# Patient Record
Sex: Male | Born: 1965
Health system: Midwestern US, Community
[De-identification: ages and names within clinical notes are randomized; demographics above are authoritative.]

## PROBLEM LIST (undated history)

## (undated) DIAGNOSIS — F4310 Post-traumatic stress disorder, unspecified: Secondary | ICD-10-CM

## (undated) DIAGNOSIS — K219 Gastro-esophageal reflux disease without esophagitis: Secondary | ICD-10-CM

## (undated) DIAGNOSIS — F329 Major depressive disorder, single episode, unspecified: Secondary | ICD-10-CM

## (undated) DIAGNOSIS — F32A Depression, unspecified: Secondary | ICD-10-CM

## (undated) DIAGNOSIS — N400 Enlarged prostate without lower urinary tract symptoms: Secondary | ICD-10-CM

## (undated) DIAGNOSIS — N419 Inflammatory disease of prostate, unspecified: Secondary | ICD-10-CM

## (undated) DIAGNOSIS — M961 Postlaminectomy syndrome, not elsewhere classified: Secondary | ICD-10-CM

## (undated) DIAGNOSIS — S32009A Unspecified fracture of unspecified lumbar vertebra, initial encounter for closed fracture: Principal | ICD-10-CM

## (undated) DIAGNOSIS — S56511A Strain of other extensor muscle, fascia and tendon at forearm level, right arm, initial encounter: Secondary | ICD-10-CM

## (undated) DIAGNOSIS — Z96642 Presence of left artificial hip joint: Principal | ICD-10-CM

## (undated) DIAGNOSIS — E785 Hyperlipidemia, unspecified: Principal | ICD-10-CM

## (undated) DIAGNOSIS — M87051 Idiopathic aseptic necrosis of right femur: Secondary | ICD-10-CM

## (undated) DIAGNOSIS — Z01812 Encounter for preprocedural laboratory examination: Principal | ICD-10-CM

## (undated) DIAGNOSIS — M87052 Idiopathic aseptic necrosis of left femur: Secondary | ICD-10-CM

## (undated) DIAGNOSIS — Z539 Procedure and treatment not carried out, unspecified reason: Secondary | ICD-10-CM

## (undated) DIAGNOSIS — Z981 Arthrodesis status: Secondary | ICD-10-CM

## (undated) DIAGNOSIS — Z01818 Encounter for other preprocedural examination: Secondary | ICD-10-CM

## (undated) DIAGNOSIS — M1612 Unilateral primary osteoarthritis, left hip: Secondary | ICD-10-CM

## (undated) DIAGNOSIS — M5020 Other cervical disc displacement, unspecified cervical region: Secondary | ICD-10-CM

## (undated) DIAGNOSIS — G5602 Carpal tunnel syndrome, left upper limb: Secondary | ICD-10-CM

## (undated) DIAGNOSIS — Z9889 Other specified postprocedural states: Principal | ICD-10-CM

## (undated) HISTORY — PX: JOINT REPLACEMENT: SHX530

## (undated) HISTORY — PX: KNEE SURGERY: SHX244

---

## 1998-06-10 ENCOUNTER — Encounter: Payer: Self-pay | Admitting: *Deleted

## 1998-06-10 ENCOUNTER — Emergency Department (HOSPITAL_COMMUNITY): Admission: EM | Admit: 1998-06-10 | Discharge: 1998-06-10 | Payer: Self-pay | Admitting: Emergency Medicine

## 1998-09-16 ENCOUNTER — Emergency Department (HOSPITAL_COMMUNITY): Admission: EM | Admit: 1998-09-16 | Discharge: 1998-09-16 | Payer: Self-pay | Admitting: Emergency Medicine

## 1998-09-27 ENCOUNTER — Emergency Department (HOSPITAL_COMMUNITY): Admission: EM | Admit: 1998-09-27 | Discharge: 1998-09-27 | Payer: Self-pay | Admitting: Emergency Medicine

## 1999-04-13 ENCOUNTER — Emergency Department (HOSPITAL_COMMUNITY): Admission: EM | Admit: 1999-04-13 | Discharge: 1999-04-13 | Payer: Self-pay | Admitting: Emergency Medicine

## 1999-04-14 ENCOUNTER — Encounter: Payer: Self-pay | Admitting: Emergency Medicine

## 1999-04-20 ENCOUNTER — Inpatient Hospital Stay (HOSPITAL_COMMUNITY): Admission: EM | Admit: 1999-04-20 | Discharge: 1999-04-22 | Payer: Self-pay | Admitting: Emergency Medicine

## 1999-04-20 ENCOUNTER — Encounter: Payer: Self-pay | Admitting: Cardiovascular Disease

## 1999-04-22 ENCOUNTER — Encounter: Payer: Self-pay | Admitting: Cardiovascular Disease

## 1999-08-14 ENCOUNTER — Ambulatory Visit (HOSPITAL_COMMUNITY): Admission: RE | Admit: 1999-08-14 | Discharge: 1999-08-14 | Payer: Self-pay | Admitting: *Deleted

## 1999-08-14 ENCOUNTER — Encounter (INDEPENDENT_AMBULATORY_CARE_PROVIDER_SITE_OTHER): Payer: Self-pay | Admitting: Specialist

## 2000-02-21 ENCOUNTER — Encounter: Payer: Self-pay | Admitting: Emergency Medicine

## 2000-02-21 ENCOUNTER — Emergency Department (HOSPITAL_COMMUNITY): Admission: EM | Admit: 2000-02-21 | Discharge: 2000-02-21 | Payer: Self-pay | Admitting: Emergency Medicine

## 2000-11-25 ENCOUNTER — Emergency Department (HOSPITAL_COMMUNITY): Admission: EM | Admit: 2000-11-25 | Discharge: 2000-11-25 | Payer: Self-pay

## 2001-04-30 ENCOUNTER — Emergency Department (HOSPITAL_COMMUNITY): Admission: EM | Admit: 2001-04-30 | Discharge: 2001-04-30 | Payer: Self-pay | Admitting: Physical Therapy

## 2001-11-27 ENCOUNTER — Emergency Department (HOSPITAL_COMMUNITY): Admission: EM | Admit: 2001-11-27 | Discharge: 2001-11-27 | Payer: Self-pay | Admitting: Emergency Medicine

## 2002-01-13 ENCOUNTER — Encounter: Payer: Self-pay | Admitting: Gastroenterology

## 2002-01-13 ENCOUNTER — Encounter: Admission: RE | Admit: 2002-01-13 | Discharge: 2002-01-13 | Payer: Self-pay | Admitting: Gastroenterology

## 2002-02-17 ENCOUNTER — Encounter: Admission: RE | Admit: 2002-02-17 | Discharge: 2002-02-17 | Payer: Self-pay | Admitting: Gastroenterology

## 2002-02-17 ENCOUNTER — Encounter: Payer: Self-pay | Admitting: Gastroenterology

## 2002-04-14 ENCOUNTER — Ambulatory Visit (HOSPITAL_COMMUNITY): Admission: RE | Admit: 2002-04-14 | Discharge: 2002-04-14 | Payer: Self-pay | Admitting: Gastroenterology

## 2002-04-14 ENCOUNTER — Encounter (INDEPENDENT_AMBULATORY_CARE_PROVIDER_SITE_OTHER): Payer: Self-pay

## 2002-08-15 ENCOUNTER — Emergency Department (HOSPITAL_COMMUNITY): Admission: EM | Admit: 2002-08-15 | Discharge: 2002-08-15 | Payer: Self-pay | Admitting: Emergency Medicine

## 2002-08-15 ENCOUNTER — Encounter: Payer: Self-pay | Admitting: Emergency Medicine

## 2002-08-30 ENCOUNTER — Encounter: Admission: RE | Admit: 2002-08-30 | Discharge: 2002-09-29 | Payer: Self-pay | Admitting: Occupational Medicine

## 2004-01-16 ENCOUNTER — Emergency Department (HOSPITAL_COMMUNITY): Admission: EM | Admit: 2004-01-16 | Discharge: 2004-01-16 | Payer: Self-pay | Admitting: *Deleted

## 2004-01-28 ENCOUNTER — Emergency Department (HOSPITAL_COMMUNITY): Admission: EM | Admit: 2004-01-28 | Discharge: 2004-01-28 | Payer: Self-pay | Admitting: Family Medicine

## 2004-12-04 ENCOUNTER — Emergency Department (HOSPITAL_COMMUNITY): Admission: EM | Admit: 2004-12-04 | Discharge: 2004-12-04 | Payer: Self-pay | Admitting: Emergency Medicine

## 2005-08-10 ENCOUNTER — Ambulatory Visit (HOSPITAL_COMMUNITY): Admission: RE | Admit: 2005-08-10 | Discharge: 2005-08-10 | Payer: Self-pay | Admitting: Family Medicine

## 2006-12-02 ENCOUNTER — Emergency Department (HOSPITAL_COMMUNITY): Admission: EM | Admit: 2006-12-02 | Discharge: 2006-12-02 | Payer: Self-pay | Admitting: Emergency Medicine

## 2009-01-25 ENCOUNTER — Emergency Department (HOSPITAL_COMMUNITY): Admission: EM | Admit: 2009-01-25 | Discharge: 2009-01-25 | Payer: Self-pay | Admitting: Emergency Medicine

## 2009-04-05 ENCOUNTER — Encounter: Admission: RE | Admit: 2009-04-05 | Discharge: 2009-05-15 | Payer: Self-pay | Admitting: *Deleted

## 2010-01-07 HISTORY — PX: ROTATOR CUFF REPAIR: SHX139

## 2010-01-10 ENCOUNTER — Encounter: Admission: RE | Admit: 2010-01-10 | Discharge: 2010-02-05 | Payer: Self-pay | Source: Home / Self Care

## 2010-01-28 ENCOUNTER — Encounter: Payer: Self-pay | Admitting: Family Medicine

## 2010-02-07 ENCOUNTER — Ambulatory Visit: Payer: Non-veteran care | Attending: Physician Assistant | Admitting: Physical Therapy

## 2010-02-07 DIAGNOSIS — M25569 Pain in unspecified knee: Secondary | ICD-10-CM | POA: Insufficient documentation

## 2010-02-07 DIAGNOSIS — R262 Difficulty in walking, not elsewhere classified: Secondary | ICD-10-CM | POA: Insufficient documentation

## 2010-02-07 DIAGNOSIS — IMO0001 Reserved for inherently not codable concepts without codable children: Secondary | ICD-10-CM | POA: Insufficient documentation

## 2010-02-07 DIAGNOSIS — M25669 Stiffness of unspecified knee, not elsewhere classified: Secondary | ICD-10-CM | POA: Insufficient documentation

## 2010-02-09 ENCOUNTER — Ambulatory Visit: Payer: Non-veteran care | Admitting: Physical Therapy

## 2010-02-12 ENCOUNTER — Ambulatory Visit: Payer: Non-veteran care | Admitting: Physical Therapy

## 2010-02-14 ENCOUNTER — Ambulatory Visit: Payer: Non-veteran care | Admitting: Physical Therapy

## 2010-02-16 ENCOUNTER — Ambulatory Visit: Payer: Non-veteran care | Admitting: Physical Therapy

## 2010-02-19 ENCOUNTER — Ambulatory Visit: Payer: Non-veteran care | Admitting: Physical Therapy

## 2010-02-21 ENCOUNTER — Encounter: Payer: Self-pay | Admitting: Physical Therapy

## 2010-02-21 ENCOUNTER — Encounter: Payer: Non-veteran care | Admitting: Physical Therapy

## 2010-02-23 ENCOUNTER — Ambulatory Visit: Payer: Non-veteran care | Admitting: Physical Therapy

## 2010-02-23 ENCOUNTER — Encounter: Payer: Self-pay | Admitting: Physical Therapy

## 2010-02-26 ENCOUNTER — Ambulatory Visit: Payer: Non-veteran care | Admitting: Physical Therapy

## 2010-03-01 ENCOUNTER — Ambulatory Visit: Payer: Non-veteran care | Admitting: Physical Therapy

## 2010-03-05 ENCOUNTER — Encounter: Payer: Non-veteran care | Admitting: Physical Therapy

## 2010-03-07 ENCOUNTER — Ambulatory Visit: Payer: Non-veteran care | Admitting: Physical Therapy

## 2010-05-25 NOTE — Op Note (Signed)
   NAME:  Walter Hicks, Walter Hicks                         ACCOUNT NO.:  [id number]   MEDICAL RECORD NO.:  [id number]                   PATIENT TYPE:  AMB   LOCATION:  ENDO                                 FACILITY:   PHYSICIAN:  Anselmo Rod, M.D.               DATE OF BIRTH:  May 13, 1965   DATE OF PROCEDURE:  04/14/2002  DATE OF DISCHARGE:                                 OPERATIVE REPORT   PROCEDURE:  Colonoscopy with biopsy.   ENDOSCOPIST:  Charna Elizabeth, M.D.   INSTRUMENT USED:  Olympus video colonoscope.   INDICATIONS FOR PROCEDURE:  A 45 year old [place] American male with history  of rectal bleeding, rule out colonic polyps, masses.  The patient has also  had some diarrhea, question IBD.   PREPROCEDURE PREPARATION:  Informed consent was procured from the patient.  The patient was fasted for eight hours prior to the procedure and prepped  with a bottle of magnesium citrate and a gallon of GoLYTELY the night prior  to the procedure.   PREPROCEDURE PHYSICAL:  VITAL SIGNS:  The patient had stable vital signs.  NECK:  Supple.  CHEST:  Clear to auscultation.  CARDIAC:  S1 and S2 regular.  ABDOMEN:  Soft with normal bowel sounds.   DESCRIPTION OF PROCEDURE:  The patient was placed in the left lateral  decubitus position and sedated with 100 mg of Demerol and 10 mg of Versed  intravenously.  Once the patient was adequately sedated, maintained on low  flow oxygen and continuous cardiac monitoring, the Olympus video colonoscope  was advanced from the rectum to the cecum without difficulty.  Patchy  erythematous area was seen in the rectum which was biopsied to rule out  proctitis.  The rest of the colon up to the terminal ileum appeared normal.  The appendiceal orifice and ileocecal valve were clearly visualized and  photographed.  There was no evidence of erosions in the terminal ileum.  No  masses, polyps, erosions, ulcerations were noticed throughout the colon  except in the rectum as  described above.   IMPRESSION:  1. Patchy erythema in the rectum, biopsied, results pending.  2. Otherwise normal colonoscopy to the terminal ileum.    RECOMMENDATIONS:  1. Await pathology results.  2. The patient will follow up in the next two weeks for further     recommendations.                                                Anselmo Rod, M.D.    JNM/MEDQ  D:  04/14/2002  T:  04/15/2002  Job:  [id number]   cc:   Olene Craven, M.D.  [address]  [address]  [place]  [place] [address]  Fax: [phone]

## 2010-05-25 NOTE — Procedures (Signed)
[place]  Patient:    MALE, MINISH                      MRN: [id number] Proc. Date: 08/14/99 Adm. Date:  [id number] Attending:  Sabino Gasser                           Procedure Report  PROCEDURE:  Upper endoscopy with biopsy.  ENDOSCOPIST:  Sabino Gasser, M.D.  INDICATIONS:  Dysphagia.  ANESTHESIA:  Demerol 25 mg and Versed 5 mg mg was given intravenously in divided dose.  DESCRIPTION OF PROCEDURE:  With the patient mildly sedated in the left lateral decubitus position, the Olympus video ________ endoscope was inserted and passed under direct vision to the esophagus which appeared normal.  The distal esophagus was approached.  Note, there are questionable areas of first segment Barretts esophagus and/or esophagitis, photographed.  We entered into the stomach.  The fundus, body, and antrum all appeared normal as did the duodenal bulb and second portion of the duodenum.  Photographs were taken.  From this point, the endoscope was slowly withdrawn, taking circumferential views of the entire duodenal mucosa, until the endoscope had been pulled back, and the stomach placed on retroflexion to view the stomach from below, and we able to withdraw the endoscope easily through the stomach and retroflexed position into the esophagus.  We pushed the scope back into the stomach.  The GE junction otherwise appeared normal and was photographed.  The endoscope was straightened and withdrawn, taking circumferential views of the remaining gastric and esophageal mucosa, __________ of the distal esophagus.  The patients vital signs and pulse oximeter remained stable.  The patient tolerated the procedure well without apparent complications.  FINDINGS:  Subjective decreased tone of lower esophageal sphincter with changes of esophagitis into a Barretts esophagus were a biopsy was _____. The patient is to call for the results and follow up with me for  results, biopsy, and manometry as an outpatient. DD:  08/14/99 TD:  08/15/99 Job: [id number]

## 2010-05-25 NOTE — Discharge Summary (Signed)
[place]  Patient:    Walter Hicks, Walter Hicks                      MRN: [id number] Adm. Date:  [id number] Disc. Date: [id number] Attending:  Ricki Rodriguez CC:         Eduardo Osier. Sharyn Lull, M.D.             Ricki Rodriguez, M.D.                           Discharge Summary  ADMISSION DIAGNOSES: 1. Recurrent chest pain, rule out coronary insufficiency, gastroesophageal    reflux disease. 2. Tobacco abuse. 3. Positive family history of coronary artery disease.  FINAL DIAGNOSES: 1. Chest pain, rule out gastroesophageal reflux disease. 2. Tobacco abuse. 3. Positive family history of coronary artery disease.  DISCHARGE HOME MEDICATIONS: 1. Prevacid 30 mg one capsule twice daily. 2. Celebrex 200 mg one capsule daily with food as needed.  ACTIVITY:  As tolerated.  DIET:  Low salt, low cholesterol.  FOLLOW-UP:  He will follow up with Ricki Rodriguez, M.D., in two weeks.  CONDITION AT DISCHARGE:  Stable.  BRIEF HISTORY AND HOSPITAL COURSE:  Mr. Donado is a 45 year old black male with past medical history significant for GERD, tobacco abuse, very strong family history of coronary artery disease.  His father died at the age of 60 due to MI, one sister died of MI at the age of 18.  One sister also has heart problems.  He was admitted via ER by Dr. Algie Coffer on April 20, 1999, because of retrosternal sharp chest pain radiating to the left arm after lifting a sofa. The pain lasted for two minutes. The chest pain restarted again after the episode of exertion so he decided to come to the ER.  The patient denies any nausea, vomiting, sweating, diaphoresis.  He denies PND, orthopnea, or leg swelling.  He denies palpitation, lightheadedness, dizziness or syncope.  EKG done in the ER showed normal sinus rhythm with nonspecific T-wave changes. The patient was admitted to telemetry unit.  LABS:  Two sets of cardiac enzymes were negative.  Two sets of CPKs  were negative.  Two sets of troponin I were negative.  Sodium 138, potassium 4.1, chloride 103, bicarb 28, BUN 15, creatinine 1.0.  BRIEF HOSPITAL COURSE:  The patient was admitted to telemetry where MI was ruled out by serial enzymes.  The patient underwent stress Cardiolite on April 22, 1999, exercised for 11 minutes on Bruce protocol with complaints of fatigue but no chest pain was reported.  Peak heart rate was 174, peak blood pressure was 160/80.  EKG showed normal sinus rhythm with no active ischemic changes noted and no arrhythmias were noted.  Cardiolite scan showed no evidence of ischemia or infarction.  Ejection fraction was 52%. The patient did not have any further episodes of chest pain during the hospital stay.  The patient was discharged to home in stable condition on the above medications. The patient was advised to follow up with Dr. Algie Coffer in two weeks. DD:  07/26/99 TD:  07/30/99 Job: [id number]

## 2010-07-31 ENCOUNTER — Ambulatory Visit: Payer: Non-veteran care | Attending: Physician Assistant | Admitting: Physical Therapy

## 2010-07-31 DIAGNOSIS — R262 Difficulty in walking, not elsewhere classified: Secondary | ICD-10-CM | POA: Insufficient documentation

## 2010-07-31 DIAGNOSIS — M25569 Pain in unspecified knee: Secondary | ICD-10-CM | POA: Insufficient documentation

## 2010-07-31 DIAGNOSIS — IMO0001 Reserved for inherently not codable concepts without codable children: Secondary | ICD-10-CM | POA: Insufficient documentation

## 2010-07-31 DIAGNOSIS — M25669 Stiffness of unspecified knee, not elsewhere classified: Secondary | ICD-10-CM | POA: Insufficient documentation

## 2010-08-01 ENCOUNTER — Ambulatory Visit: Payer: Non-veteran care | Admitting: Physical Therapy

## 2010-08-02 ENCOUNTER — Ambulatory Visit: Payer: Non-veteran care | Admitting: Physical Therapy

## 2010-08-06 ENCOUNTER — Ambulatory Visit: Payer: Non-veteran care | Admitting: Physical Therapy

## 2010-08-08 ENCOUNTER — Ambulatory Visit: Payer: Non-veteran care | Attending: Physician Assistant | Admitting: Physical Therapy

## 2010-08-08 DIAGNOSIS — IMO0001 Reserved for inherently not codable concepts without codable children: Secondary | ICD-10-CM | POA: Insufficient documentation

## 2010-08-08 DIAGNOSIS — M25569 Pain in unspecified knee: Secondary | ICD-10-CM | POA: Insufficient documentation

## 2010-08-08 DIAGNOSIS — R262 Difficulty in walking, not elsewhere classified: Secondary | ICD-10-CM | POA: Insufficient documentation

## 2010-08-08 DIAGNOSIS — M25669 Stiffness of unspecified knee, not elsewhere classified: Secondary | ICD-10-CM | POA: Insufficient documentation

## 2010-08-09 ENCOUNTER — Ambulatory Visit: Payer: Non-veteran care | Admitting: Physical Therapy

## 2010-08-13 ENCOUNTER — Ambulatory Visit: Payer: Non-veteran care | Admitting: Physical Therapy

## 2010-08-15 ENCOUNTER — Ambulatory Visit: Payer: Non-veteran care | Admitting: Physical Therapy

## 2010-08-20 ENCOUNTER — Ambulatory Visit: Payer: Non-veteran care | Admitting: Physical Therapy

## 2010-08-22 ENCOUNTER — Ambulatory Visit: Payer: Non-veteran care | Admitting: Physical Therapy

## 2010-08-27 ENCOUNTER — Ambulatory Visit: Payer: Non-veteran care | Admitting: Physical Therapy

## 2010-08-29 ENCOUNTER — Ambulatory Visit: Payer: Non-veteran care | Admitting: Physical Therapy

## 2010-09-03 ENCOUNTER — Ambulatory Visit: Payer: Non-veteran care | Admitting: Physical Therapy

## 2010-09-05 ENCOUNTER — Encounter: Payer: Non-veteran care | Admitting: Physical Therapy

## 2010-09-06 ENCOUNTER — Ambulatory Visit: Payer: Non-veteran care | Admitting: Physical Therapy

## 2010-09-12 ENCOUNTER — Ambulatory Visit: Payer: Non-veteran care | Attending: Physician Assistant | Admitting: Physical Therapy

## 2010-09-12 DIAGNOSIS — M25569 Pain in unspecified knee: Secondary | ICD-10-CM | POA: Insufficient documentation

## 2010-09-12 DIAGNOSIS — R262 Difficulty in walking, not elsewhere classified: Secondary | ICD-10-CM | POA: Insufficient documentation

## 2010-09-12 DIAGNOSIS — M25669 Stiffness of unspecified knee, not elsewhere classified: Secondary | ICD-10-CM | POA: Insufficient documentation

## 2010-09-12 DIAGNOSIS — IMO0001 Reserved for inherently not codable concepts without codable children: Secondary | ICD-10-CM | POA: Insufficient documentation

## 2010-09-13 ENCOUNTER — Ambulatory Visit: Payer: Non-veteran care | Admitting: Physical Therapy

## 2010-09-17 ENCOUNTER — Ambulatory Visit: Payer: Non-veteran care | Admitting: Physical Therapy

## 2010-09-18 ENCOUNTER — Encounter: Payer: Non-veteran care | Admitting: Physical Therapy

## 2010-09-19 ENCOUNTER — Ambulatory Visit: Payer: Non-veteran care | Admitting: Physical Therapy

## 2010-09-20 ENCOUNTER — Encounter: Payer: Non-veteran care | Admitting: Physical Therapy

## 2010-09-24 ENCOUNTER — Ambulatory Visit: Payer: Non-veteran care | Admitting: Physical Therapy

## 2010-09-25 ENCOUNTER — Ambulatory Visit: Payer: Non-veteran care | Admitting: Physical Therapy

## 2010-09-26 ENCOUNTER — Ambulatory Visit: Payer: Non-veteran care | Admitting: Physical Therapy

## 2010-10-01 ENCOUNTER — Ambulatory Visit: Payer: Non-veteran care | Admitting: Physical Therapy

## 2010-10-03 ENCOUNTER — Ambulatory Visit: Payer: Non-veteran care | Admitting: Physical Therapy

## 2010-10-04 ENCOUNTER — Ambulatory Visit: Payer: Non-veteran care | Admitting: Physical Therapy

## 2010-10-09 ENCOUNTER — Ambulatory Visit: Payer: Non-veteran care | Attending: Physician Assistant | Admitting: Physical Therapy

## 2010-10-09 DIAGNOSIS — R262 Difficulty in walking, not elsewhere classified: Secondary | ICD-10-CM | POA: Insufficient documentation

## 2010-10-09 DIAGNOSIS — M25669 Stiffness of unspecified knee, not elsewhere classified: Secondary | ICD-10-CM | POA: Insufficient documentation

## 2010-10-09 DIAGNOSIS — IMO0001 Reserved for inherently not codable concepts without codable children: Secondary | ICD-10-CM | POA: Insufficient documentation

## 2010-10-09 DIAGNOSIS — M25569 Pain in unspecified knee: Secondary | ICD-10-CM | POA: Insufficient documentation

## 2010-10-10 ENCOUNTER — Ambulatory Visit: Payer: Non-veteran care | Admitting: Physical Therapy

## 2010-10-11 ENCOUNTER — Ambulatory Visit: Payer: Non-veteran care | Admitting: Physical Therapy

## 2010-10-16 LAB — DIFFERENTIAL
Lymphocytes Relative: 39
Monocytes Absolute: 0.7
Monocytes Relative: 11
Neutro Abs: 3
Neutrophils Relative %: 47

## 2010-10-16 LAB — I-STAT 8, (EC8 V) (CONVERTED LAB)
BUN: 12
Bicarbonate: 25 — ABNORMAL HIGH
Hemoglobin: 15.6
Operator id: 257131
pCO2, Ven: 39.3 — ABNORMAL LOW

## 2010-10-16 LAB — SAMPLE TO BLOOD BANK

## 2010-10-16 LAB — OCCULT BLOOD X 1 CARD TO LAB, STOOL: Fecal Occult Bld: NEGATIVE

## 2010-10-16 LAB — CBC
HCT: 43.7
Platelets: 150

## 2011-10-03 ENCOUNTER — Ambulatory Visit: Payer: Non-veteran care | Attending: Sports Medicine | Admitting: Physical Therapy

## 2011-10-03 DIAGNOSIS — IMO0001 Reserved for inherently not codable concepts without codable children: Secondary | ICD-10-CM | POA: Insufficient documentation

## 2011-10-03 DIAGNOSIS — M25619 Stiffness of unspecified shoulder, not elsewhere classified: Secondary | ICD-10-CM | POA: Insufficient documentation

## 2011-10-03 DIAGNOSIS — M25519 Pain in unspecified shoulder: Secondary | ICD-10-CM | POA: Insufficient documentation

## 2011-10-08 ENCOUNTER — Ambulatory Visit: Payer: Non-veteran care | Attending: Sports Medicine | Admitting: Physical Therapy

## 2011-10-08 DIAGNOSIS — M25519 Pain in unspecified shoulder: Secondary | ICD-10-CM | POA: Insufficient documentation

## 2011-10-08 DIAGNOSIS — M25619 Stiffness of unspecified shoulder, not elsewhere classified: Secondary | ICD-10-CM | POA: Insufficient documentation

## 2011-10-08 DIAGNOSIS — IMO0001 Reserved for inherently not codable concepts without codable children: Secondary | ICD-10-CM | POA: Insufficient documentation

## 2011-10-11 ENCOUNTER — Ambulatory Visit: Payer: Non-veteran care | Admitting: Physical Therapy

## 2011-10-15 ENCOUNTER — Ambulatory Visit: Payer: Non-veteran care | Admitting: Physical Therapy

## 2011-10-18 ENCOUNTER — Ambulatory Visit: Payer: Non-veteran care | Admitting: Physical Therapy

## 2011-10-22 ENCOUNTER — Ambulatory Visit: Payer: Non-veteran care | Admitting: Physical Therapy

## 2011-10-25 ENCOUNTER — Ambulatory Visit: Payer: Non-veteran care | Admitting: Physical Therapy

## 2011-10-29 ENCOUNTER — Ambulatory Visit: Payer: Non-veteran care | Admitting: Physical Therapy

## 2011-11-01 ENCOUNTER — Ambulatory Visit: Payer: Non-veteran care

## 2011-11-05 ENCOUNTER — Ambulatory Visit: Payer: Non-veteran care | Admitting: Physical Therapy

## 2011-11-07 ENCOUNTER — Ambulatory Visit: Payer: Non-veteran care | Admitting: Physical Therapy

## 2011-11-08 ENCOUNTER — Encounter: Payer: Non-veteran care | Admitting: Physical Therapy

## 2011-11-12 ENCOUNTER — Ambulatory Visit: Payer: Non-veteran care | Attending: Nurse Practitioner | Admitting: Physical Therapy

## 2011-11-12 DIAGNOSIS — IMO0001 Reserved for inherently not codable concepts without codable children: Secondary | ICD-10-CM | POA: Insufficient documentation

## 2011-11-12 DIAGNOSIS — M25519 Pain in unspecified shoulder: Secondary | ICD-10-CM | POA: Insufficient documentation

## 2011-11-12 DIAGNOSIS — M25619 Stiffness of unspecified shoulder, not elsewhere classified: Secondary | ICD-10-CM | POA: Insufficient documentation

## 2011-11-14 ENCOUNTER — Ambulatory Visit: Payer: Non-veteran care | Admitting: Physical Therapy

## 2011-11-19 ENCOUNTER — Ambulatory Visit: Payer: Non-veteran care | Admitting: Physical Therapy

## 2011-11-21 ENCOUNTER — Ambulatory Visit: Payer: Non-veteran care | Admitting: Physical Therapy

## 2011-11-26 ENCOUNTER — Ambulatory Visit: Payer: Non-veteran care | Admitting: Physical Therapy

## 2011-11-28 ENCOUNTER — Ambulatory Visit: Payer: Non-veteran care | Admitting: Physical Therapy

## 2011-12-03 ENCOUNTER — Ambulatory Visit: Payer: Non-veteran care | Admitting: Physical Therapy

## 2012-02-13 ENCOUNTER — Ambulatory Visit: Payer: Non-veteran care | Admitting: Sports Medicine

## 2012-09-09 ENCOUNTER — Encounter (HOSPITAL_BASED_OUTPATIENT_CLINIC_OR_DEPARTMENT_OTHER): Payer: Self-pay | Admitting: Student

## 2012-09-09 ENCOUNTER — Emergency Department (HOSPITAL_BASED_OUTPATIENT_CLINIC_OR_DEPARTMENT_OTHER)
Admission: EM | Admit: 2012-09-09 | Discharge: 2012-09-09 | Disposition: A | Discharge status: Home / Self Care | Payer: Medicare Other | Attending: Emergency Medicine | Admitting: Emergency Medicine

## 2012-09-09 ENCOUNTER — Emergency Department (HOSPITAL_BASED_OUTPATIENT_CLINIC_OR_DEPARTMENT_OTHER): Payer: Medicare Other

## 2012-09-09 DIAGNOSIS — F329 Major depressive disorder, single episode, unspecified: Secondary | ICD-10-CM | POA: Insufficient documentation

## 2012-09-09 DIAGNOSIS — Z87448 Personal history of other diseases of urinary system: Secondary | ICD-10-CM | POA: Insufficient documentation

## 2012-09-09 DIAGNOSIS — Y9301 Activity, walking, marching and hiking: Secondary | ICD-10-CM | POA: Insufficient documentation

## 2012-09-09 DIAGNOSIS — F3289 Other specified depressive episodes: Secondary | ICD-10-CM | POA: Insufficient documentation

## 2012-09-09 DIAGNOSIS — Z9889 Other specified postprocedural states: Secondary | ICD-10-CM | POA: Insufficient documentation

## 2012-09-09 DIAGNOSIS — S5001XA Contusion of right elbow, initial encounter: Secondary | ICD-10-CM

## 2012-09-09 DIAGNOSIS — K219 Gastro-esophageal reflux disease without esophagitis: Secondary | ICD-10-CM | POA: Insufficient documentation

## 2012-09-09 DIAGNOSIS — W1809XA Striking against other object with subsequent fall, initial encounter: Secondary | ICD-10-CM | POA: Insufficient documentation

## 2012-09-09 DIAGNOSIS — Z79899 Other long term (current) drug therapy: Secondary | ICD-10-CM | POA: Insufficient documentation

## 2012-09-09 DIAGNOSIS — W108XXA Fall (on) (from) other stairs and steps, initial encounter: Secondary | ICD-10-CM | POA: Insufficient documentation

## 2012-09-09 DIAGNOSIS — Y929 Unspecified place or not applicable: Secondary | ICD-10-CM | POA: Insufficient documentation

## 2012-09-09 DIAGNOSIS — M25449 Effusion, unspecified hand: Secondary | ICD-10-CM | POA: Insufficient documentation

## 2012-09-09 DIAGNOSIS — S60229A Contusion of unspecified hand, initial encounter: Secondary | ICD-10-CM | POA: Insufficient documentation

## 2012-09-09 DIAGNOSIS — S5000XA Contusion of unspecified elbow, initial encounter: Secondary | ICD-10-CM | POA: Insufficient documentation

## 2012-09-09 DIAGNOSIS — Z7982 Long term (current) use of aspirin: Secondary | ICD-10-CM | POA: Insufficient documentation

## 2012-09-09 DIAGNOSIS — S60222A Contusion of left hand, initial encounter: Secondary | ICD-10-CM

## 2012-09-09 DIAGNOSIS — S8990XA Unspecified injury of unspecified lower leg, initial encounter: Secondary | ICD-10-CM | POA: Insufficient documentation

## 2012-09-09 HISTORY — DX: Gastro-esophageal reflux disease without esophagitis: K21.9

## 2012-09-09 HISTORY — DX: Major depressive disorder, single episode, unspecified: F32.9

## 2012-09-09 HISTORY — DX: Post-traumatic stress disorder, unspecified: F43.10

## 2012-09-09 HISTORY — DX: Benign prostatic hyperplasia without lower urinary tract symptoms: N40.0

## 2012-09-09 HISTORY — DX: Depression, unspecified: F32.A

## 2012-09-09 NOTE — ED Notes (Signed)
Patient transported to X-ray 

## 2012-09-09 NOTE — ED Notes (Signed)
MD at bedside. 

## 2012-09-09 NOTE — ED Notes (Signed)
Pt reports falling forward while ambulating and hitting left knee, hand and right elbow in fall.

## 2012-09-09 NOTE — ED Provider Notes (Signed)
CSN: [id number]     Arrival date & time 09/09/12  2105 History   First MD Initiated Contact with Patient 09/09/12 2242     Chief Complaint  Patient presents with  . Knee Pain    left  . Hand Injury    left  . Joint Swelling    right   (Consider location/radiation/quality/duration/timing/severity/associated sxs/prior Treatment) Patient is a 47 y.o. male presenting with knee pain and hand injury.  Knee Pain Hand Injury  Pt with history of bilateral knee arthroplasty recently had a revision on the left. He was walking down some stairs today when his left knee gave out and he fell backwards, striking his R elbow on the steps and landing on outstretched L wrist. Complaining of moderate aching pain to L hand and R elbow, worse with movement. Associated with some tingling in L 4th and 5th fingers. No head injury. L knee is uninjured as well.   Past Medical History  Diagnosis Date  . GERD (gastroesophageal reflux disease)   . BPH (benign prostatic hyperplasia)   . PTSD (post-traumatic stress disorder)   . Depression    Past Surgical History  Procedure Laterality Date  . Knee surgery    . Joint replacement Bilateral    History reviewed. No pertinent family history. History  Substance Use Topics  . Smoking status: Never Smoker   . Smokeless tobacco: Not on file  . Alcohol Use: Yes    Review of Systems All other systems reviewed and are negative except as noted in HPI.   Allergies  Imitrex; Indomethacin; and Dilaudid  Home Medications   Current Outpatient Rx  Name  Route  Sig  Dispense  Refill  . aspirin 325 MG EC tablet   Oral   Take 325 mg by mouth daily.         . fish oil-omega-3 fatty acids 1000 MG capsule   Oral   Take 2 g by mouth daily.         . Multiple Vitamin (MULTIVITAMIN) tablet   Oral   Take 1 tablet by mouth daily.         Marland Kitchen oxybutynin (DITROPAN) 5 MG tablet   Oral   Take 5 mg by mouth 3 (three) times daily.         . pantoprazole  (PROTONIX) 40 MG tablet   Oral   Take 40 mg by mouth daily.         . ranitidine (ZANTAC) 150 MG tablet   Oral   Take 150 mg by mouth 2 (two) times daily.         Marland Kitchen venlafaxine (EFFEXOR) 75 MG tablet   Oral   Take 75 mg by mouth 2 (two) times daily.          BP 122/76  Pulse 91  Temp(Src) 98.5 F (36.9 C) (Oral)  Resp 20  Wt 240 lb (108.863 kg)  SpO2 97% Physical Exam  Nursing note and vitals reviewed. Constitutional: He is oriented to person, place, and time. He appears well-developed and well-nourished.  HENT:  Head: Normocephalic and atraumatic.  Eyes: EOM are normal. Pupils are equal, round, and reactive to light.  Neck: Normal range of motion. Neck supple.  Cardiovascular: Normal rate, normal heart sounds and intact distal pulses.   Pulmonary/Chest: Effort normal and breath sounds normal.  Abdominal: Bowel sounds are normal. He exhibits no distension. There is no tenderness.  Musculoskeletal: Normal range of motion. He exhibits tenderness (R lateral epicondyle on elbow,  L 5th metacarpal; healing incision of L knee with mild effusion, non-tender). He exhibits no edema.  Neurological: He is alert and oriented to person, place, and time. He has normal strength. No cranial nerve deficit or sensory deficit.  Skin: Skin is warm and dry. No rash noted.  Psychiatric: He has a normal mood and affect.    ED Course  Procedures (including critical care time) Labs Review Labs Reviewed - No data to display Imaging Review Dg Elbow Complete Right  09/09/2012   *RADIOLOGY REPORT*  Clinical Data: Fall, elbow injury  RIGHT ELBOW - COMPLETE 3+ VIEW  Comparison: None.  Findings: There is no acute fracture or dislocation. Small degenerative spur is noted at the olecranon.  No soft tissue swelling or other abnormality.  No radiopaque foreign body.  IMPRESSION: No acute fracture or dislocation.   Original Report Authenticated By: Rise Mu, M.D.   Dg Hand Complete  Left  09/09/2012   *RADIOLOGY REPORT*  Clinical Data: Hand injury, pain in fourth and fifth metacarpals.  LEFT HAND - COMPLETE 3+ VIEW  Comparison: None.  Findings: No acute fracture or dislocation is identified.  There is mild foreshortening of the fourth metacarpal.  Joint spaces are preserved.  No soft tissue abnormality.  No radiopaque foreign body.  IMPRESSION: Normal radiograph of the left hand with no acute osseous abnormality identified.   Original Report Authenticated By: Rise Mu, M.D.    MDM   1. Contusion of left hand, initial encounter   2. Contusion of right elbow, initial encounter     Imaging negative. Pt has pain medications at home. Advised rest, ice and elevation.     Donette Mainwaring B. Bernette Mayers, MD 09/09/12 2303

## 2012-09-17 ENCOUNTER — Ambulatory Visit: Payer: Non-veteran care | Attending: Family Medicine | Admitting: Rehabilitation

## 2012-09-17 DIAGNOSIS — M25569 Pain in unspecified knee: Secondary | ICD-10-CM | POA: Insufficient documentation

## 2012-09-17 DIAGNOSIS — IMO0001 Reserved for inherently not codable concepts without codable children: Secondary | ICD-10-CM | POA: Insufficient documentation

## 2012-09-22 ENCOUNTER — Encounter: Payer: Non-veteran care | Admitting: Rehabilitation

## 2012-09-24 ENCOUNTER — Encounter: Payer: Non-veteran care | Admitting: Rehabilitation

## 2012-09-28 ENCOUNTER — Encounter: Payer: Non-veteran care | Admitting: Rehabilitation

## 2012-10-01 ENCOUNTER — Encounter: Payer: Non-veteran care | Admitting: Rehabilitation

## 2012-10-03 ENCOUNTER — Emergency Department (HOSPITAL_COMMUNITY): Payer: Non-veteran care

## 2012-10-03 ENCOUNTER — Encounter (HOSPITAL_COMMUNITY): Payer: Self-pay | Admitting: *Deleted

## 2012-10-03 ENCOUNTER — Emergency Department (HOSPITAL_COMMUNITY)
Admission: EM | Admit: 2012-10-03 | Discharge: 2012-10-03 | Disposition: A | Discharge status: Home / Self Care | Payer: Non-veteran care | Attending: Emergency Medicine | Admitting: Emergency Medicine

## 2012-10-03 DIAGNOSIS — R350 Frequency of micturition: Secondary | ICD-10-CM | POA: Insufficient documentation

## 2012-10-03 DIAGNOSIS — R339 Retention of urine, unspecified: Secondary | ICD-10-CM | POA: Insufficient documentation

## 2012-10-03 DIAGNOSIS — Z79899 Other long term (current) drug therapy: Secondary | ICD-10-CM | POA: Insufficient documentation

## 2012-10-03 DIAGNOSIS — Z7982 Long term (current) use of aspirin: Secondary | ICD-10-CM | POA: Insufficient documentation

## 2012-10-03 DIAGNOSIS — R300 Dysuria: Secondary | ICD-10-CM

## 2012-10-03 DIAGNOSIS — F3289 Other specified depressive episodes: Secondary | ICD-10-CM | POA: Insufficient documentation

## 2012-10-03 DIAGNOSIS — R3915 Urgency of urination: Secondary | ICD-10-CM | POA: Insufficient documentation

## 2012-10-03 DIAGNOSIS — F329 Major depressive disorder, single episode, unspecified: Secondary | ICD-10-CM | POA: Insufficient documentation

## 2012-10-03 DIAGNOSIS — K219 Gastro-esophageal reflux disease without esophagitis: Secondary | ICD-10-CM | POA: Insufficient documentation

## 2012-10-03 DIAGNOSIS — Z87448 Personal history of other diseases of urinary system: Secondary | ICD-10-CM | POA: Insufficient documentation

## 2012-10-03 HISTORY — DX: Inflammatory disease of prostate, unspecified: N41.9

## 2012-10-03 LAB — COMPREHENSIVE METABOLIC PANEL
BUN: 12 mg/dL (ref 6–23)
CO2: 27 mEq/L (ref 19–32)
Calcium: 9.7 mg/dL (ref 8.4–10.5)
Creatinine, Ser: 1.05 mg/dL (ref 0.50–1.35)
GFR calc Af Amer: >90 mL/min (ref >90)
GFR calc non Af Amer: 83 mL/min — ABNORMAL LOW (ref >90)
Glucose, Bld: 118 mg/dL — ABNORMAL HIGH (ref 70–99)

## 2012-10-03 LAB — URINALYSIS, ROUTINE W REFLEX MICROSCOPIC
Nitrite: NEGATIVE
Protein, ur: NEGATIVE mg/dL
Specific Gravity, Urine: 1.028 (ref 1.005–1.030)
Urobilinogen, UA: 1 mg/dL (ref 0.0–1.0)

## 2012-10-03 LAB — CBC WITH DIFFERENTIAL/PLATELET
Eosinophils Relative: 5 % (ref 0–5)
HCT: 45.8 % (ref 39.0–52.0)
Hemoglobin: 16.1 g/dL (ref 13.0–17.0)
Lymphocytes Relative: 42 % (ref 12–46)
Lymphs Abs: 2.3 10*3/uL (ref 0.7–4.0)
MCV: 92.2 fL (ref 78.0–100.0)
Monocytes Absolute: 0.5 10*3/uL (ref 0.1–1.0)
Monocytes Relative: 9 % (ref 3–12)
RBC: 4.97 MIL/uL (ref 4.22–5.81)
RDW: 12.2 % (ref 11.5–15.5)
WBC: 5.6 10*3/uL (ref 4.0–10.5)

## 2012-10-03 MED ORDER — MORPHINE SULFATE 4 MG/ML IJ SOLN
4.0000 mg | Freq: Once | INTRAMUSCULAR | Status: AC
  Administered 2012-10-03: 4 mg via INTRAMUSCULAR
  Filled 2012-10-03: qty 1

## 2012-10-03 MED ORDER — OXYCODONE-ACETAMINOPHEN 5-325 MG PO TABS
2.0000 | ORAL_TABLET | Freq: Once | ORAL | Status: AC
  Administered 2012-10-03: 2 via ORAL
  Filled 2012-10-03: qty 2

## 2012-10-03 NOTE — ED Notes (Signed)
Contacted ultrasound about wait time. It will be another hour appx until pt will seen. Pt and MD made aware.

## 2012-10-03 NOTE — ED Provider Notes (Signed)
Reviewed the ultrasound, which reveals no cause for the patient's symptoms.  He is been discharged home with urology, followup.  I've explained the results to the patient and he understands the need for followup  Arman Filter, NP 10/03/12 2231

## 2012-10-03 NOTE — ED Provider Notes (Signed)
CSN: [id number]     Arrival date & time 10/03/12  1509 History   First MD Initiated Contact with Patient 10/03/12 1759     Chief Complaint  Patient presents with  . Abdominal Pain  . Urinary Retention   (Consider location/radiation/quality/duration/timing/severity/associated sxs/prior Treatment) Patient is a 47 y.o. male presenting with abdominal pain. The history is provided by the patient.  Abdominal Pain Pain location:  Suprapubic Associated symptoms: no chest pain, no diarrhea, no nausea, no shortness of breath and no vomiting    patient presents with suprapubic abdominal pain. He also has had difficulty urinating and some hesitancy. He states he does not feel like he empties his bladder sometimes. No fevers. He does have some right-sided testicular pain. No masses. No trauma. He is previously been diagnosed with a enlarged prostate. He also said previous prostatitis. His been followed at the [place].  Past Medical History  Diagnosis Date  . GERD (gastroesophageal reflux disease)   . BPH (benign prostatic hyperplasia)   . PTSD (post-traumatic stress disorder)   . Depression   . Prostatitis    Past Surgical History  Procedure Laterality Date  . Knee surgery    . Joint replacement Bilateral    History reviewed. No pertinent family history. History  Substance Use Topics  . Smoking status: Never Smoker   . Smokeless tobacco: Not on file  . Alcohol Use: Yes    Review of Systems  Constitutional: Negative for activity change and appetite change.  HENT: Negative for neck stiffness.   Eyes: Negative for pain.  Respiratory: Negative for chest tightness and shortness of breath.   Cardiovascular: Negative for chest pain and leg swelling.  Gastrointestinal: Positive for abdominal pain. Negative for nausea, vomiting and diarrhea.  Genitourinary: Positive for urgency, frequency and difficulty urinating. Negative for flank pain, decreased urine volume, discharge, penile swelling, scrotal  swelling and testicular pain.  Musculoskeletal: Negative for back pain.  Skin: Negative for rash.  Neurological: Negative for weakness, numbness and headaches.  Psychiatric/Behavioral: Negative for behavioral problems.    Allergies  Imitrex; Indomethacin; Relpax; and Dilaudid  Home Medications   Current Outpatient Rx  Name  Route  Sig  Dispense  Refill  . aspirin 325 MG EC tablet   Oral   Take 325 mg by mouth daily.         . busPIRone (BUSPAR) 10 MG tablet   Oral   Take 10 mg by mouth at bedtime.         . fish oil-omega-3 fatty acids 1000 MG capsule   Oral   Take 1 g by mouth 2 (two) times daily.          . Multiple Vitamin (MULTIVITAMIN WITH MINERALS) TABS tablet   Oral   Take 1 tablet by mouth daily.         Marland Kitchen oxybutynin (DITROPAN) 5 MG tablet   Oral   Take 5 mg by mouth at bedtime.          Marland Kitchen oxyCODONE-acetaminophen (PERCOCET/ROXICET) 5-325 MG per tablet   Oral   Take 1-3 tablets by mouth every 4 (four) hours as needed for pain.         . pantoprazole (PROTONIX) 40 MG tablet   Oral   Take 40 mg by mouth daily.         . ranitidine (ZANTAC) 150 MG tablet   Oral   Take 150 mg by mouth 2 (two) times daily.         Marland Kitchen  venlafaxine XR (EFFEXOR-XR) 75 MG 24 hr capsule   Oral   Take 75 mg by mouth at bedtime.          BP 116/71  Pulse 71  Temp(Src) 98.5 F (36.9 C) (Oral)  Resp 24  SpO2 100% Physical Exam  Nursing note and vitals reviewed. Constitutional: He is oriented to person, place, and time. He appears well-developed and well-nourished.  HENT:  Head: Normocephalic and atraumatic.  Eyes: EOM are normal. Pupils are equal, round, and reactive to light.  Neck: Normal range of motion. Neck supple.  Cardiovascular: Normal rate, regular rhythm and normal heart sounds.   No murmur heard. Pulmonary/Chest: Effort normal and breath sounds normal.  Abdominal: Soft. Bowel sounds are normal. He exhibits no distension and no mass. There is  tenderness. There is no rebound and no guarding.  Mild suprapubic tenderness. No masses. No rebound or guarding.  Genitourinary: Penis normal.  Mild tenderness to right testicle. No mass palpated. Normal lie.  Musculoskeletal: Normal range of motion. He exhibits no edema.  Neurological: He is alert and oriented to person, place, and time. No cranial nerve deficit.  Skin: Skin is warm and dry.  Psychiatric: He has a normal mood and affect.    ED Course  Procedures (including critical care time) Labs Review Labs Reviewed  COMPREHENSIVE METABOLIC PANEL - Abnormal; Notable for the following:    Glucose, Bld 118 (*)    GFR calc non Af Amer 83 (*)    All other components within normal limits  CBC WITH DIFFERENTIAL  URINALYSIS, ROUTINE W REFLEX MICROSCOPIC   Imaging Review No results found.  MDM  No diagnosis found. Patient with dysuria. Some frequency and hesitancy. That work and urine is reassuring. Ultrasound pending at this time. Will need urology followup.    Juliet Rude. Rubin Payor, MD 10/03/12 2024

## 2012-10-03 NOTE — ED Notes (Signed)
Pt reports having problems with prostate for extended amount of time, has been to [facility] hospital for it. Now having lower abd pain that is more severe and difficulty urinating.

## 2012-10-03 NOTE — ED Notes (Signed)
Pt reports bilateral testicular pain x "several weeks" that is worse today. Tender on palpation. Pt also reports difficulty urinating. Able to void today. Per Md bladder scan, pt has empty bladder.

## 2012-10-04 NOTE — ED Provider Notes (Signed)
Medical screening examination/treatment/procedure(s) were conducted as a shared visit with non-physician practitioner(s) and myself.  I personally evaluated the patient during the encounter  Walter Hicks. Rubin Payor, MD 10/04/12 1104

## 2012-10-05 ENCOUNTER — Encounter: Payer: Non-veteran care | Admitting: Rehabilitation

## 2012-10-08 ENCOUNTER — Encounter: Payer: Non-veteran care | Admitting: Rehabilitation

## 2012-10-12 ENCOUNTER — Encounter: Payer: Non-veteran care | Admitting: Rehabilitation

## 2012-10-15 ENCOUNTER — Encounter: Payer: Non-veteran care | Admitting: Rehabilitation

## 2012-10-19 ENCOUNTER — Encounter: Payer: Non-veteran care | Admitting: Rehabilitation

## 2012-10-22 ENCOUNTER — Encounter: Payer: Non-veteran care | Admitting: Rehabilitation

## 2012-10-26 ENCOUNTER — Encounter: Payer: Non-veteran care | Admitting: Rehabilitation

## 2012-10-29 ENCOUNTER — Encounter: Payer: Non-veteran care | Admitting: Rehabilitation

## 2013-06-09 ENCOUNTER — Ambulatory Visit: Payer: Non-veteran care | Admitting: Physical Therapy

## 2013-06-21 ENCOUNTER — Ambulatory Visit: Payer: Non-veteran care | Attending: Orthopaedic Surgery | Admitting: Physical Therapy

## 2013-06-21 DIAGNOSIS — Z96659 Presence of unspecified artificial knee joint: Secondary | ICD-10-CM | POA: Insufficient documentation

## 2013-06-21 DIAGNOSIS — R262 Difficulty in walking, not elsewhere classified: Secondary | ICD-10-CM | POA: Diagnosis not present

## 2013-06-21 DIAGNOSIS — M25669 Stiffness of unspecified knee, not elsewhere classified: Secondary | ICD-10-CM | POA: Diagnosis not present

## 2013-06-21 DIAGNOSIS — R609 Edema, unspecified: Secondary | ICD-10-CM | POA: Insufficient documentation

## 2013-06-21 DIAGNOSIS — IMO0001 Reserved for inherently not codable concepts without codable children: Secondary | ICD-10-CM | POA: Insufficient documentation

## 2013-06-21 DIAGNOSIS — M25569 Pain in unspecified knee: Secondary | ICD-10-CM | POA: Insufficient documentation

## 2013-06-23 ENCOUNTER — Ambulatory Visit: Payer: Non-veteran care | Admitting: Physical Therapy

## 2013-06-23 DIAGNOSIS — IMO0001 Reserved for inherently not codable concepts without codable children: Secondary | ICD-10-CM | POA: Diagnosis not present

## 2013-06-29 ENCOUNTER — Ambulatory Visit: Payer: Non-veteran care | Admitting: Rehabilitation

## 2013-06-30 ENCOUNTER — Ambulatory Visit: Payer: Non-veteran care | Admitting: Rehabilitation

## 2013-07-01 ENCOUNTER — Ambulatory Visit: Payer: Non-veteran care | Admitting: Rehabilitation

## 2013-07-01 DIAGNOSIS — IMO0001 Reserved for inherently not codable concepts without codable children: Secondary | ICD-10-CM | POA: Diagnosis not present

## 2013-07-06 ENCOUNTER — Ambulatory Visit: Payer: Non-veteran care | Admitting: Rehabilitation

## 2013-07-08 ENCOUNTER — Ambulatory Visit: Payer: Non-veteran care | Attending: Orthopaedic Surgery | Admitting: Rehabilitation

## 2013-07-08 DIAGNOSIS — R262 Difficulty in walking, not elsewhere classified: Secondary | ICD-10-CM | POA: Insufficient documentation

## 2013-07-08 DIAGNOSIS — Z96659 Presence of unspecified artificial knee joint: Secondary | ICD-10-CM | POA: Insufficient documentation

## 2013-07-08 DIAGNOSIS — M25569 Pain in unspecified knee: Secondary | ICD-10-CM | POA: Insufficient documentation

## 2013-07-08 DIAGNOSIS — R609 Edema, unspecified: Secondary | ICD-10-CM | POA: Insufficient documentation

## 2013-07-08 DIAGNOSIS — M25669 Stiffness of unspecified knee, not elsewhere classified: Secondary | ICD-10-CM | POA: Insufficient documentation

## 2013-07-08 DIAGNOSIS — IMO0001 Reserved for inherently not codable concepts without codable children: Secondary | ICD-10-CM | POA: Insufficient documentation

## 2013-07-13 ENCOUNTER — Ambulatory Visit: Payer: Non-veteran care | Admitting: Rehabilitation

## 2013-07-13 DIAGNOSIS — M25569 Pain in unspecified knee: Secondary | ICD-10-CM | POA: Diagnosis not present

## 2013-07-13 DIAGNOSIS — Z96659 Presence of unspecified artificial knee joint: Secondary | ICD-10-CM | POA: Diagnosis not present

## 2013-07-13 DIAGNOSIS — R609 Edema, unspecified: Secondary | ICD-10-CM | POA: Diagnosis not present

## 2013-07-13 DIAGNOSIS — R262 Difficulty in walking, not elsewhere classified: Secondary | ICD-10-CM | POA: Diagnosis not present

## 2013-07-13 DIAGNOSIS — IMO0001 Reserved for inherently not codable concepts without codable children: Secondary | ICD-10-CM | POA: Diagnosis not present

## 2013-07-13 DIAGNOSIS — M25669 Stiffness of unspecified knee, not elsewhere classified: Secondary | ICD-10-CM | POA: Diagnosis not present

## 2013-07-15 ENCOUNTER — Ambulatory Visit: Payer: Non-veteran care | Admitting: Rehabilitation

## 2013-07-15 DIAGNOSIS — IMO0001 Reserved for inherently not codable concepts without codable children: Secondary | ICD-10-CM | POA: Diagnosis not present

## 2014-04-08 ENCOUNTER — Other Ambulatory Visit: Payer: Self-pay

## 2014-04-08 DIAGNOSIS — I83813 Varicose veins of bilateral lower extremities with pain: Secondary | ICD-10-CM

## 2014-04-27 ENCOUNTER — Other Ambulatory Visit: Payer: Self-pay | Admitting: Internal Medicine

## 2014-04-27 DIAGNOSIS — I82402 Acute embolism and thrombosis of unspecified deep veins of left lower extremity: Secondary | ICD-10-CM

## 2014-04-28 ENCOUNTER — Ambulatory Visit
Admission: RE | Admit: 2014-04-28 | Discharge: 2014-04-28 | Disposition: A | Discharge status: Home / Self Care | Payer: Non-veteran care | Source: Ambulatory Visit | Attending: Internal Medicine | Admitting: Internal Medicine

## 2014-04-28 DIAGNOSIS — I82402 Acute embolism and thrombosis of unspecified deep veins of left lower extremity: Secondary | ICD-10-CM

## 2014-04-29 ENCOUNTER — Other Ambulatory Visit: Payer: Self-pay | Admitting: Internal Medicine

## 2014-04-29 DIAGNOSIS — M79661 Pain in right lower leg: Secondary | ICD-10-CM

## 2014-05-02 ENCOUNTER — Encounter: Payer: Self-pay | Admitting: Vascular Surgery

## 2014-05-03 ENCOUNTER — Encounter: Payer: Self-pay | Admitting: Vascular Surgery

## 2014-05-03 ENCOUNTER — Ambulatory Visit (INDEPENDENT_AMBULATORY_CARE_PROVIDER_SITE_OTHER): Payer: Medicare Other | Admitting: Vascular Surgery

## 2014-05-03 ENCOUNTER — Ambulatory Visit (HOSPITAL_COMMUNITY)
Admission: RE | Admit: 2014-05-03 | Discharge: 2014-05-03 | Disposition: A | Discharge status: Home / Self Care | Payer: Medicare Other | Source: Ambulatory Visit | Attending: Vascular Surgery | Admitting: Vascular Surgery

## 2014-05-03 VITALS — BP 107/76 | HR 101 | Ht 74.0 in | Wt 267.0 lb

## 2014-05-03 DIAGNOSIS — I83893 Varicose veins of bilateral lower extremities with other complications: Secondary | ICD-10-CM | POA: Diagnosis not present

## 2014-05-03 DIAGNOSIS — I83813 Varicose veins of bilateral lower extremities with pain: Secondary | ICD-10-CM

## 2014-05-03 DIAGNOSIS — I83899 Varicose veins of unspecified lower extremities with other complications: Secondary | ICD-10-CM | POA: Insufficient documentation

## 2014-05-03 NOTE — Progress Notes (Signed)
Subjective:     Patient ID: Walter Hicks, male   DOB: [birth year], 49 y.o.   MRN: [id number]  HPI this 49 year old male was referred by Dr. Andi DevonKimberly Shelton for evaluation of recent acute thrombophlebitis in the right thigh and painful varicosities in the left leg. Patient has had varicose veins for several years. 2 weeks ago he developed pain and tenderness and redness in the right medial thigh area which has persisted and is starting to slowly improve. He has no history of DVT or previous superficial thrombophlebitis. He does not take blood thinners. He does not wear elastic compression stockings. The bulging varicosities have been becoming increasingly painful over the past few years and he has developed bilateral edema during this time.Marland Kitchen. His symptoms are affecting his daily living.  Past Medical History  Diagnosis Date  . GERD (gastroesophageal reflux disease)   . BPH (benign prostatic hyperplasia)   . PTSD (post-traumatic stress disorder)   . Depression   . Prostatitis     History  Substance Use Topics  . Smoking status: Former Smoker    Types: Cigarettes    Quit date: 01/07/2009  . Smokeless tobacco: Not on file  . Alcohol Use: 0.6 - 1.2 oz/week    1-2 Standard drinks or equivalent per week    Family History  Problem Relation Age of Onset  . Heart disease Mother   . Hypertension Mother   . Cancer Sister     Allergies  Allergen Reactions  . Imitrex [Sumatriptan] Other (See Comments)    unknown  . Indomethacin Nausea Only and Other (See Comments)    "upset stomach"  . Relpax [Eletriptan] Swelling  . Dilaudid [Hydromorphone Hcl] Itching and Rash    Given benadryl with dosage to prevent reaction     Current outpatient prescriptions:  .  ALPRAZolam (XANAX) 1 MG tablet, Take 1 mg by mouth at bedtime as needed for anxiety., Disp: , Rfl:  .  buPROPion (WELLBUTRIN XL) 300 MG 24 hr tablet, Take 300 mg by mouth daily., Disp: , Rfl:  .  busPIRone (BUSPAR) 10 MG tablet, Take  10 mg by mouth at bedtime., Disp: , Rfl:  .  fish oil-omega-3 fatty acids 1000 MG capsule, Take 1 g by mouth 2 (two) times daily. , Disp: , Rfl:  .  HYDROmorphone (DILAUDID) 2 MG tablet, Take 2 mg by mouth every 4 (four) hours as needed for severe pain., Disp: , Rfl:  .  lurasidone (LATUDA) 40 MG TABS tablet, Take 40 mg by mouth daily with breakfast., Disp: , Rfl:  .  montelukast (SINGULAIR) 10 MG tablet, Take 10 mg by mouth daily., Disp: , Rfl: 5 .  Multiple Vitamin (MULTIVITAMIN WITH MINERALS) TABS tablet, Take 1 tablet by mouth daily., Disp: , Rfl:  .  oxybutynin (DITROPAN) 5 MG tablet, Take 5 mg by mouth at bedtime. , Disp: , Rfl:  .  pantoprazole (PROTONIX) 40 MG tablet, Take 40 mg by mouth daily., Disp: , Rfl:  .  ranitidine (ZANTAC) 150 MG tablet, Take 150 mg by mouth 2 (two) times daily., Disp: , Rfl:  .  tamsulosin (FLOMAX) 0.4 MG CAPS capsule, Take 0.4 mg by mouth daily., Disp: , Rfl:  .  venlafaxine XR (EFFEXOR-XR) 75 MG 24 hr capsule, Take 150 mg by mouth at bedtime. , Disp: , Rfl:  .  aspirin 325 MG EC tablet, Take 325 mg by mouth daily., Disp: , Rfl:  .  oxyCODONE-acetaminophen (PERCOCET/ROXICET) 5-325 MG per tablet, Take 1-3 tablets by  mouth every 4 (four) hours as needed for pain., Disp: , Rfl:   Filed Vitals:   05/03/14 1320  BP: 107/76  Pulse: 101  Height:  (1.88 m)  Weight: 267 lb (121.11 kg)  SpO2: 95%    Body mass index is 34.27 kg/(m^2).           Review of Systems denies chest pain, dyspnea on exertion, PND, orthopnea. Has history of major depression, PTSD, and bilateral knee replacements.     Objective:   Physical Exam BP 107/76 mmHg  Pulse 101  Ht  (1.88 m)  Wt 267 lb (121.11 kg)  BMI 34.27 kg/m2  SpO2 95%  Gen.-alert and oriented x3 in no apparent distress HEENT normal for age Lungs no rhonchi or wheezing Cardiovascular regular rhythm no murmurs carotid pulses 3+ palpable no bruits audible Abdomen soft nontender no palpable  masses Musculoskeletal free of  major deformities Skin clear -no rashes Neurologic normal Lower extremities 3+ femoral and dorsalis pedis pulses palpable bilaterally with 1+ edema bilaterally Superficial thrombophlebitis which is resolving in the mid medial thigh on the right with some thrombosed varicosities palpable as well as some thrombosed varicosities in the right posterior calf. Other bulging varicosities in the medial calf are patent. Left leg with bulging varicosities medial thigh into the medial calf area with no active ulcer.  Today I ordered bilateral venous duplex exam which I reviewed and interpreted. There is no DVT. The right leg has gross reflux in the right great saphenous vein and there is evidence of superficial thrombophlebitis with some thrombosed varicosities in the medial thigh and calf and posterior calf on the right. Left leg has gross reflux in this great saphenous vein supplying these painful varicosities in the thigh and calf area with no DVT.       Assessment:     Painful varicosities bilaterally with recent onset of superficial thrombophlebitis right thigh and calf due to gross reflux right great saphenous vein. Also documented his gross reflux of left great saphenous vein. Symptoms are affecting patient's daily living.    Plan:        #1 long leg elastic compression stockings 20-30 mm gradient #2 elevate legs as much as possible #3 ibuprofen daily on a regular basis for pain #4 return in 3 months-if no significant improvement then he will need #1 laser ablation right great saphenous vein followed by #2 laser ablation left great saphenous vein He would then return in 3 months to see if stab phlebectomy of residual painful varicosities would be indicated. Return to see me in 3 months

## 2014-05-26 ENCOUNTER — Encounter: Payer: Non-veteran care | Admitting: Vascular Surgery

## 2014-05-26 ENCOUNTER — Encounter (HOSPITAL_COMMUNITY): Payer: Non-veteran care

## 2014-08-02 ENCOUNTER — Ambulatory Visit: Payer: Medicare Other | Admitting: Vascular Surgery

## 2014-08-22 ENCOUNTER — Encounter: Payer: Self-pay | Admitting: Vascular Surgery

## 2014-08-23 ENCOUNTER — Ambulatory Visit: Payer: Medicare Other | Admitting: Vascular Surgery

## 2015-09-14 IMAGING — US US SCROTUM
1 series · 14 of 25 positions shown · non-contrast
Comparison: None

CLINICAL DATA: 47-year-old male with right testicular pain.

ULTRASOUND OF SCROTUM
TECHNIQUE: Complete ultrasound examination of the testicles,
epididymis, and other scrotal structures was performed.

[Series 1: us scrotum · 0.04mm/px · 14 of 47 slices shown]
[im 1/47]
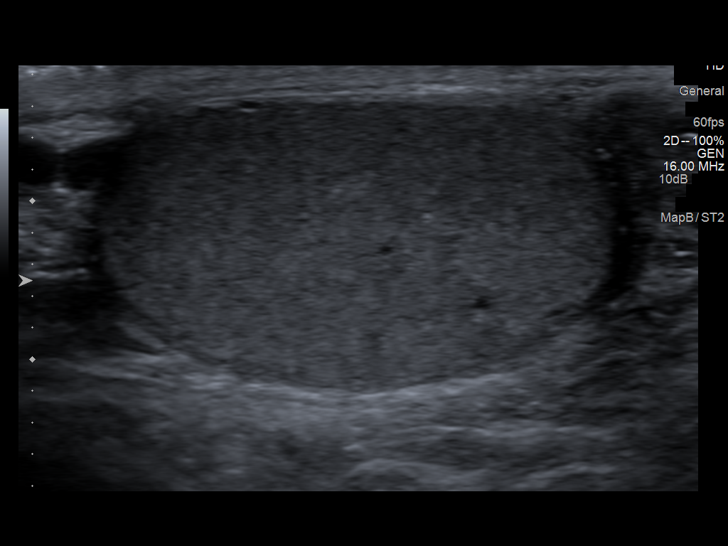
[im 4/47]
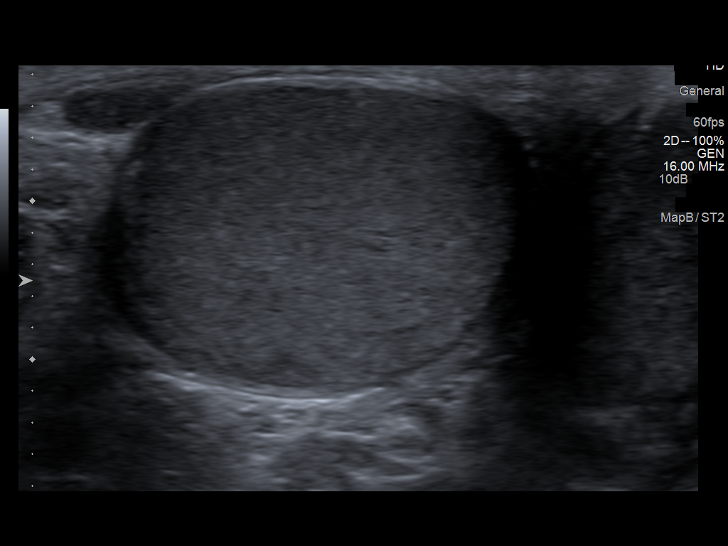
[im 8/47]
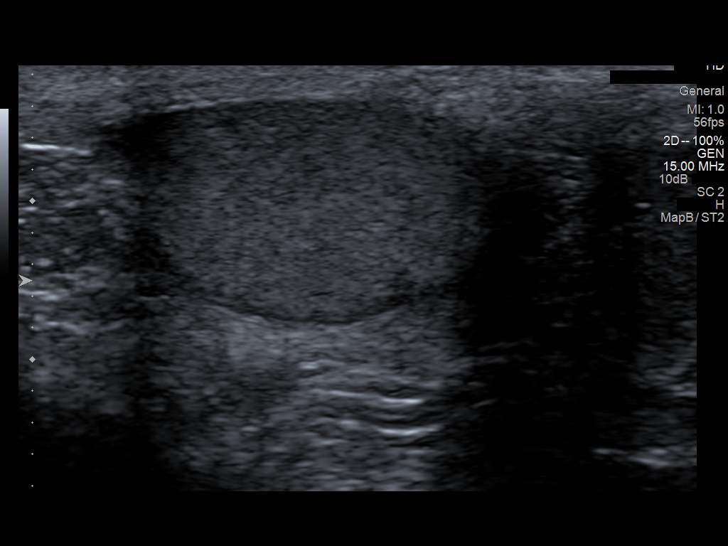
[im 12/47]
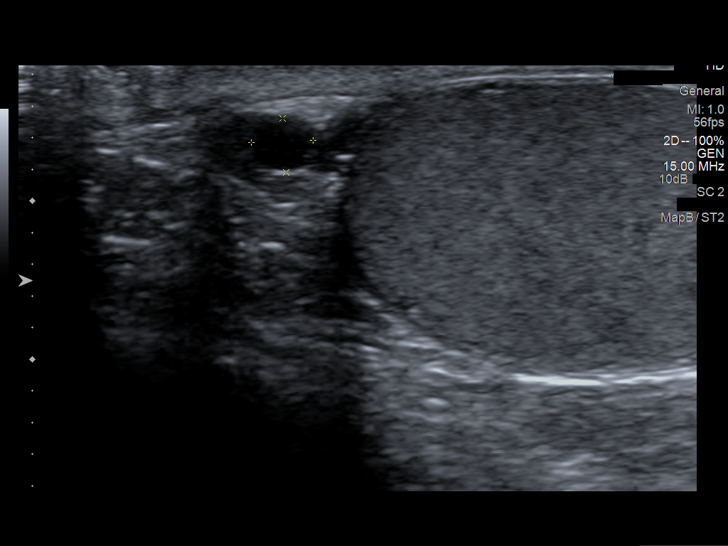
[im 16/47]
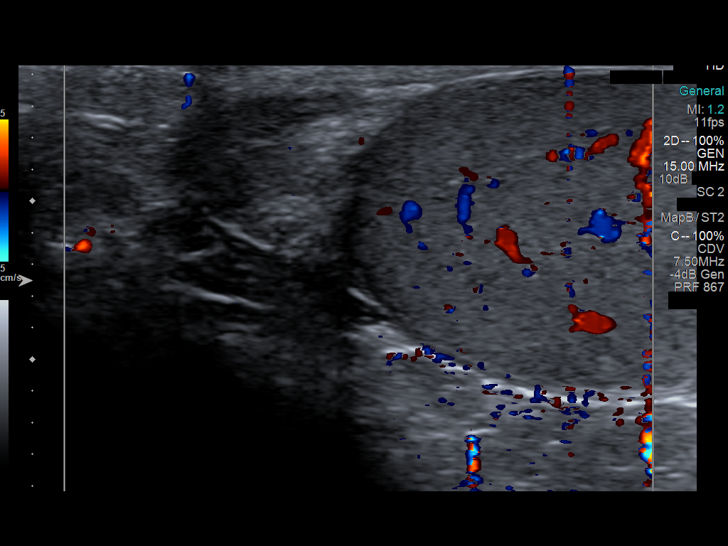
[im 18/47]
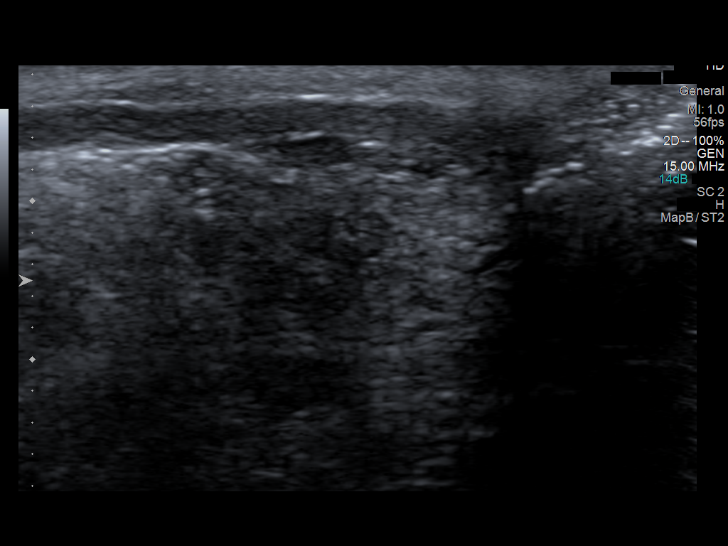
[im 22/47]
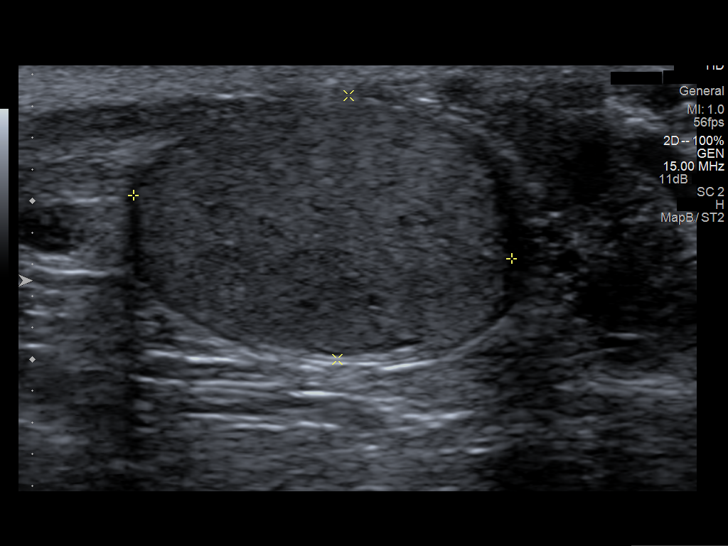
[im 25/47]
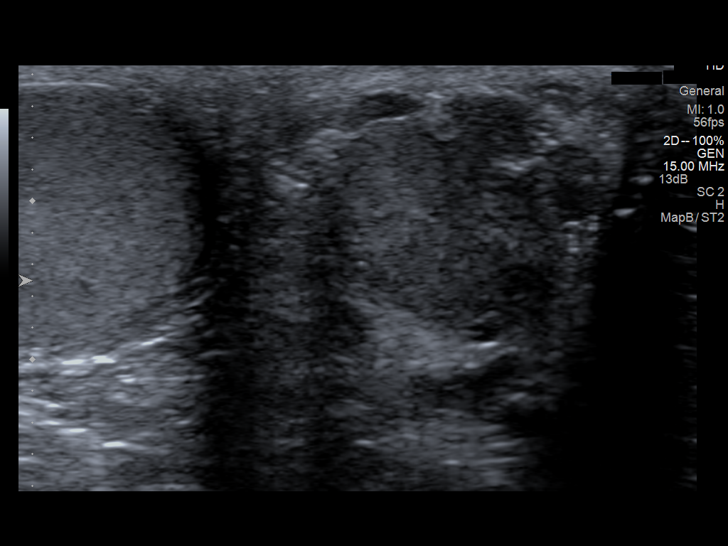
[im 29/47]
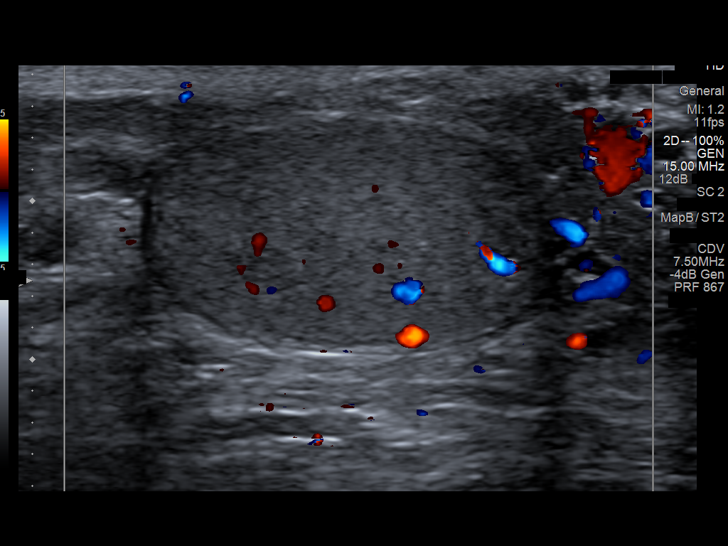
[im 31/47]
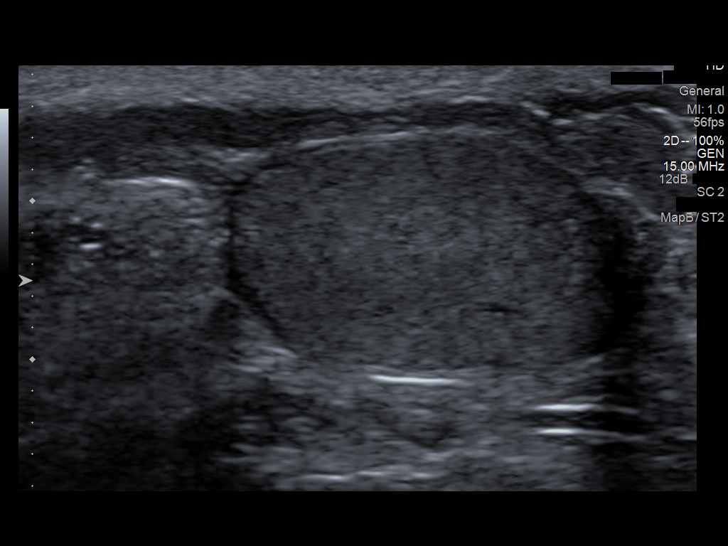
[im 35/47]
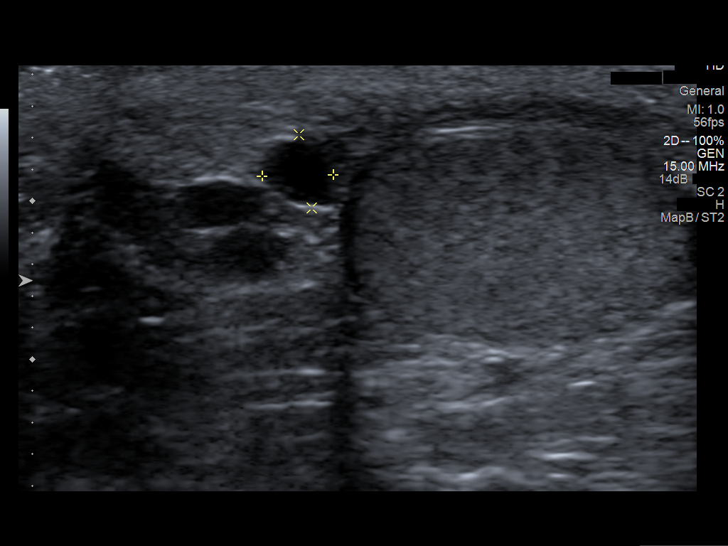
[im 39/47]
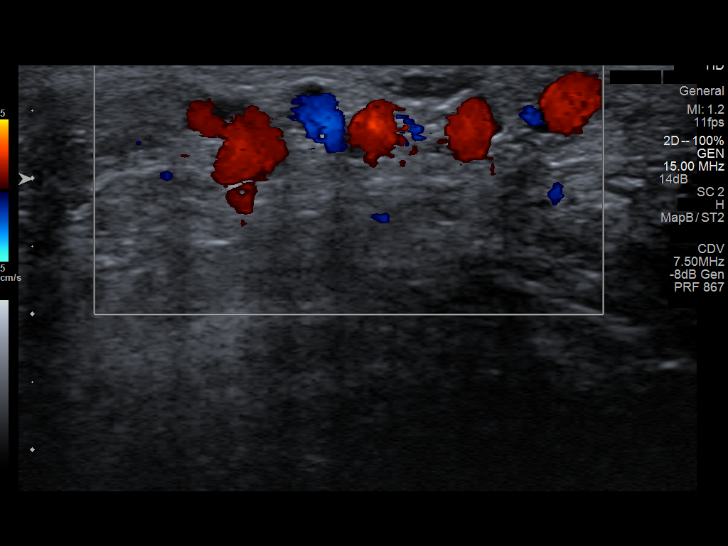
[im 43/47]
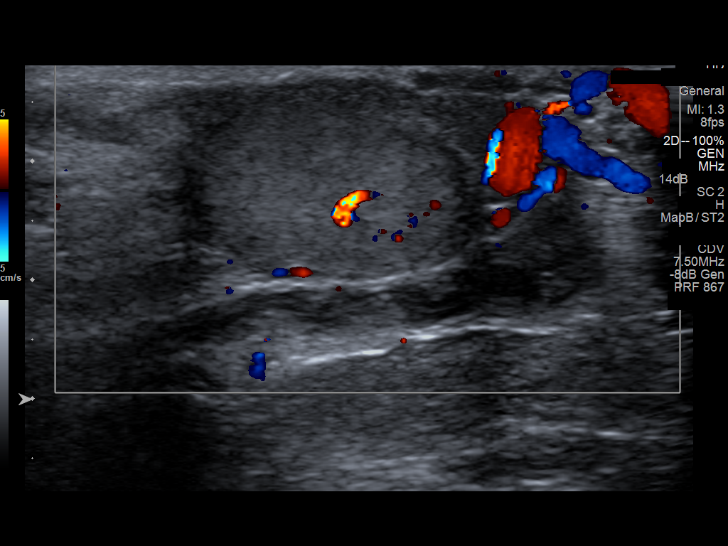
[im 47/47]
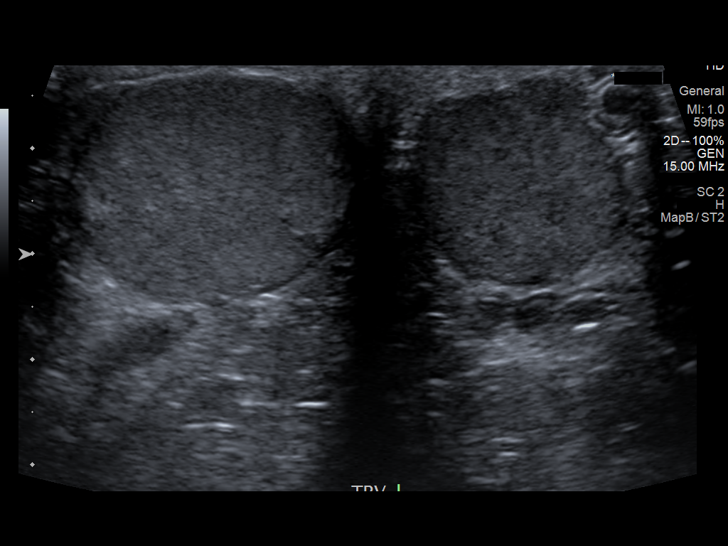

[14 of 25 positions shown; findings below may reference images not displayed]

FINDINGS: Right testis:  The right testicle is normal in echogenicity and
size measuring 3.3 x 1.9 x 2.5 cm.  No focal testicular
abnormalities are noted.  Color Doppler flow is identified.

Left testis:  The left testicle is normal in echogenicity and size
measuring 2.4 x 1.7 x 2 cm.  Color Doppler flow is identified.  No
focal testicular abnormalities are noted.

Right epididymis:  A 5 mm epididymal cyst is noted.

Left epididymis:  Small epididymal cysts are present.

Hydrocele:  Absent

Varicocele:  Mild left varicocele present.
IMPRESSION: No acute abnormalities.  Normal testicles bilaterally.

Left varicocele.

## 2017-04-08 IMAGING — US US EXTREM LOW VENOUS*L*
1 series · 14 of 24 positions shown · non-contrast
Comparison: None.

CLINICAL DATA: Right leg pain.

EXAM:
RIGHT LOWER EXTREMITY VENOUS DUPLEX ULTRASOUND
TECHNIQUE: Doppler venous assessment of the right lower extremity deep venous
system was performed, including characterization of spectral flow,
compressibility, and phasicity.

[Series 1: us extrem low venous*left* · 14 of 34 slices shown]
[im 1/34]
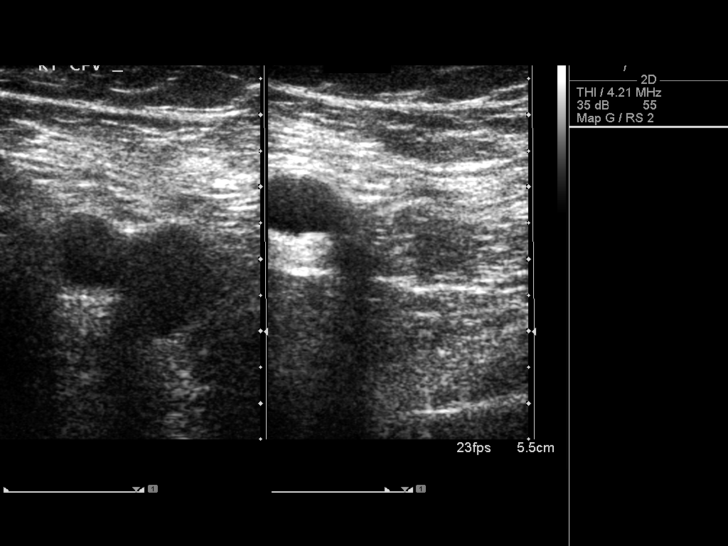
[im 3/34]
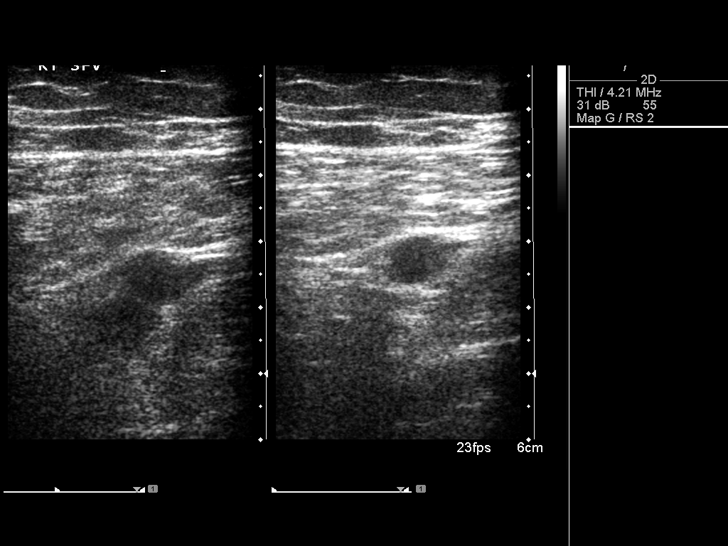
[im 6/34]
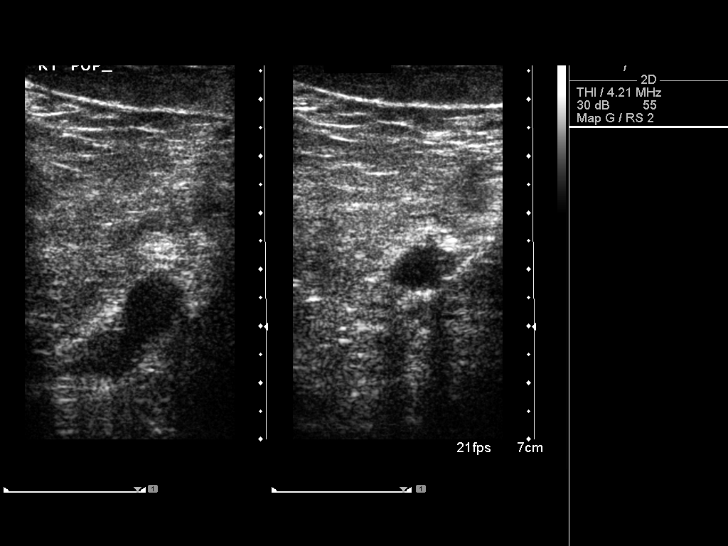
[im 9/34]
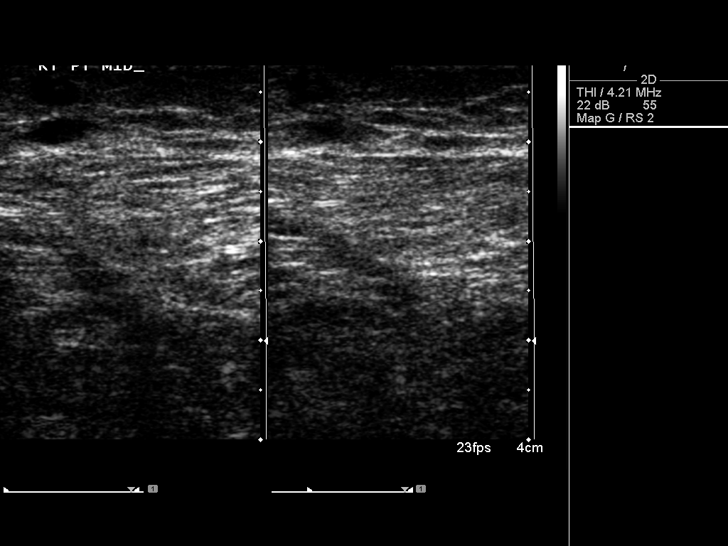
[im 11/34]
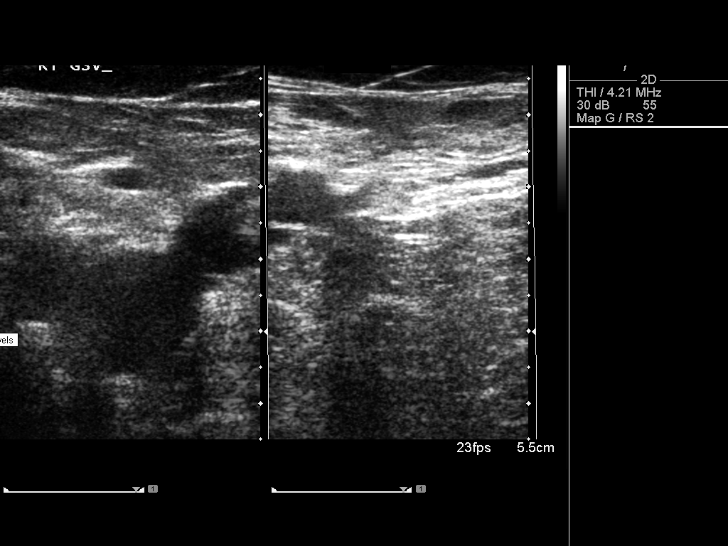
[im 13/34]
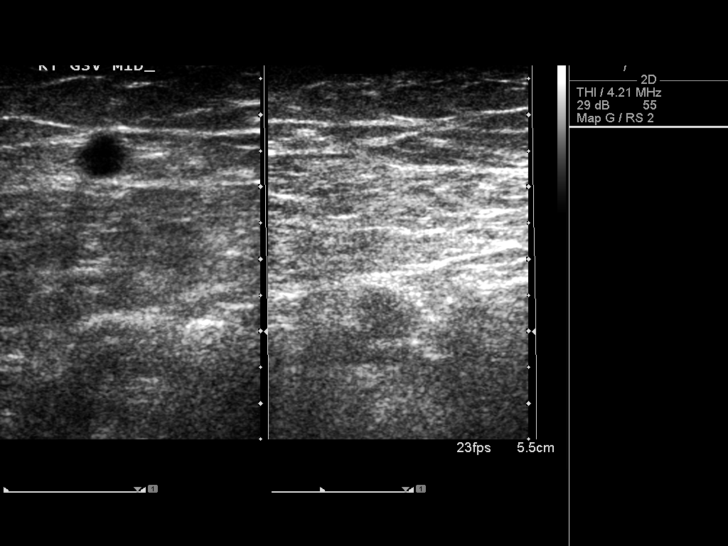
[im 16/34]
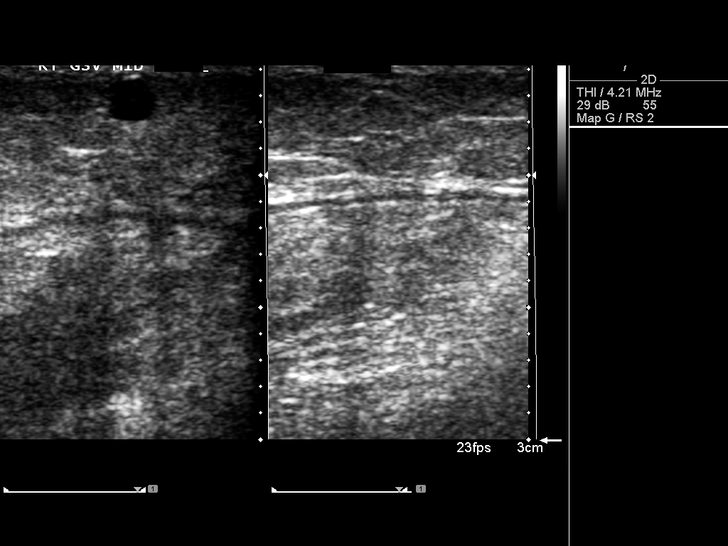
[im 18/34]
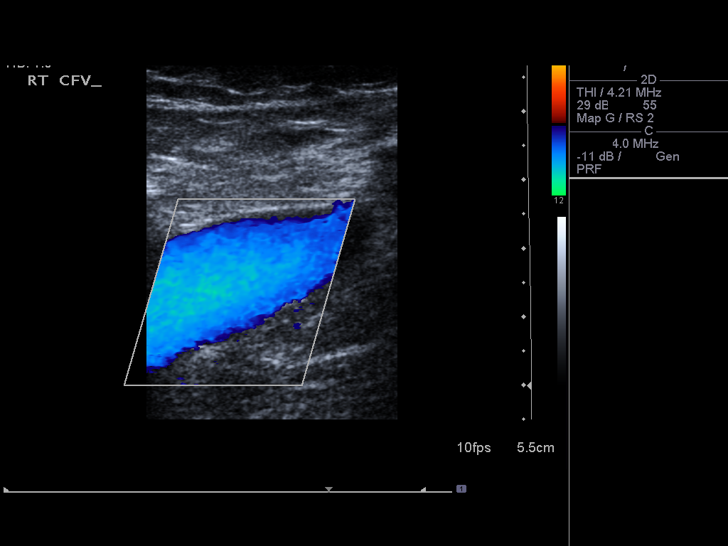
[im 21/34]
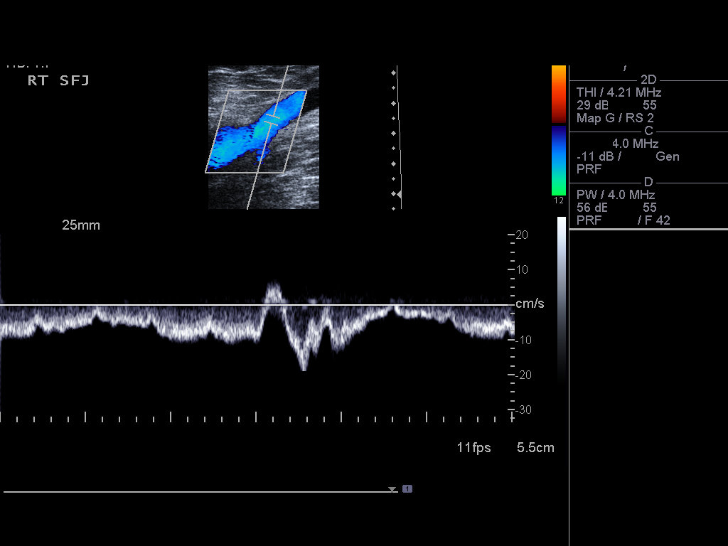
[im 23/34]
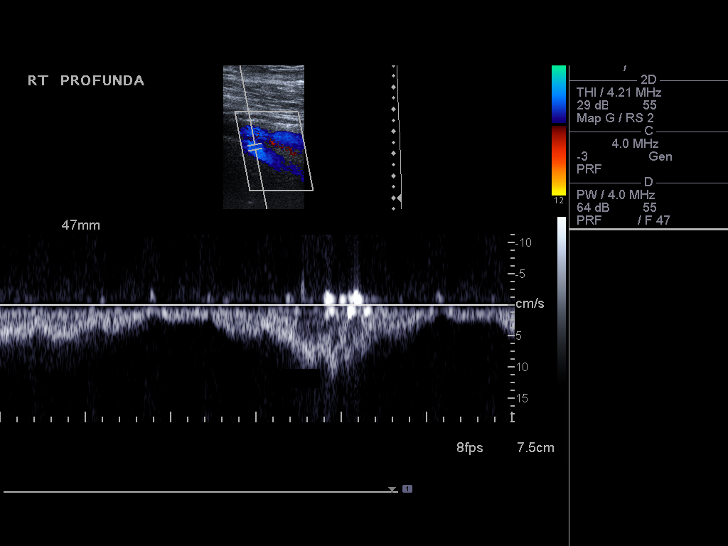
[im 26/34]
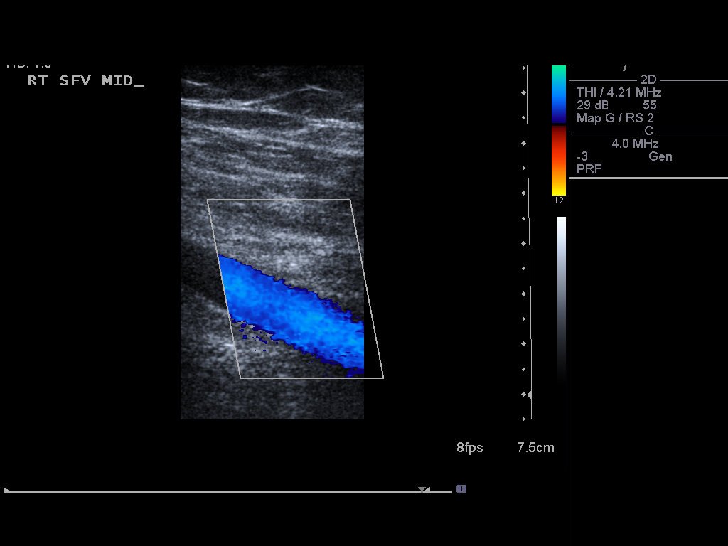
[im 28/34]
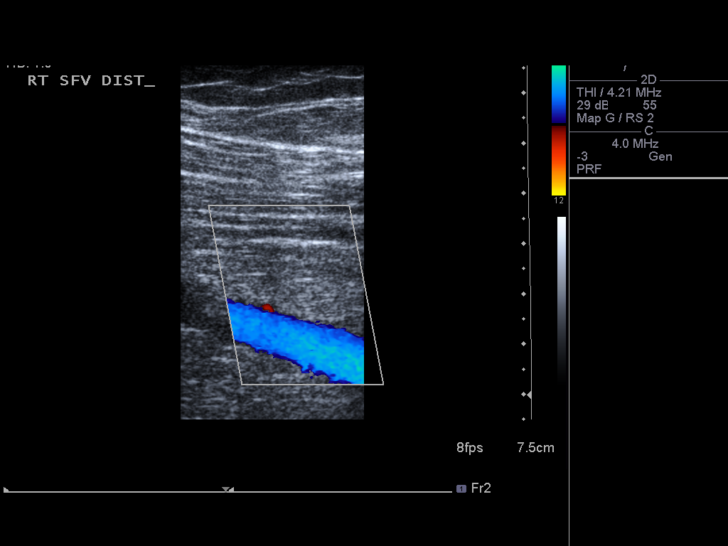
[im 31/34]
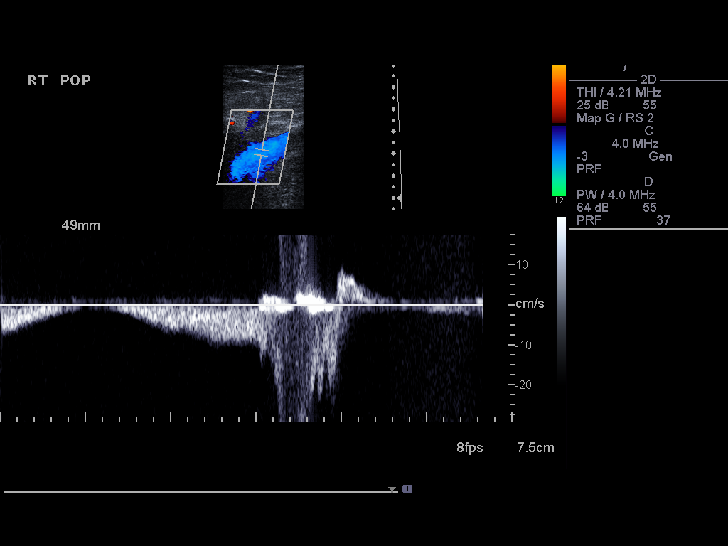
[im 34/34]
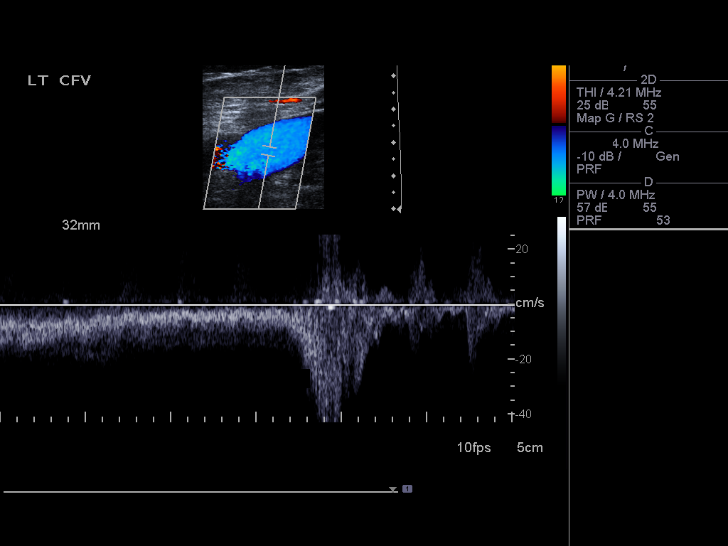

[14 of 24 positions shown; findings below may reference images not displayed]

FINDINGS: There is complete compressibility of the right common femoral,
femoral, and popliteal veins. Doppler analysis demonstrates
respiratory phasicity and augmentation of flow.

No evidence of DVT in the calf.

There is thrombosis of the right greater saphenous vein in the
region of the knee.
IMPRESSION: No evidence of DVT.

Superficial vein thrombosis within the greater saphenous vein at the
level of the knee.

## 2018-09-30 NOTE — Care Plan (Signed)
Formatting of this note might be different from the original.    Problem: Bowel Function - Altered  Goal: Bowel function within specified parameters  Outcome: Completed    Problem: Pain  Goal: Effective pain management  Outcome: Completed    Electronically signed by Vinnie Level, RN at 09/30/2018 11:13 AM EDT

## 2018-09-30 NOTE — H&P (Signed)
Formatting of this note is different from the original.  [facility] Consult Note  [address] [place] [address]  [phone]    Dr. Elige Radon  Dr. Glory Buff  Dr. Peyton Bottoms Dhirmalani  Dr. Enriqueta Shutter  Dr. Evert Kohl  Dr. Remigio Eisenmenger, NP    Patient Name: Brandon Montoya.  MRN: [id number]    Assessment:  1. GERD  2. H/O colon polyps    Plan:  -EGD/Colonoscopy    Chief Complaint: GERD, H/o polyps    History of Present Illness:  The patient is a 53 y.o. male presenting for EGD and colonoscopy.    Medications:    Current Facility-Administered Medications:   ?  lactated ringers (LR) infusion, 20 mL/hr, Intravenous, Continuous, De Burrs, MD    Allergies:  Allergies   Allergen Reactions   ? Indomethacin GI Intolerance   ? Relpax [Eletriptan] Swelling     Social History: The patient lives at home with family, nonsmoker, no heavy alcohol use.    Family History: There are no known GI malignancies in any first degree relatives, including no gastric, esophageal, colon, liver, or pancreatic cancers.    Review of Systems: A complete review of systems was done, aside from the pertinent positives in the HPI, the remainder of the ROS was unremarkable.    Physical Exam:  General: Patient appears well, no apparent distress.    Vitals:    09/30/18 1057   BP: 115/70   Pulse: 98   Resp: 18   Temp: 36.4 C (97.5 F)   SpO2: 98%     Skin: No rashes or ulcerations noted, otherwise unremarkable.  Eyes: Moist conjunctivae, no conjunctival icterus, otherwise unremarkable.  Ears, Nose, Throat: Non-tender neck, no palpable masses, otherwise unremarkable.  Cardiovascular: S1S2 heard, regular rate, no murmurs, otherwise unremarkable.  Respiratory: Lungs sounds clear to auscultation, no crackles or wheezing heard, otherwise unremarkable.  Gastrointestinal: Abdomen is soft, non-tender, no palpable masses.  Normal bowel sounds, otherwise unremarkable.  Musculoskeletal:  Non-tender limbs, no contracted limbs, otherwise unremarkable.  Neurologic: Patient is awake and alert, oriented to self, otherwise unremarkable.  Psychiatric: Patient appears calm, not agitated, normal mood, otherwise unremarkable.  Extremities: No lower extremity edema, no cyanosis, otherwise unremarkable.  Lymphatic: No palpable supraclavicular or periumbilical nodes, otherwise unremarkable.    Labs:  Lab Results   Component Value Date    WBC 5.93 07/09/2017    WBCURINE 0-5 07/08/2017    HGB 12.5 (L) 07/09/2017    HCT 35.6 (L) 07/09/2017    MCV 99.2 07/09/2017    PLT 166 07/09/2017    RDW 12.1 07/09/2017    PT 14.6 07/07/2017    INR 1.11 07/07/2017     The patient's medications were reviewed. and The patient's medical record was reviewed.    Electronically signed by De Burrs, MD at 09/30/2018 11:35 AM EDT

## 2018-09-30 NOTE — OR Nursing (Signed)
Formatting of this note might be different from the original.   Discharge instructions done with teach back.     Electronically signed by Myrtie Neither, RN at 09/30/2018  4:08 PM EDT

## 2020-12-27 NOTE — Progress Notes (Signed)
Formatting of this note is different from the original.  Patient Name:  Brandon Montoya.  Sex:  male  Birthday:  04-Jul-1965 Visit Type/Date: NEW PATIENT  12/27/2020  Provider:  Barb Merino, MD  Location:  [facility]     Chief Complaint   Patient presents with   ? New Patient     Abnormal proteins     HPI  Patient is a 55 year old gentleman.  Had evaluation for monoclonal proteins performed was noted to have abnormal immunofixation elevation in his kappa light chain.  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  Quantitative immunoglobulin showed a increase in IgA.  Patient denies any bone  Calcium was normal  Kidney function was normal  CBC was normal  Past Medical History:   Diagnosis Date   ? Anxiety    ? Arthritis    ? Bipolar 1 disorder (HCC)    ? Chronic pain disorder     b/l knees, b/l hips   ? Colon polyp    ? Depression    ? Enlarged prostate    ? Frequent diarrhea    ? GERD (gastroesophageal reflux disease)    ? Joint pain    ? Nausea & vomiting    ? Obesity    ? Osteoarthritis     KNEES   ? PTSD (post-traumatic stress disorder)    ? Sleep apnea     USES CPAP   ? Tachycardia    ? Transfusion history     NO REACTION     family history includes Heart disease in his father; Sarcoidosis in his mother.    Social History     Socioeconomic History   ? Marital status: Divorced     Spouse name: Not on file   ? Number of children: Not on file   ? Years of education: Not on file   ? Highest education level: Not on file   Occupational History   ? Occupation: Corporate treasurer   ? Occupation: Glass blower/designer   Tobacco Use   ? Smoking status: Former     Packs/day: 0.50     Types: Cigarettes     Start date: 07/10/1984     Quit date: 2011     Years since quitting: 11.9   ? Smokeless tobacco: Never   Vaping Use   ? Vaping Use: Every day   ? Substances:  THC, Mixture of cannabinoids   ? Devices: Pre-filled or refillable cartridge, Refillable tank   Substance and Sexual Activity   ? Alcohol use: Yes     Comment: ONCE A MONTH- WINE   ? Drug use: Yes     Types: Marijuana   ? Sexual activity: Yes     Partners: Female   Other Topics Concern   ? Not on file   Social History Narrative   ? Not on file     Social Determinants of Health     Financial Resource Strain: Not on file   Food Insecurity: Not on file   Transportation Needs: Not on file   Physical Activity: Not on file   Stress: Not on file   Social Connections: Not on file   Intimate Partner Violence: Not on file   Housing Stability: Not on file     Allergies   Allergen Reactions   ? Indomethacin GI Intolerance  The following portions of the patient's history were reviewed and updated as appropriate: allergies, current medications, past family history, past medical history, past social history and past surgical history.    The following items below were personally reviewed by me:  None    Review of Systems  No chest pain or shortness of    Vitals:    12/27/20 1416   BP: 120/75   Pulse: 76   Temp: 36.6 C (97.9 F)   SpO2: 95%     Physical Exam  Eyes anicteric  No lymphadenopathy    No orders of the defined types were placed in this encounter.    No follow-ups on file.    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  Reviewed polyclonal gammopathies are likely secondary to inflammatory or infectious disorders.  The evaluation for monoclonal proteins was complete so no further blood work is needed at this time  Would simply repeat blood work at 6 months time.    Barb Merino, MD    Electronically signed by Barb Merino, MD at 12/28/2020  2:03 PM EST

## 2021-11-21 ENCOUNTER — Ambulatory Visit
Admit: 2021-11-21 | Discharge: 2021-11-21 | Payer: PRIVATE HEALTH INSURANCE | Attending: Orthopaedic Surgery | Primary: Diagnostic Radiology

## 2021-11-21 ENCOUNTER — Encounter

## 2021-11-21 DIAGNOSIS — M25521 Pain in right elbow: Secondary | ICD-10-CM

## 2021-11-21 NOTE — Progress Notes (Signed)
Brandon Montoya  1965-08-17   Chief Complaint   Patient presents with    Elbow Injury     Rt          HISTORY OF PRESENT ILLNESS  Brandon Montoya is a 56 y.o. male who presents today for evaluation of right elbow pain.  Pain is a 9/10. Pain has been present since July. Was diagnosed with tennis elbow while living in [place]. Had an injection in August with no benefit. Denies injury. Pt notes pain while straightening elbow and picking up objects. Pt notes that he used to fall and pass out a a lot. He notes a history of back surgery January this year.     Has tried following treatments: Injections:Yes; Brace:No; Therapy:No; Cane/Crutch:No      No Known Allergies     No past medical history on file.   Social History    None        No past surgical history on file.   No family history on file.  No current outpatient medications on file.     No current facility-administered medications for this visit.       REVIEW OF SYSTEM   Patient denies: Weight loss, Fever/Chills, HA, Visual changes, Fatigue, Chest pain, SOB, Abdominal pain, N/V/D/C, Blood in stool or urine, Edema.   Pertinent positive as above in HPI. All others were negative    PHYSICAL EXAM:   Ht 1.829 m (6')   Wt 108.9 kg (240 lb)   BMI 32.55 kg/m   The patient is a well-developed, well-nourished male   in no acute distress.  The patient is alert and oriented times three.  The patient is alert and oriented times three. Mood and affect are normal.  LYMPHATIC: lymph nodes are not enlarged and are within normal limits  SKIN: normal in color and non tender to palpation. There are no bruises or abrasions noted.   NEUROLOGICAL: Motor sensory exam is within normal limits. Reflexes are equal bilaterally. There is normal sensation to pinprick and light touch  MUSCULOSKELETAL:    Examination Left Elbow Right Elbow   Skin Intact Intact   Range of Motion 0-135 20-110   Tenderness Medial Epicondyle - -   Tenderness Lateral Epicondyle - +   Tenderness Olecranon Bursa  - -   Swelling - -   Bruising - -   Stability Normal Normal   Motor Strength  Normal Normal   Neurovascular Intact Intact            IMAGING: XR of the right elbow with 3 views obtained in office dated 11/21/2021 reviewed and read by Dr. Domingo Pulse: No acute abnormalities    IMPRESSION:      ICD-10-CM    1. Right elbow pain  M25.521 AMB POC XRAY, ELBOW; COMPLETE, 3+ VIEW      2. Right tennis elbow  M77.11       3. Partial tear of common extensor tendon of right elbow  S56.511A            PLAN:   1. Pt presented with right elbow pain. I suspect that this pain may be caused by a partial tear of his common extensor tendon. I will order an MRI to rule out.   Risk factors include: BMI>30  2. No cortisone injection indicated today  3. No Physical/Occupational Therapy indicated today  4. Yes diagnostic test indicated today MRI  5. No durable medical equipment indicated today  6. No referral indicated today   7.  No medications indicated today:   8. No Narcotic indicated today.     RTC after MRI       Scribed by Roderic Palau [facility]) as dictated by Bettina Gavia, MD    I, Dr. Bettina Gavia, confirm that all documentation is accurate.    Bettina Gavia, M.D.   [facility]

## 2021-11-22 NOTE — Telephone Encounter (Signed)
If he doesn't have a tennis elbow brace please provide him with one

## 2021-11-22 NOTE — Telephone Encounter (Signed)
From: Brandon Montoya  To: Dr. Richardo Hanks  Sent: 11/21/2021 4:08 PM EST  Subject: Brace for pain relief     Hello, this is Brandon Montoya. I want to know if I need any kind of brace for my right elbow.

## 2021-11-26 NOTE — Telephone Encounter (Signed)
Please order MRI of elbow no contrast r/o common extensor tendon tear. Have him see [facility] following MRI

## 2021-11-26 NOTE — Telephone Encounter (Signed)
Since he has MRI have him make an apt following MRI with [facility]. Sometime next week. Ice or heat to elbow

## 2021-11-28 ENCOUNTER — Inpatient Hospital Stay: Admit: 2021-11-28 | Payer: PRIVATE HEALTH INSURANCE | Attending: Orthopaedic Surgery | Primary: Diagnostic Radiology

## 2021-11-28 DIAGNOSIS — S56511A Strain of other extensor muscle, fascia and tendon at forearm level, right arm, initial encounter: Secondary | ICD-10-CM

## 2021-12-04 ENCOUNTER — Ambulatory Visit
Admit: 2021-12-04 | Discharge: 2021-12-04 | Payer: PRIVATE HEALTH INSURANCE | Attending: Orthopaedic Surgery | Primary: Diagnostic Radiology

## 2021-12-04 DIAGNOSIS — S56511A Strain of other extensor muscle, fascia and tendon at forearm level, right arm, initial encounter: Secondary | ICD-10-CM

## 2021-12-04 NOTE — Progress Notes (Signed)
Brandon Montoya  1965/10/21   Chief Complaint   Patient presents with    Diagnostic Test Follow Up     MRI review        HISTORY OF PRESENT ILLNESS  Brandon Montoya is a 56 y.o. male who presents today for reevaluation of right elbow pain. Pain is a 9/10. Pain has been present since July. Was diagnosed with tennis elbow while living in [place]. Had an injection in August with no benefit. Denies injury. Pt notes pain while straightening elbow and picking up objects. Pt notes that he used to fall and pass out a a lot. He notes a history of back surgery January this year.    Pt notes painful popping when moving his right elbow. Pt endorses night pain. He wishes to undergo surgery.     Patient denies any fever, chills, chest pain, shortness of breath or calf pain. The remainder of the review of systems is negative. There are no new illness or injuries to report since last seen in the office. No changes in medications, allergies, social or family history.      PHYSICAL EXAM:   Temp (!) 96 F (35.6 C) (Temporal)   Ht 1.829 m (6')   Wt 106.6 kg (235 lb)   BMI 31.87 kg/m   The patient is a well-developed, well-nourished male   in no acute distress.  The patient is alert and oriented times three.  The patient is alert and oriented times three. Mood and affect are normal.  LYMPHATIC: lymph nodes are not enlarged and are within normal limits  SKIN: normal in color and non tender to palpation. There are no bruises or abrasions noted.   NEUROLOGICAL: Motor sensory exam is within normal limits. Reflexes are equal bilaterally. There is normal sensation to pinprick and light touch  MUSCULOSKELETAL:  Examination Left Elbow Right Elbow   Skin Intact Intact   Range of Motion 0-135 20-110   Tenderness Medial Epicondyle - -   Tenderness Lateral Epicondyle - +   Tenderness Olecranon Bursa - -   Swelling - -   Bruising - -   Stability Normal Normal   Motor Strength  Normal Normal   Neurovascular Intact Intact        PROCEDURE:      IMAGING: MRI of the right elbow [facility] dated 11/28/2021 reviewed and read by Dr. Domingo Pulse: Common extensor tendinopathy with low-grade interstitial tearing. Underlying  radial collateral ligament appears slightly thickened but largely intact.     Short segment of ulnar nerve thickening and edema like signal and cubital  tunnel. Correlate clinically for ulnar neuropathy.     Mild elbow degenerative changes.    XR of the right elbow with 3 views obtained in office dated 11/21/2021 reviewed and read by Dr. Domingo Pulse: No acute abnormalities    IMPRESSION:      ICD-10-CM    1. Partial tear of common extensor tendon of right elbow  S56.511A       2. Right elbow pain  M25.521       3. Right tennis elbow  M77.11            PLAN:   1. Pt presented today with right elbow pain secondary to lateral epicondylitis and partial common extensor tendon tear. I discussed the risks and benefits and potential adverse outcomes of both operative vs non operative treatment of common extensor tendon tear with the patient and patient wishes to proceed with common extensor tendon repair with  [facility].      Risks of operative intervention include but not limited to bleeding, infection, deep vein thrombosis, pulmonary embolism, death, limb length discrepancy, reflexive sympathetic dystrophy, fat embolism syndrome,damage to blood vessels and nerves, malunion, non-union, delayed union, failure of hardware, post traumatic arthritis, stroke, heart attack, and death.      Patient understands that infection may arise and may require numerous surgeries. The patient was counseled at length about the risks of contracting Covid-19 during their perioperative period and any recovery window from their procedure.  The patient was made aware that contracting Covid-19  may worsen their prognosis for recovering from their procedure and lend to a higher morbidity and/or mortality risk.  All material risks, benefits, and reasonable alternatives including  postponing the procedure were discussed. The patient does  wish to proceed with the procedure at this time.       History and physical exam to be preformed at a later date.  Risk factors include: BMI>30  2. No cortisone injection indicated today   3. No Physical/Occupational Therapy indicated today  4. No diagnostic test indicated today:   5. No durable medical equipment indicated today  6. No referral indicated today   7. No medications indicated today:   8. No Narcotic indicated today.       RTC for H&P       Scribed by Roderic Palau [facility]) as dictated by Bettina Gavia, MD    I, Dr. Bettina Gavia, confirm that all documentation is accurate.    Bettina Gavia, M.D.   [facility]

## 2021-12-17 ENCOUNTER — Ambulatory Visit
Admit: 2021-12-17 | Discharge: 2021-12-17 | Payer: PRIVATE HEALTH INSURANCE | Attending: Family Medicine | Primary: Family Medicine

## 2021-12-17 DIAGNOSIS — Z01818 Encounter for other preprocedural examination: Secondary | ICD-10-CM

## 2021-12-17 MED ORDER — PANTOPRAZOLE SODIUM 40 MG PO TBEC
40 MG | ORAL_TABLET | Freq: Every day | ORAL | 1 refills | Status: DC
Start: 2021-12-17 — End: 2022-02-20

## 2021-12-17 NOTE — Progress Notes (Unsigned)
EKG was ordered, performed and reviewed without issue incident or complaint    Patient received Flucelex immunization IM to left deltoid.  Hetolerated procedure well. Patient was observed for 10 minutes, no adverse effects noted. He left ambulatory with no complaints of pain or distress noted.  Patient hasreceived immunizations in office prior to today.    1. "Have you been to the ER, urgent care clinic since your last visit?  Hospitalized since your last visit?"No    2. "Have you seen or consulted any other health care providers outside of the [facility] since your last visit?" No    3. For patients aged 46-75: Has the patient had a colonoscopy / FIT/ Cologuard? No      If the patient is male:    4. For patients aged 46-74: Has the patient had a mammogram within the past 2 years? Not applicable      5. For patients aged 21-65: Has the patient had a pap smear? Not applicable

## 2021-12-17 NOTE — Progress Notes (Unsigned)
HPI  Brandon Montoya comes in to establish care and for preop evaluation..  Preop evaluation:  Referring physician: Dr. Bettina Gavia  Date of procedure: 12/28/2021  Planned procedure: Right elbow common extensor tendon repair  Indication for procedure: Partial tear right common extensor tendon    Right elbow/forearm pain: Patient has history of tear of right common extensor tendon.  Started having pain and discomfort right forearm.  Denies any direct trauma.  He was seen by an orthopedist in [place] and had steroid injection given.  Pain persisted with some weakness.  He has been evaluated by the orthopedist.  Had an MRI done that showed partial tear right common extensor tendon.  He now comes in for preop evaluation and awaits surgery as noted above.  He had an EKG done today that is normal.  Await lab results.  If CBC and BMP are stable patient will be cleared to proceed with planned procedure.      PTSD: on seroquel, clonazepam  Depression and anxiety: on prozac, abilify, alprazolam  Insomnia:   Spinal fusion:  LUTS: Has poor urinary stream, urinary frequency, on vesicare  ED: On tadalafil  Tachycardia: on metoprolol  GERD: on protonix  Allergy: singulair    Bilateral TKA with revisions done  Cholecystectomy:  Gynecomastia with right mastectomy      Past Medical History  Past Medical History:   Diagnosis Date    PTSD (post-traumatic stress disorder)        Surgical History  No past surgical history on file.     Medications  Current Outpatient Medications   Medication Sig Dispense Refill    acetaminophen (TYLENOL) 500 MG tablet Take 2 tablets by mouth every 8 hours as needed      ALPRAZolam (XANAX) 1 MG tablet Take 1 tablet by mouth Every Day.      clonazePAM (KLONOPIN) 1 MG tablet Take 2 tablets by mouth 2 times daily.      metoprolol succinate (TOPROL XL) 25 MG extended release tablet Take 1 tablet by mouth as needed      pantoprazole (PROTONIX) 40 MG tablet 1 tablet      FLUoxetine (PROZAC) 10 MG  capsule Take 8 capsules by mouth daily      sildenafil (REVATIO) 20 MG tablet Take 1 tablet by mouth as needed      solifenacin (VESICARE) 5 MG tablet Take 2 tablets by mouth daily      dicyclomine (BENTYL) 20 MG tablet Take 1 tablet by mouth every 6 hours      ARIPiprazole (ABILIFY) 2 MG tablet Take 1 tablet by mouth daily      montelukast (SINGULAIR) 10 MG tablet Take 1 tablet by mouth nightly       No current facility-administered medications for this visit.       Allergies  Allergies   Allergen Reactions    Eletriptan Anaphylaxis, Shortness Of Breath and Swelling    Indomethacin Other (See Comments) and Nausea And Vomiting       Family History  No family history on file.    Social History  Social History     Socioeconomic History    Marital status: Married     Spouse name: Not on file    Number of children: Not on file    Years of education: Not on file    Highest education level: Not on file   Occupational History    Not on file   Tobacco Use    Smoking status:  Never    Smokeless tobacco: Never   Substance and Sexual Activity    Alcohol use: Not on file    Drug use: Not on file    Sexual activity: Not on file   Other Topics Concern    Not on file   Social History Narrative    Not on file     Social Determinants of Health     Financial Resource Strain: Low Risk  (12/17/2021)    Overall Financial Resource Strain (CARDIA)     Difficulty of Paying Living Expenses: Not very hard   Food Insecurity: Unknown (12/17/2021)    Hunger Vital Sign     Worried About Running Out of Food in the Last Year: Never true     Ran Out of Food in the Last Year: Not on file   Transportation Needs: Unknown (12/17/2021)    PRAPARE - Therapist, art (Medical): Not on file     Lack of Transportation (Non-Medical): No   Physical Activity: Inactive (12/14/2021)    Exercise Vital Sign     Days of Exercise per Week: 0 days     Minutes of Exercise per Session: 0 min   Stress: Not on file   Social Connections: Not on file    Intimate Partner Violence: Not on file   Housing Stability: Unknown (12/17/2021)    Housing Stability Vital Sign     Unable to Pay for Housing in the Last Year: Not on file     Number of Places Lived in the Last Year: Not on file     Unstable Housing in the Last Year: No       Review of Systems  Review of Systems - {ros master:310782}    Vital Signs  BP 104/70   Pulse 92   Temp 98.8 F (37.1 C)   Resp 20   Ht 1.829 m (6')   Wt 107.5 kg (237 lb)   SpO2 98%   BMI 32.14 kg/m       Physical Exam  Physical Examination: {male adult master:310786}  Physical Examination: {male adult master:310785}          Results  No results found for this visit on 12/17/21.    ASSESSMENT and PLAN  No diagnosis found.   {Assessment and Plan Chronic Disease:[phone]}      I have discussed the diagnosis with the patient and the intended plan of care as seen in the above orders. The patient has received an after-visit summary and questions were answered concerning future plans. I have discussed medication, side effects, and warnings with the patient in detail. The patient verbalized understanding and is in agreement with the plan of care. The patient will contact the office with any additional concerns.    {time statement optional (Optional):29318}    Graysen Depaula N Mamta Rimmer, MD    PLEASE NOTE:   This document has been produced using voice recognition software. Unrecognized errors in transcription may be present

## 2021-12-18 NOTE — Telephone Encounter (Signed)
Pt stated he had an appt with Dr. Elta Guadeloupe yesterday, and he wants him to know that his surgery is 01/03/22 with Dr. Domingo Pulse. Please advise. Thank you!!!

## 2021-12-19 LAB — COMPREHENSIVE METABOLIC PANEL
ALT: 7 U/L (ref 5–40)
AST: 17 U/L (ref 10–37)
Albumin/Globulin Ratio: 1.3 ratio (ref 1.1–2.6)
Albumin: 4.4 g/dL (ref 3.5–5.0)
Alkaline Phosphatase: 112 U/L (ref 25–115)
Anion Gap: 10 mmol/L (ref 3.0–15.0)
BUN: 9 mg/dL (ref 6–22)
CO2: 28 mmol/L (ref 20–32)
Calcium: 9.9 mg/dL (ref 8.4–10.5)
Chloride: 98 mmol/L (ref 98–110)
Creatinine: 0.8 mg/dL (ref 0.5–1.2)
Globulin: 3.4 g/dL (ref 2.0–4.0)
Glomerular Filtration Rate: >60 mL/min/{1.73_m2} (ref >60.0)
Glucose: 94 mg/dL (ref 70–99)
Potassium: 4 mmol/L (ref 3.5–5.5)
Sodium: 136 mmol/L (ref 133–145)
Total Bilirubin: 0.5 mg/dL (ref 0.2–1.2)
Total Protein: 7.8 g/dL (ref 6.4–8.3)

## 2021-12-19 LAB — CBC
Hematocrit: 46 % (ref 39.3–51.6)
Hemoglobin: 15.6 g/dL (ref 13.1–17.2)
MCH: 32 pg (ref 26–34)
MCHC: 34 g/dL (ref 31–36)
MCV: 96 fL — ABNORMAL HIGH (ref 80–95)
MPV: 10.7 fL (ref 9.0–13.0)
Platelets: 151 10*3/uL (ref 140–440)
RBC: 4.81 M/uL (ref 3.80–5.80)
RDW: 11.9 % (ref 10.0–15.5)
WBC: 6.1 10*3/uL (ref 4.0–11.0)

## 2021-12-19 LAB — TSH: TSH: 1.36 uU/mL (ref 0.27–4.20)

## 2021-12-19 LAB — HEMOGLOBIN A1C
AVERAGE GLUCOSE: 123 mg/dL (ref 91–123)
Estimated Avg Glucose, External: 123 mg/dL (ref 91–123)
Hemoglobin A1C, External: 5.9 % — ABNORMAL HIGH (ref 4.8–5.6)
Hemoglobin A1C: 5.9 % — ABNORMAL HIGH (ref 4.8–5.6)

## 2021-12-19 LAB — LIPID PANEL
Chol/HDL Ratio: 3.3 (ref 0.0–5.0)
Cholesterol: 210 mg/dL — ABNORMAL HIGH (ref 110–200)
HDL: 64 mg/dL (ref >=40)
LDL Calculated: 132 mg/dL — ABNORMAL HIGH (ref 50–99)
LDL/HDL Ratio: 2.1
Non-HDL Cholesterol: 146 mg/dL — ABNORMAL HIGH (ref <130)
Triglycerides: 66 mg/dL (ref 40–149)
VLDL Cholesterol Calculated: 13 mg/dL (ref 8–30)

## 2021-12-19 LAB — T4, FREE: T4 Free: 1 ng/dL (ref 0.9–1.8)

## 2021-12-19 LAB — MAGNESIUM: Magnesium: 2 mg/dL (ref 1.6–2.5)

## 2021-12-28 ENCOUNTER — Encounter

## 2021-12-28 ENCOUNTER — Ambulatory Visit: Payer: PRIVATE HEALTH INSURANCE | Attending: Physician Assistant | Primary: Family Medicine

## 2021-12-28 MED ORDER — OXYCODONE-ACETAMINOPHEN 5-325 MG PO TABS
5-325 MG | ORAL_TABLET | ORAL | 0 refills | Status: AC | PRN
Start: 2021-12-28 — End: 2022-01-04

## 2021-12-28 NOTE — Progress Notes (Signed)
History and Physical Performed today and documented in chart    Brandon Montoya Brandon Aliea Bobe, PA-C  12/28/2021

## 2021-12-28 NOTE — H&P (Signed)
HISTORY AND PHYSICAL          Patient: Brandon Montoya                MRN: [id number]       SSN: [id number]  Date of Birth: 05-Aug-1965          AGE: 56 y.o.          SEX: male      Patient scheduled for: Right elbow common extensor tendon repair with platelet rich plasma injection  Surgeon: Orie Rout MD    ANESTHESIA TYPE:  General    HISTORY:     The patient was seen in the office today for a preoperative history and physical for an upcoming above listed surgery.  The patient is a pleasant 56 y.o. male who has a history of right elbow pain. Pain is a 9/10. Pain has been present since July. Was diagnosed with tennis elbow while living in [place]. Had an injection in August with no benefit. Denies injury. Pt notes pain while straightening elbow and picking up objects. Pt notes that he used to fall and pass out a a lot. He notes a history of back surgery January this year.     Pt notes painful popping when moving his right elbow. Pt endorses night pain. He wishes to undergo surgery.       Due to the current findings, affected activity of daily living and continued pain and discomfort, surgical intervention is indicated. The alternatives, risks, and complications, including but not limited to infection, blood loss, need for blood transfusion, neurovascular damage, peri-incisional numbness, subcutaneous hematoma, bone fracture, anesthetic complications, DVT, PE, death, RSD, postoperative stiffness and pain, possible surgical scar, delayed healing and nonhealing, reflexive sympathetic dystrophy, damage to blood vessels and nerves, need for more surgery, MI, and stroke,  failure of hardware, gait disturbances,have been discussed.  The patient understands and wishes to proceed with surgery.     PAST MEDICAL HISTORY:     Past Medical History:   Diagnosis Date    PTSD (post-traumatic stress disorder)         CURRENT MEDICATIONS:     Current Outpatient Medications   Medication Sig    oxyCODONE-acetaminophen  (PERCOCET) 5-325 MG per tablet Take 1 tablet by mouth every 4 hours as needed for Pain for up to 7 days. Take lowest dose possible to manage pain for acute post operative pain. Do not take until after surgery Max Daily Amount: 6 tablets    acetaminophen (TYLENOL) 500 MG tablet Take 2 tablets by mouth every 8 hours as needed    ALPRAZolam (XANAX) 1 MG tablet Take 1 tablet by mouth Every Day.    clonazePAM (KLONOPIN) 1 MG tablet Take 2 tablets by mouth 2 times daily.    metoprolol succinate (TOPROL XL) 25 MG extended release tablet Take 1 tablet by mouth as needed    FLUoxetine (PROZAC) 10 MG capsule Take 8 capsules by mouth daily    sildenafil (REVATIO) 20 MG tablet Take 1 tablet by mouth as needed    solifenacin (VESICARE) 5 MG tablet Take 2 tablets by mouth daily    dicyclomine (BENTYL) 20 MG tablet Take 1 tablet by mouth every 6 hours    ARIPiprazole (ABILIFY) 2 MG tablet Take 1 tablet by mouth daily    montelukast (SINGULAIR) 10 MG tablet Take 1 tablet by mouth nightly    pantoprazole (PROTONIX) 40 MG tablet Take 1 tablet by mouth daily     No current  facility-administered medications for this visit.         ALLERGIES:     Allergies   Allergen Reactions    Eletriptan Anaphylaxis, Shortness Of Breath and Swelling    Indomethacin Other (See Comments) and Nausea And Vomiting         SURGICAL HISTORY:     No past surgical history on file.     SOCIAL HISTORY:     Social History       Tobacco History       Smoking Status  Never      Smokeless Tobacco Use  Never              Alcohol History       Alcohol Use Status  Not Asked              Drug Use       Drug Use Status  Not Asked              Sexual Activity       Sexually Active  Not Asked                     FAMILY HISTORY:     No family history on file.    REVIEW OF SYSTEMS:     Negative for fevers, chills, chest pain, shortness of breath, weight loss, recent illness     General: Negative for fever and chills. No unexpected change in weight.  Denies fatigue. No  change in appetite.   Skin: Negative for rash or itching.   HEENT: Negative for congestion, sore throat, neck pain and neck stiffness. No change in vision or hearing. Hasn't noted any enlarged lymph nodes in the neck.  Cardiovascular:  Negative for chest pain and palpitations.  Has not noted pedal edema.  Respiratory: Negative for cough, colds, sinus, hemoptysis, shortness of breath and wheezing.  Gastrointestinal: Negative for nausea and vomiting, rectal bleeding, coffee ground emesis, abdominal pain, diarrhea and constipation.   Genitourinary: Negative for dysuria, frequency urgency, or burning on micturition. No flank pain, no foul smelling urine, no difficulty with initiating urination.   Hematological: Negative for bleeding or easy bruising.  Musculoskeletal: Negative  for arthralgias, back pain or neck pain.  Neurological: Negative for dizziness, seizures or syncopal episodes. Denies headaches.   Endocrine: Denies excessive thirst.  No heat/cold intolerance.  Psychiatric: Negative for depression or insomnia.    PHYSICAL EXAMINATION:     VITALS: There were no vitals taken for this visit.  GEN:  Well developed, well nourished 56 y.o. male in no acute distress.   HEENT: Normocephalic and atraumatic.Eyes: Conjunctivae and EOM are normal.Pupils are equal, round, and reactive to light. External ear normal appearance, external nose normal appearing. Mouth/Throat: Oropharynx is clear and moist, able to handle oral secretions w/out difficulty, airway patent  NECK: Supple. Normal ROM, No lymphadenopathy. Trachea is midline. No bruising, swelling or deformity  RESP: Clear to auscultation bilaterally. No wheezes, rales, rhonchi. Normal effort and breath sounds. No respiratory distress  CARDIO:  Normal rate, regular rhythm and normal heart sounds. No MGR.  ABDOMEN: Soft, non-tender, non-distended, normoactive bowel sounds in all four quadrants. There is no tenderness. There is no rebound and no guarding.   BACK: No CVA or  spinal tenderness  BREAST:  Deferred  PELVIC:    Deferred   RECTAL:  Deferred   GU:           Deferred  EXTREMITIES: EXAMINATION OF: right elbow  Examination Right Elbow   Skin Intact   Range of Motion 20-110   Tenderness Medial Epicondyle -   Tenderness Lateral Epicondyle +   Tenderness Olecranon Bursa -   Swelling -   Bruising -   Stability Normal   Motor Strength  Normal   Neurovascular Intact         NEUROVASCULAR: Sensation intact to light touch and strength grossly intact and symmetrical. No nystagmus. Positive distal pulses and capillary refill.   DVT ASSESSMENT:  There is not calf tenderness. No evidence of DVT seen on physical exam.  MOTOR: In tact  PSYCH: Alert an oriented to person, place and time. Mood, memory, affect, behavior and judgment normal       RADIOGRAPHS & DIAGNOSTIC STUDIES:     MRI/xray reveals :     IMAGING: MRI of the right elbow obtained [facility] dated 11/28/2021 reviewed and read by Dr. Idelle Crouch: Common extensor tendinopathy with low-grade interstitial tearing. Underlying  radial collateral ligament appears slightly thickened but largely intact.     Short segment of ulnar nerve thickening and edema like signal and cubital  tunnel. Correlate clinically for ulnar neuropathy.     Mild elbow degenerative changes.     XR of the right elbow with 3 views obtained in office dated 11/21/2021 reviewed and read by Dr. Idelle Crouch: No acute abnormalities      LABS:       CBC:   Lab Results   Component Value Date/Time    WBC 6.1 12/18/2021 10:25 AM    HGB 15.6 12/18/2021 10:25 AM    HCT 46.0 12/18/2021 10:25 AM    PLT 151 12/18/2021 10:25 AM    MCV 96 12/18/2021 10:25 AM     BMP:   Lab Results   Component Value Date/Time    NA 136 12/18/2021 10:25 AM    K 4.0 12/18/2021 10:25 AM    CL 98 12/18/2021 10:25 AM    CO2 28 12/18/2021 10:25 AM    BUN 9 12/18/2021 10:25 AM        Preoperative labs were reviewed and are substantially within normal limits   EKG: scanned into chart.       ASSESSMENT:       Encounter  Diagnosis   Name Primary?    Partial tear of common extensor tendon of right elbow Yes       PLAN:     Again, the alternatives, risks, and complications, as well as expected outcome were discussed. The patient understands and agrees to proceed with right elbow common extensor tendon repair with platelet rich plasma and. The patient was counseled at length about the risks of contracting Covid-19 during their perioperative period and any recovery window from their procedure.  The patient was made aware that contracting Covid-19  may worsen their prognosis for recovering from their procedure and lend to a higher morbidity and/or mortality risk.  All material risks, benefits, and reasonable alternatives including postponing the procedure were discussed. The patient does  wish to proceed with the procedure at this time.   Patient given orders listed below:    No orders of the defined types were placed in this encounter.        Burnett Corrente, PA-C  12/28/2021  12:45 PM

## 2021-12-28 NOTE — Telephone Encounter (Signed)
From: Joneen Caraway  To: Dr. Richardo Hanks  Sent: 12/28/2021 11:15 AM EST  Subject: Pain medication     Hello, Dr. Nicoletta Dress was supposed to send a Rx to [facility] for pain medicine. Could you please ask her if she could send it in now because my wife has to go pick hers up and I want her to pick up mine as well, thank you.

## 2021-12-28 NOTE — Telephone Encounter (Signed)
It has already been sent

## 2022-01-01 NOTE — Progress Notes (Addendum)
Instructions for your surgery at [facility] Date:  01/01/2022      Patient's Name:  Brandon Montoya           Surgery Date:  01/03/2022              Please enter the main entrance of the hospital and check-in at the front desk located in the lobby. Once checked in at the front desk, you will take the elevators to the second floor, and report to the waiting room on the left. The room will say Procedure Registration.    Do NOT eat or drink anything, including candy, gum, or ice chips after midnight prior to your surgery, unless you have specific instructions from your surgeon or anesthesia provider to do so.  Brush your teeth before coming to the hospital. You may swish with water, but do not swallow.  No smoking/Vaping/E-Cigarettes 24 hours prior to the day of surgery.  No alcohol 24 hours prior to the day of surgery.  No recreational drugs for one week prior to the day of surgery.  Bring Photo ID, Insurance information, and Co-pay if required on day of surgery.  Bring in pertinent legal documents, such as, Medical Power of [place], DNR, Advance Directive, etc.  Leave all valuables, including money/purse, at home.  Remove all jewelry, including ALL body piercings, nail polish, acrylic nails, and makeup (including mascara); no lotions, powders, deodorant, or perfume/cologne/after shave on the skin.  Follow instruction for Hibiclens washes and CHG wipes from surgeon's office.   Glasses and dentures may be worn to the hospital. They must be removed prior to surgery. Please bring case/container for glasses or dentures.   Contact lenses should not be worn on day of surgery.   Call your doctor's office if symptoms of a cold or illness develop within 24-48 hours prior to your surgery.  Call your doctor's office if you have any questions concerning insurance or co-pays.  15. AN ADULT (relative or friend 59 years or older) [place].  16. Please make  arrangements for a responsible adult (18 years or older) to be with you for 24 hours after your surgery.   92. ONE VISITOR will be allowed in the waiting area during your surgery.  Exceptions may be made for surgical admissions, per nursing unit guidelines      Special Instructions:      Bring a list of CURRENT medications.    Follow instructions from the office regarding medications to take the morning of surgery.     Bring CPAP machine.      On day of surgery if you are running late, unable to make procedure time, or sick, please call the Pre-op department at [phone]    These surgical instructions were reviewed with patient during the PAT phone call.

## 2022-01-01 NOTE — Telephone Encounter (Signed)
Pt stated he spoke with Jana Half already.

## 2022-01-01 NOTE — Telephone Encounter (Signed)
Patient called and is requesting a call back states that he was not feeling well and was told to call the office back with an update on how he is feeling before his surgery on the 01/03/22.

## 2022-01-02 NOTE — Discharge Instructions (Signed)
Dr. Milton Ferguson Non-Arthroscopic  Postoperative Information      You will be given a prescription for pain medication.  It may be taken every 4-6 hours as needed for pain for the first 4-5 days.    A bandage was placed on your incision after surgery. You need to keep this incision clean and dry.  If you have a splint or cast on please keep this clean and dry as well.    The operated area may be sore and develop bruising over the next several days. You should expect swelling in the area. You may elevate the operated extremity and apply an icepack on top of the dressing to help minimize the swelling.     It is safe to take a shower two days after surgery but you must keep the operated area dry.       If you have a high temperature, unexpected pain, redness or swelling, or any drainage around your operated area, please call my office immediately.     Please make an appointment to return to my office in one week.    Dr. Milton Ferguson office number [phone]

## 2022-01-03 ENCOUNTER — Inpatient Hospital Stay: Payer: Medicare (Managed Care) | Attending: Orthopaedic Surgery

## 2022-01-03 LAB — URINE DRUG SCREEN
Amphetamine, Urine: NEGATIVE
Barbiturates, Urine: NEGATIVE
Benzodiazepines, Urine: NEGATIVE
Cocaine, Urine: POSITIVE — AB
Methadone, Urine: NEGATIVE
Opiates, Urine: NEGATIVE
PCP, Urine: NEGATIVE
THC, TH-Cannabinol, Urine: NEGATIVE

## 2022-01-03 MED ORDER — ACETAMINOPHEN 500 MG PO TABS
500 MG | Freq: Once | ORAL | Status: AC
Start: 2022-01-03 — End: 2022-01-03
  Administered 2022-01-03: 17:00:00 1000 mg via ORAL

## 2022-01-03 MED ORDER — FAMOTIDINE 20 MG PO TABS
20 MG | Freq: Once | ORAL | Status: AC
Start: 2022-01-03 — End: 2022-01-03
  Administered 2022-01-03: 17:00:00 20 mg via ORAL

## 2022-01-03 MED ORDER — STERILE WATER FOR INJECTION (MIXTURES ONLY)
1 g | Freq: Once | INTRAMUSCULAR | Status: DC
Start: 2022-01-03 — End: 2022-01-03

## 2022-01-03 MED ORDER — LACTATED RINGERS IV SOLN
INTRAVENOUS | Status: DC
Start: 2022-01-03 — End: 2022-01-03
  Administered 2022-01-03: 17:00:00 via INTRAVENOUS

## 2022-01-03 MED ORDER — LIDOCAINE HCL (PF) 1 % IJ SOLN
1 % | Freq: Once | INTRAMUSCULAR | Status: DC | PRN
Start: 2022-01-03 — End: 2022-01-03

## 2022-01-03 MED FILL — FAMOTIDINE 20 MG PO TABS: 20 MG | ORAL | Qty: 1

## 2022-01-03 MED FILL — CEFAZOLIN SODIUM 1 G IJ SOLR: 1 g | INTRAMUSCULAR | Qty: 2000

## 2022-01-03 MED FILL — ACETAMINOPHEN EXTRA STRENGTH 500 MG PO TABS: 500 MG | ORAL | Qty: 2

## 2022-01-03 MED FILL — LACTATED RINGERS IV SOLN: INTRAVENOUS | Qty: 1000

## 2022-01-04 MED ORDER — ATORVASTATIN CALCIUM 20 MG PO TABS
20 MG | ORAL_TABLET | Freq: Every day | ORAL | 3 refills | Status: DC
Start: 2022-01-04 — End: 2022-04-01

## 2022-01-08 ENCOUNTER — Encounter

## 2022-01-09 ENCOUNTER — Inpatient Hospital Stay: Admit: 2022-01-09 | Payer: MEDICARE | Primary: Family Medicine

## 2022-01-09 DIAGNOSIS — Z01812 Encounter for preprocedural laboratory examination: Secondary | ICD-10-CM

## 2022-01-09 DIAGNOSIS — Z01818 Encounter for other preprocedural examination: Secondary | ICD-10-CM

## 2022-01-09 LAB — URINE DRUG SCREEN
Amphetamine, Urine: NEGATIVE
Barbiturates, Urine: NEGATIVE
Benzodiazepines, Urine: NEGATIVE
Cocaine, Urine: NEGATIVE
Methadone, Urine: NEGATIVE
Opiates, Urine: POSITIVE — AB
PCP, Urine: NEGATIVE
THC, TH-Cannabinol, Urine: NEGATIVE

## 2022-01-11 NOTE — Telephone Encounter (Signed)
From: [name].  To: Rogelia Boga  Sent: 01/11/2022 9:56 AM EST  Subject: Urine Screen     I noticed that my test results showed abnormal results for opiates, can you tell me what that is?

## 2022-01-14 ENCOUNTER — Ambulatory Visit: Admit: 2022-01-14 | Discharge: 2022-01-14 | Payer: MEDICARE | Attending: Orthopaedic Surgery | Primary: Family Medicine

## 2022-01-14 DIAGNOSIS — M25512 Pain in left shoulder: Secondary | ICD-10-CM

## 2022-01-14 MED ORDER — BACLOFEN 10 MG PO TABS
10 MG | ORAL_TABLET | Freq: Three times a day (TID) | ORAL | 0 refills | Status: AC
Start: 2022-01-14 — End: 2022-05-03

## 2022-01-14 MED ORDER — METHYLPREDNISOLONE 4 MG PO TBPK
4 MG | PACK | ORAL | 0 refills | Status: DC
Start: 2022-01-14 — End: 2022-02-20

## 2022-01-14 MED ORDER — TOPIRAMATE 25 MG PO TABS
25 MG | ORAL_TABLET | Freq: Every evening | ORAL | 0 refills | Status: AC
Start: 2022-01-14 — End: 2022-05-20

## 2022-01-14 NOTE — Telephone Encounter (Signed)
Thanks, I'm taking hydromorphone.

## 2022-01-14 NOTE — Progress Notes (Addendum)
Brandon Montoya.  July 22, 1965   Chief Complaint   Patient presents with    Shoulder Pain     Left          HISTORY OF PRESENT ILLNESS  Brandon Montoya. is a 57 y.o. male who presents today for evaluation of left shoulder pain.  Pain is a 10/10. Pain has been present for about a year but the pain has worsened in the past week. Pt notes intermittent pain and denies injury. He claims to feel numbness and tingling that travels down his left arm. He states that his pain and numbness when his arm is relaxed. The patient describes pain when laying flat. He had back surgery 01/12/2021 in [place]. He denies being diabetic.     Has tried following treatments: Injections:No; Brace:No; Therapy:No; Cane/Crutch:No      Allergies   Allergen Reactions    Eletriptan Anaphylaxis, Shortness Of Breath and Swelling    Indomethacin Other (See Comments) and Nausea And Vomiting        Past Medical History:   Diagnosis Date    OSA on CPAP     PTSD (post-traumatic stress disorder)     Tachycardia     pt stated resolved-not on meds anymore.      Social History       Tobacco History       Smoking Status  Former Smoking Tobacco Type  Cigarettes      Smokeless Tobacco Use  Never              Alcohol History       Alcohol Use Status  Yes Comment  rarely              Drug Use       Drug Use Status  Not Currently Types  Marijuana (Weed) Comment  Medical marijuana in the past              Sexual Activity       Sexually Active  Not Asked                   Past Surgical History:   Procedure Laterality Date    CHOLECYSTECTOMY  2019    KNEE ARTHROPLASTY Bilateral 2019    with revisions    MASTECTOMY Right     due to gynecomastia      No family history on file.  Current Outpatient Medications   Medication Sig    atorvastatin (LIPITOR) 20 MG tablet Take 1 tablet by mouth daily    acetaminophen (TYLENOL) 500 MG tablet Take 2 tablets by mouth every 8 hours as needed (Patient not taking: Reported on 01/03/2022)    ALPRAZolam (XANAX) 1 MG  tablet Take 1 tablet by mouth 3 times daily as needed.    clonazePAM (KLONOPIN) 1 MG tablet Take 2 tablets by mouth 2 times daily.    metoprolol succinate (TOPROL XL) 25 MG extended release tablet Take 1 tablet by mouth as needed (Patient not taking: Reported on 01/01/2022)    FLUoxetine (PROZAC) 10 MG capsule Take 8 capsules by mouth daily    sildenafil (REVATIO) 20 MG tablet Take 1 tablet by mouth as needed    solifenacin (VESICARE) 5 MG tablet Take 2 tablets by mouth daily (Patient not taking: Reported on 01/01/2022)    dicyclomine (BENTYL) 20 MG tablet Take 1 tablet by mouth every 6 hours    ARIPiprazole (ABILIFY) 2 MG tablet Take 1 tablet by mouth daily  montelukast (SINGULAIR) 10 MG tablet Take 1 tablet by mouth nightly    pantoprazole (PROTONIX) 40 MG tablet Take 1 tablet by mouth daily     No current facility-administered medications for this visit.       REVIEW OF SYSTEM   Patient denies: Weight loss, Fever/Chills, HA, Visual changes, Fatigue, Chest pain, SOB, Abdominal pain, N/V/D/C, Blood in stool or urine, Edema.   Pertinent positive as above in HPI. All others were negative    PHYSICAL EXAM:   Ht 1.829 m (6')   BMI 32.14 kg/m   The patient is a well-developed, well-nourished male   in no acute distress.  The patient is alert and oriented times three.  The patient is alert and oriented times three. Mood and affect are normal.  LYMPHATIC: lymph nodes are not enlarged and are within normal limits  SKIN: normal in color and non tender to palpation. There are no bruises or abrasions noted.   NEUROLOGICAL: Motor sensory exam is within normal limits. Reflexes are equal bilaterally. There is normal sensation to pinprick and light touch  MUSCULOSKELETAL:  Examination Left shoulder Right shoulder   Skin Intact Intact   AC joint tenderness - -   Biceps tenderness - -   Forward flexion/Elevation ROM 180 180   Active abduction ROM 160 160   Glenohumeral abduction 90 90   External rotation ROM 90 90   Internal  rotation ROM 70 70   Apprehension - -   Jobe's Relocation - -   Jerk - -   Load and Shift - -   O'briens - -   Speeds - -   Impingement sign - -   Supraspinatus/Empty Can -, 5/5 -, 5/5   External Rotation Strength -, 5/5 -, 5/5   Decreased range of motion of the cervical spine with positive Spurling maneuver  IMAGING: XR of the cervical spine with 2-3 views obtained in office dated 01/14/2022 reviewed and read by Dr. Domingo Pulse: Marked spondylotic changes from C4-C7 with loss of cervical lordosis    XR of the left shoulder with 3 views obtained in office dated 01/14/2022 reviewed and read by Dr. Domingo Pulse: No acute abnormalities    IMPRESSION:      ICD-10-CM    1. Chronic left shoulder pain  M25.512 [72040] C Spine 2-3V    G89.29 [73030] Shoulder 2V or more      2. Herniated disc, cervical  M50.20            PLAN:   1. Pt presented today with left shoulder pain and numbness severe pain secondary to herniated disc. I suspect that this may be caused by a pinched nerve. I will order an MRI of his cervical spine to r/o a herniated disc. I will also prescribe him Topamax 25 mg QPM and Baclofen 10 mg TID. Additionally, I will prescribe him with an MDP.  Will have Dr. Ma Rings see him as soon as possible see him back after the MRI.  Will also add tramadol for pain relief  Risk factors include: BMI>30   2. No cortisone injection indicated today  3. No Physical/Occupational Therapy indicated today  4. Yes diagnostic test indicated today MRI cervical spine  5. No durable medical equipment indicated today  6. No referral indicated today   7. Yes medications indicated today: Baclofen 10 mg TID, Topamax 25 mg QPM, MDP  8. No Narcotic indicated today.     RTC after MRI       Scribed by  Arvilla Market ([place]) as dictated by Richardo Hanks, MD    I, Dr. Richardo Hanks, confirm that all documentation is accurate.    Richardo Hanks, M.D.   [facility]

## 2022-01-15 ENCOUNTER — Encounter

## 2022-01-15 MED ORDER — QUETIAPINE FUMARATE 200 MG PO TABS
200 MG | ORAL_TABLET | Freq: Every evening | ORAL | 1 refills | Status: AC
Start: 2022-01-15 — End: 2022-07-30

## 2022-01-15 MED ORDER — TRAMADOL HCL 50 MG PO TABS
50 MG | ORAL_TABLET | ORAL | 0 refills | Status: AC | PRN
Start: 2022-01-15 — End: 2022-01-22

## 2022-01-15 NOTE — Telephone Encounter (Signed)
Spoke w/ pt and let him know more meds were sent in to [facility]

## 2022-01-15 NOTE — Addendum Note (Signed)
Addended by: Richardo Hanks on: 01/15/2022 10:48 AM     Modules accepted: Orders

## 2022-01-15 NOTE — Telephone Encounter (Signed)
Brandon Montoya sent a message via MyChart to Dr Elta Guadeloupe requesting medication. Advised Brandon Montoya the physician has 24-48hrs to respond to messages, please follow up when available

## 2022-01-15 NOTE — Telephone Encounter (Signed)
Patient called for Dr.Luciano.    Patient said that he saw Dr.Luciano yesterday for his left shoulder. That Dr.Luciano ordered an mri.  Patient said he is scheduled to have the mri done on 01/23/22 at [facility]. Patient said that he is in a lot of pain and would like to have the mri done a lot sooner.    Patient said that Central Scheduling told him that if Dr.Luciano places a Stat order for an Mri of the Left shoulder, that they could schedule the mri for today or tomorrow.    Patient is asking if Dr.Luciano could place a stat order, so that he may have the mri done today or tomorrow.    Patient tel. [phone].

## 2022-01-15 NOTE — Telephone Encounter (Signed)
Patient has multiple encounters open    He was seen yesterday for neck pain and had three prescriptions called in, however, he says none of them were for pain and he needs a pain medication.  Please review and advise patient if we can prescribe a different medication for his pain, [phone].

## 2022-01-15 NOTE — Telephone Encounter (Signed)
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

## 2022-01-16 NOTE — Telephone Encounter (Addendum)
Patient states he called to follow up on his Stat MRI request for the left shoulder and cervical spine, and is asking to speak to a nurse. Patient can be reached at [phone].

## 2022-01-16 NOTE — Addendum Note (Signed)
Addended by: Morene Crocker on: 01/16/2022 01:35 PM     Modules accepted: Orders

## 2022-01-16 NOTE — Telephone Encounter (Signed)
Patient called again and is requesting a call back states that his STAT MRI order still has not been placed and that he can not keep going in pain like this and would like for someone to give him a call back as soon as possible, states he has called several times and is getting frustrated.           Please call and advise patient at   [phone]

## 2022-01-17 ENCOUNTER — Inpatient Hospital Stay: Admit: 2022-01-17 | Payer: MEDICARE | Attending: Orthopaedic Surgery | Primary: Family Medicine

## 2022-01-17 DIAGNOSIS — M5020 Other cervical disc displacement, unspecified cervical region: Secondary | ICD-10-CM

## 2022-01-18 NOTE — Telephone Encounter (Signed)
From: MA Vue Pavon G  To: [name].  Sent: 01/16/2022 1:55 PM EST  Subject: MRI / meds    Good afternoon,     Your MRI has been ordered stat. However , for the meds we have already sent Tramadol for the other med you requested per Dr. Domingo Pulse he doesn't prescribe that type of medicine.    [place]

## 2022-01-22 ENCOUNTER — Ambulatory Visit: Payer: MEDICARE | Attending: Physician Assistant | Primary: Family Medicine

## 2022-01-22 ENCOUNTER — Ambulatory Visit: Admit: 2022-01-22 | Discharge: 2022-01-22 | Payer: MEDICARE | Attending: Orthopaedic Surgery | Primary: Family Medicine

## 2022-01-22 DIAGNOSIS — M25512 Pain in left shoulder: Secondary | ICD-10-CM

## 2022-01-22 NOTE — Progress Notes (Signed)
Brandon Montoya.  1965-05-12   Chief Complaint   Patient presents with    Shoulder Pain     left        HISTORY OF PRESENT ILLNESS  Brandon Montoya. is a 57 y.o. male who presents today for reevaluation of left shoulder pain and MRI review. Pain is a 9/10. Pain has been present for about a year but the pain has worsened in the past week. Pt notes intermittent pain and denies injury. He claims to feel numbness and tingling that travels down his left arm. He states that his pain and numbness when his arm is relaxed. The patient describes pain when laying flat. He had back surgery 01/12/2021 in [place]. He denies being diabetic.      Patient denies any fever, chills, chest pain, shortness of breath or calf pain. The remainder of the review of systems is negative. There are no new illness or injuries to report since last seen in the office. No changes in medications, allergies, social or family history.      PHYSICAL EXAM:   There were no vitals taken for this visit.  The patient is a well-developed, well-nourished male   in no acute distress.  The patient is alert and oriented times three.  The patient is alert and oriented times three. Mood and affect are normal.  LYMPHATIC: lymph nodes are not enlarged and are within normal limits  SKIN: normal in color and non tender to palpation. There are no bruises or abrasions noted.   NEUROLOGICAL: Motor sensory exam is within normal limits. Reflexes are equal bilaterally. There is normal sensation to pinprick and light touch  MUSCULOSKELETAL:  Examination Left shoulder Right shoulder   Skin Intact Intact   AC joint tenderness - -   Biceps tenderness - -   Forward flexion/Elevation ROM 180 180   Active abduction ROM 160 160   Glenohumeral abduction 90 90   External rotation ROM 90 90   Internal rotation ROM 70 70   Apprehension - -   Jobe's Relocation - -   Jerk - -   Load and Shift - -   O'briens - -   Speeds - -   Impingement sign - -   Supraspinatus/Empty Can -, 5/5  -, 5/5   External Rotation Strength -, 5/5 -, 5/5   Decreased range of motion of the cervical spine with positive Spurling maneuver     IMAGING: MRI of the cervical spine obtained from [facility] dated 01/17/2022 reviewed and read by Dr. Domingo Pulse: C3-T1 notable degenerative disc disease including disc/osteophyte complexes.  Multifocal mild facet arthropathy.  -C6-C7 up to moderate spinal canal stenosis, and multifocal mild spinal canal  stenoses elsewhere.  -Multifocal moderate foraminal stenoses at C3-T1 levels.    XR of the cervical spine with 2-3 views obtained in office dated 01/14/2022 reviewed and read by Dr. Domingo Pulse: Marked spondylotic changes from C4-C7 with loss of cervical lordosis     XR of the left shoulder with 3 views obtained in office dated 01/14/2022 reviewed and read by Dr. Domingo Pulse: No acute abnormalities    IMPRESSION:      ICD-10-CM    1. Chronic left shoulder pain  M25.512     G89.29       2. Herniated disc, cervical  M50.20 Brandon Birks, MD, Spine Surgery, [facility]      3. Spinal stenosis of cervical region  M48.02       4. Foraminal stenosis of cervical region  M48.02            PLAN:   1. Pt presented today with left shoulder pain. I suspect that this is due to nerve etiology, so I will refer him to Dr. Ma Rings for evaluation and treatment.  Risk factors include: BMI>30   2. No cortisone injection indicated today   3. No Physical/Occupational Therapy indicated today  4. No diagnostic test indicated today:   5. No durable medical equipment indicated today  6. Yes referral indicated today Dr. Ma Rings  7. No medications indicated today:   8. No Narcotic indicated today.       RTC PRN       Scribed by Arvilla Market ([place]) as dictated by Richardo Hanks, MD    I, Dr. Richardo Hanks, confirm that all documentation is accurate.    Richardo Hanks, M.D.   [facility]

## 2022-01-23 ENCOUNTER — Ambulatory Visit: Payer: MEDICARE | Primary: Family Medicine

## 2022-01-24 ENCOUNTER — Ambulatory Visit: Payer: MEDICARE | Attending: Orthopaedic Surgery | Primary: Family Medicine

## 2022-02-18 ENCOUNTER — Ambulatory Visit: Payer: MEDICARE | Attending: Family Medicine | Primary: Family Medicine

## 2022-02-20 ENCOUNTER — Ambulatory Visit: Admit: 2022-02-20 | Discharge: 2022-02-20 | Payer: MEDICARE | Attending: Family Medicine | Primary: Family Medicine

## 2022-02-20 DIAGNOSIS — Z1211 Encounter for screening for malignant neoplasm of colon: Secondary | ICD-10-CM

## 2022-02-20 DIAGNOSIS — Z01818 Encounter for other preprocedural examination: Secondary | ICD-10-CM

## 2022-02-20 LAB — BASIC METABOLIC PANEL
Anion Gap: 10 mmol/L (ref 3.0–15.0)
BUN: 8 mg/dL (ref 6–22)
CO2: 27 mmol/L (ref 20–32)
Calcium: 9.5 mg/dL (ref 8.4–10.5)
Chloride: 103 mmol/L (ref 98–110)
Creatinine: 0.9 mg/dL (ref 0.5–1.2)
Glomerular Filtration Rate: >60 mL/min/{1.73_m2} (ref >60.0)
Glucose: 89 mg/dL (ref 70–99)
Potassium: 3.8 mmol/L (ref 3.5–5.5)
Sodium: 140 mmol/L (ref 133–145)

## 2022-02-20 LAB — CBC WITH AUTO DIFFERENTIAL
Basophils Absolute: 0 10*3/uL (ref 0.0–0.2)
Basophils: 0 % (ref 0–2)
Eosinophils Absolute: 0.1 10*3/uL (ref 0.0–0.5)
Eosinophils: 2 % (ref 0–6)
Hematocrit: 43.1 % (ref 39.3–51.6)
Hemoglobin: 14.4 g/dL (ref 13.1–17.2)
Lymphocytes Absolute: 2.2 10*3/uL (ref 1.0–4.8)
Lymphocytes: 39 % (ref 20–45)
MCH: 32 pg (ref 26–34)
MCHC: 33 g/dL (ref 31–36)
MCV: 95 fL (ref 80–95)
MPV: 10 fL (ref 9.0–13.0)
Monocytes Absolute: 0.5 10*3/uL (ref 0.1–1.0)
Monocytes: 9 % (ref 3–12)
Neutrophils Absolute: 2.8 10*3/uL (ref 1.8–7.7)
Neutrophils: 49 % (ref 40–75)
Platelets: 170 10*3/uL (ref 140–440)
RBC: 4.52 M/uL (ref 3.80–5.80)
RDW: 12.3 % (ref 10.0–15.5)
WBC: 5.6 10*3/uL (ref 4.0–11.0)

## 2022-02-20 NOTE — Progress Notes (Unsigned)
"  Have you been to the ER, urgent care clinic since your last visit?  Hospitalized since your last visit?"    NO    "Have you seen or consulted any other health care providers outside of [facility] since your last visit?"    NO    "Have you had a colorectal cancer screening such as a colonoscopy/FIT/Cologuard?    YES - Type: Colonoscopy - Where: 2020 Copy made and sent to scan   Nurse/CMA to request most recent records if not in the chart

## 2022-02-20 NOTE — Progress Notes (Signed)
HPI  Brandon Montoya. comes in for preop evaluation and Medicare wellness exam.  Referring physician: Dr. [name]  Date of procedure: 03/04/2022  Planned procedure: Anterior cervical decompression fusion C6/7, C7/T1  Indication for procedure: Cervicalgia and cervical neuropathy.    HPI: Brandon Montoya. is a patient who has cervical radiculopathy with severe pain left arm.  He has been followed up by the  neurosurgeon.  He has had supportive care measures, conservative measures done but symptoms persist.  He had an MRI done of the cervical spine and is now scheduled to have above procedure done.  He comes in for preop evaluation.  Previous EKG done has been within normal limits.  His lab work is stable.  CBC and BMP are reassuring.  Patient is medically stabilized and cleared to proceed with planned procedure.      Dyslipidemia: Patient has dyslipidemia.  He takes atorvastatin 20 mg daily.  Stable on the medication.  He will exercise and take a diet low in polysaturated fats.  Mood disorder: Patient has a history of mood disorder and PTSD.  He has been followed up by behavioral health specialist.  He is on Seroquel, Topamax, clonazepam, Xanax, fluoxetine, Abilify.  He will continue with management as recommended by his specialist.  Chronic pain: Patient has a history of chronic neck and back pain.  He has been on medications including Percocet, OxyContin, baclofen, Topamax.  He will continue with management as recommended by his specialist.  LUTS: Patient has lower urinary tract symptoms.  He is on Flomax daily.  We will continue current treatment plan.  Denies dysuria or hematuria.  Obesity: Patient has a BMI of 33.63.  He will intensify lifestyle and dietary modification.      Past Medical History  Past Medical History:   Diagnosis Date    Chronic pain     Marijuana use     positive for Opioids 01/09/2022    OSA (obstructive sleep apnea)     pt has Cpap, but does not use    PTSD (post-traumatic stress  disorder)     Tachycardia     pt stated resolved-not on meds anymore.       Surgical History  Past Surgical History:   Procedure Laterality Date    CHOLECYSTECTOMY  2019    COLONOSCOPY      KNEE ARTHROPLASTY Bilateral 2019    with revisions    MASTECTOMY Right     due to gynecomastia        Medications  Current Outpatient Medications   Medication Sig Dispense Refill    oxyCODONE (OXYCONTIN) 10 MG extended release tablet Take 1 tablet by mouth every 12 hours. Max Daily Amount: 20 mg      oxyCODONE-acetaminophen (PERCOCET) 10-325 MG per tablet TAKE 1 TAB BY MOUTH EVERY 4 HOURS AS NEEDED      QUEtiapine (SEROQUEL) 200 MG tablet Take 1 tablet by mouth nightly 90 tablet 1    baclofen (LIORESAL) 10 MG tablet Take 1 tablet by mouth 3 times daily 90 tablet 0    topiramate (TOPAMAX) 25 MG tablet Take 1 tablet by mouth nightly 30 tablet 0    atorvastatin (LIPITOR) 20 MG tablet Take 1 tablet by mouth daily 30 tablet 3    ALPRAZolam (XANAX) 1 MG tablet Take 1 tablet by mouth 3 times daily as needed.      clonazePAM (KLONOPIN) 1 MG tablet Take 2 tablets by mouth 2 times daily.  FLUoxetine (PROZAC) 10 MG capsule Take 8 capsules by mouth daily      sildenafil (REVATIO) 20 MG tablet Take 1 tablet by mouth as needed      dicyclomine (BENTYL) 20 MG tablet Take 1 tablet by mouth every 6 hours      ARIPiprazole (ABILIFY) 2 MG tablet Take 1 tablet by mouth daily      montelukast (SINGULAIR) 10 MG tablet Take 1 tablet by mouth nightly      tamsulosin (FLOMAX) 0.4 MG capsule Take 1 capsule by mouth nightly       No current facility-administered medications for this visit.       Allergies  Allergies   Allergen Reactions    Eletriptan Anaphylaxis, Shortness Of Breath and Swelling    Indomethacin Other (See Comments) and Nausea And Vomiting       Family History  History reviewed. No pertinent family history.    Social History  Social History     Socioeconomic History    Marital status: Single     Spouse name: Not on file    Number of  children: Not on file    Years of education: Not on file    Highest education level: Not on file   Occupational History    Not on file   Tobacco Use    Smoking status: Some Days     Types: Cigarettes, Cigars     Start date: 2000     Passive exposure: Current    Smokeless tobacco: Never   Vaping Use    Vaping Use: Never used   Substance and Sexual Activity    Alcohol use: Yes     Comment: rarely    Drug use: Not Currently     Types: Marijuana Sherrie Mustache)     Comment: Medical marijuana in the past, positive for Opioids 01/09/2022    Sexual activity: Not on file   Other Topics Concern    Not on file   Social History Narrative    Not on file     Social Determinants of Health     Financial Resource Strain: Low Risk  (02/20/2022)    Overall Financial Resource Strain (CARDIA)     Difficulty of Paying Living Expenses: Not hard at all   Food Insecurity: No Food Insecurity (02/20/2022)    Hunger Vital Sign     Worried About Running Out of Food in the Last Year: Never true     [place] in the Last Year: Never true   Transportation Needs: Unknown (02/20/2022)    PRAPARE - Armed forces logistics/support/administrative officer (Medical): Not on file     Lack of Transportation (Non-Medical): No   Physical Activity: Inactive (02/20/2022)    Exercise Vital Sign     Days of Exercise per Week: 0 days     Minutes of Exercise per Session: 0 min   Stress: Not on file   Social Connections: Not on file   Intimate Partner Violence: Not on file   Housing Stability: Unknown (02/20/2022)    Housing Stability Vital Sign     Unable to Pay for Housing in the Last Year: Not on file     Number of Places Lived in the Last Year: Not on file     Unstable Housing in the Last Year: No       Review of Systems  Review of Systems - Review of all systems is negative except as noted above in the  HPI.    Vital Signs  BP 110/81   Pulse 90   Temp 97.8 F (36.6 C)   Resp 20   Ht 1.829 m (6')   Wt 112.5 kg (248 lb)   SpO2 98%   BMI 33.63 kg/m       Physical  Exam  Physical Examination: General appearance - alert, well appearing, and in no distress, oriented to person, place, and time, overweight, and acyanotic, in no respiratory distress  Mental status - alert, oriented to person, place, and time, affect appropriate to mood  Neck -paracervical muscle tightness  Lymphatics - no palpable lymphadenopathy, no hepatosplenomegaly  Chest - clear to auscultation, no wheezes, rales or rhonchi, symmetric air entry  Heart - S1 and S2 normal, no murmurs noted  Abdomen - no rebound tenderness noted  Back exam - limited range of motion  Neurological -neuropathic pain left upper extremity  Musculoskeletal - osteoarthritic changes noted in both hands  Extremities - intact peripheral pulses      Results  No results found for this visit on 02/20/22.    ASSESSMENT and PLAN    ICD-10-CM    1. Preop examination  Z01.818 CBC with Auto Differential     Basic Metabolic Panel     EKG 12 Lead      2. Cervicalgia  M54.2 CBC with Auto Differential     Basic Metabolic Panel      3. Hyperlipidemia, unspecified hyperlipidemia type  E78.5       4. Screen for colon cancer  Z12.11 [place] - Cynda Familia, MD, Colon & Rectal Surgery, [place]      5. PTSD (post-traumatic stress disorder)  F43.10       6. Other chronic pain  G89.29       7. Bipolar 1 disorder (HCC)  F31.9       8. Lower urinary tract symptoms (LUTS)  R39.9       9. Obesity (BMI 30-39.9)  E66.9       lab results and schedule of future lab studies reviewed with patient  reviewed diet, exercise and weight control  cardiovascular risk and specific lipid/LDL goals reviewed  reviewed medications and side effects in detail  I spent 30 minutes with this established patient.    I have discussed the diagnosis with the patient and the intended plan of care as seen in the above orders. The patient has received an after-visit summary and questions were answered concerning future plans. I have discussed medication, side effects, and warnings with the  patient in detail. The patient verbalized understanding and is in agreement with the plan of care. The patient will contact the office with any additional concerns.    Carisma Troupe [place], MD    PLEASE NOTE:   This document has been produced using voice recognition software. Unrecognized errors in transcription may be present

## 2022-02-20 NOTE — Progress Notes (Signed)
Instructions for your surgery at [facility]      Today's Date:  02/20/2022      Patient's Name:  Brandon Montoya.           Surgery Date:  03/04/2022              Please enter the main entrance of the hospital and check-in at the front desk located in the lobby. Once checked in at the front desk, you will take the elevators to the second floor, and report to the waiting room on the left. The room will say Procedure Registration.    Do NOT eat or drink anything, including candy, gum, or ice chips after midnight prior to your surgery, unless you have specific instructions from your surgeon or anesthesia provider to do so.  Brush your teeth before coming to the hospital. You may swish with water, but do not swallow.  No smoking/Vaping/E-Cigarettes 24 hours prior to the day of surgery.  No alcohol 24 hours prior to the day of surgery.  No recreational drugs for one week prior to the day of surgery.  Bring Photo ID, Insurance information, and Co-pay if required on day of surgery.  Bring in pertinent legal documents, such as, Medical Power of [place], DNR, Advance Directive, etc.  Leave all valuables, including money/purse, at home.  Remove all jewelry, including ALL body piercings, nail polish, acrylic nails, and makeup (including mascara); no lotions, powders, deodorant, or perfume/cologne/after shave on the skin.  Follow instruction for Hibiclens washes and CHG wipes from surgeon's office.   Glasses and dentures may be worn to the hospital. They must be removed prior to surgery. Please bring case/container for glasses or dentures.   Contact lenses should not be worn on day of surgery.   Call your doctor's office if symptoms of a cold or illness develop within 24-48 hours prior to your surgery.  Call your doctor's office if you have any questions concerning insurance or co-pays.  15. AN ADULT (relative or friend 60 years or older) [place].  16. Please make  arrangements for a responsible adult (18 years or older) to be with you for 24 hours after your surgery.   17. ONE VISITOR will be allowed in the waiting area during your surgery.  Exceptions may be made for surgical admissions, per nursing unit guidelines      Special Instructions:      Bring a list of CURRENT medications.    Follow instructions from the office regarding medications to take the morning of surgery.   Bring inhaler.  Bring CPAP machine       On day of surgery if you are running late, unable to make procedure time, or sick, please call the Pre-op department at [phone]    These surgical instructions were reviewed with Joneen Caraway during the PAT phone call.

## 2022-04-01 MED ORDER — ATORVASTATIN CALCIUM 20 MG PO TABS
20 MG | ORAL_TABLET | Freq: Every day | ORAL | 1 refills | Status: AC
Start: 2022-04-01 — End: 2022-07-30

## 2022-05-03 NOTE — Other (Signed)
Instructions for your surgery at [facility]      Today's Date:  05/03/2022      Patient's Name:  Brandon Montoya.           Surgery Date:  05/20/22              Please enter the main entrance of the hospital and check-in at the front desk located in the lobby. Once checked in at the front desk, you will take the elevators to the second floor, and report to the waiting room on the left. The room will say Procedure Registration.    Do NOT eat or drink anything, including candy, gum, or ice chips after midnight prior to your surgery, unless you have specific instructions from your surgeon or anesthesia provider to do so.  Brush your teeth before coming to the hospital. You may swish with water, but do not swallow.  No smoking/Vaping/E-Cigarettes 24 hours prior to the day of surgery.  No alcohol 24 hours prior to the day of surgery.  No recreational drugs for one week prior to the day of surgery.  Bring Photo ID, Insurance information, and Co-pay if required on day of surgery.  Bring in pertinent legal documents, such as, Medical Power of [place], DNR, Advance Directive, etc.  Leave all valuables, including money/purse, at home.  Remove all jewelry, including ALL body piercings, nail polish, acrylic nails, and makeup (including mascara); no lotions, powders, deodorant, or perfume/cologne/after shave on the skin.  Follow instruction for Hibiclens washes and CHG wipes from surgeon's office.   Glasses and dentures may be worn to the hospital. They must be removed prior to surgery. Please bring case/container for glasses or dentures.   Contact lenses should not be worn on day of surgery.   Call your doctor's office if symptoms of a cold or illness develop within 24-48 hours prior to your surgery.  Call your doctor's office if you have any questions concerning insurance or co-pays.  15. AN ADULT (relative or friend 18 years or older) MUST DRIVE YOU HOME AFTER YOUR SURGERY.  16. Please make  arrangements for a responsible adult (18 years or older) to be with you for 24 hours after your surgery.   17. ONE VISITOR will be allowed in the waiting area during your surgery.  Exceptions may be made for surgical admissions, per nursing unit guidelines      Special Instructions:      Bring a list of CURRENT medications.  Follow instructions from the office regarding Blood Thinners and/or Insulin  Follow instructions from the office regarding medications to take the morning of surgery.   .     On day of surgery if you are running late, unable to make procedure time, or sick, please call the Pre-op department at [phone]    These surgical instructions were reviewed with patient during the PAT phone call.

## 2022-05-17 NOTE — Telephone Encounter (Signed)
Call placed to patient, ID verified x 2. Patient  has decided with their surgeon to have cervical surgery to decrease  pain and improve mobility . Topics discussed included surgery preparation, what to expect the day of surgery, medications, physical and occupational therapy, and discharge planning.  It was discussed that this is considered an elective surgery and that prior to the surgery  decisions such as arranging for help at home once they are discharged needs to be made.Patient agreed to get home ready for surgery and to have a ride arranged to go home. He identifies his fiance as his support system, has a walker at home and was unsure if he was spending the night or not as he was told it was dependent on if a drain was placed or not. He lives in a one story home with 4 steps to enter. Instructions were given for CHG bathing. Patient states that he will shower with dial soap. Patient will complete the procedure the morning of surgery.  Patient was reminded not to apply any deoderant,  or lotions to skin the morning of surgery. He will remain NPO after midnight and  will take only the medications as instructed to take by his surgeon the morning of surgery with a sip of water.Patient verbalized that he knew which medications to take the morning of surgery with a sip of water. A spine surgery   education book will be provided to patient post op prior to discharge if one was not provided at the clinic level.  Education regarding the importance of early and frequent ambulation to avoid surgical complications and to assist with pain was provided. Recommended the use of ice to assist with pain and swelling post op for 20 minutes an hour, not to be placed directly on his skin. Reviewed the purpose of soft collar wear, postoperative showering instructions, and the need to stick with soft foods for the first few days.  Patient verbalized understanding of all information provided.  Opportunity was given to ask questions  and phone number of the Orthopaedic Program Coordinator  was given for any questions or concerns that may arise later.

## 2022-05-17 NOTE — Telephone Encounter (Signed)
Attempted to reach patient regarding preoperative education. VM left to return call to coordinator.

## 2022-05-17 NOTE — H&P (Signed)
Pre-Admission History and Physical    Patient: Brandon Montoya.   MRN: [id number]   SSN: [id number]   Date of Birth: Jun 29, 1965   Age: 57 y.o.   Sex: male     Patient scheduled for: anterior cervical decompression and fusion cervical six/seven, cervical seven/thoracic one.  Date of surgery: 05/20/22.  Surgeon: Pamalee Leyden, MD    HPI:  Brandon Fleeger. is a 57 y.o. male with continuing to get severe radiating left arm pain that is now 10/10. It is throughout his entire left arm from a radicular distribution mainly in low cervical across his triceps. MRI demonstrates worse pathology at C6/7, but also present at C7/T1 with foraminal stenosis, particularly degenerative changes at C3/4, C4/5, C5/6 as well.   This patient has failed the presurgical conservative treatments  including physical therapy, spinal block injections and medications. Pain has impacted the patient's functional ability. He is being admitted for surgical intervention.         Past Medical History:   Diagnosis Date    Arthritis     Bipolar 1 disorder (HCC)     Chronic pain     History of blood transfusion     Marijuana use     positive for Opioids 01/09/2022    OSA (obstructive sleep apnea)     pt has Cpap, but does not use    PTSD (post-traumatic stress disorder)     Tachycardia     pt stated resolved-not on meds anymore.     Social History     Socioeconomic History    Marital status: Single     Spouse name: None    Number of children: None    Years of education: None    Highest education level: None   Tobacco Use    Smoking status: Some Days     Types: Cigarettes, Cigars     Start date: 2000     Passive exposure: Current    Smokeless tobacco: Never   Vaping Use    Vaping Use: Never used   Substance and Sexual Activity    Alcohol use: Yes     Comment: rarely    Drug use: Not Currently     Types: Marijuana Sheran Fava)     Comment: Medical marijuana in the past, positive for Opioids 01/09/2022     Social Determinants of Health     Financial Resource  Strain: Low Risk  (02/20/2022)    Overall Financial Resource Strain (CARDIA)     Difficulty of Paying Living Expenses: Not hard at all   Food Insecurity: No Food Insecurity (02/20/2022)    Hunger Vital Sign     Worried About Running Out of Food in the Last Year: Never true     Ran Out of Food in the Last Year: Never true   Transportation Needs: Unknown (02/20/2022)    PRAPARE - Transportation     Lack of Transportation (Non-Medical): No   Physical Activity: Inactive (02/20/2022)    Exercise Vital Sign     Days of Exercise per Week: 0 days     Minutes of Exercise per Session: 0 min   Housing Stability: Unknown (02/20/2022)    Housing Stability Vital Sign     Unstable Housing in the Last Year: No     Past Surgical History:   Procedure Laterality Date    BACK SURGERY  2022    fx back -hardware (rods & screws)    CHOLECYSTECTOMY  2019    COLONOSCOPY  KNEE ARTHROPLASTY Bilateral 2019    with revisions    MASTECTOMY Right     due to gynecomastia     History reviewed. No pertinent family history.  Allergies   Allergen Reactions    Eletriptan Anaphylaxis, Shortness Of Breath and Swelling    Indomethacin Other (See Comments) and Nausea And Vomiting     No current facility-administered medications for this encounter.     Current Outpatient Medications   Medication Sig Dispense Refill    atorvastatin (LIPITOR) 20 MG tablet TAKE 1 TABLET BY MOUTH EVERY DAY 90 tablet 1    oxyCODONE (OXYCONTIN) 10 MG extended release tablet Take 1 tablet by mouth every 12 hours. Max Daily Amount: 20 mg (Patient not taking: Reported on 05/03/2022)      oxyCODONE-acetaminophen (PERCOCET) 10-325 MG per tablet TAKE 1 TAB BY MOUTH EVERY 4 HOURS AS NEEDED (Patient not taking: Reported on 05/03/2022)      tamsulosin (FLOMAX) 0.4 MG capsule Take 1 capsule by mouth nightly      QUEtiapine (SEROQUEL) 200 MG tablet Take 1 tablet by mouth nightly 90 tablet 1    topiramate (TOPAMAX) 25 MG tablet Take 1 tablet by mouth nightly (Patient not taking: Reported on  05/03/2022) 30 tablet 0    ALPRAZolam (XANAX) 1 MG tablet Take 1 tablet by mouth 3 times daily as needed.      clonazePAM (KLONOPIN) 1 MG tablet Take 2 tablets by mouth 2 times daily.      FLUoxetine (PROZAC) 10 MG capsule Take 8 capsules by mouth daily (Patient not taking: Reported on 05/03/2022)      sildenafil (REVATIO) 20 MG tablet Take 1 tablet by mouth as needed      dicyclomine (BENTYL) 20 MG tablet Take 1 tablet by mouth every 6 hours (Patient not taking: Reported on 05/03/2022)      ARIPiprazole (ABILIFY) 2 MG tablet Take 1 tablet by mouth daily      montelukast (SINGULAIR) 10 MG tablet Take 1 tablet by mouth nightly         ROS:  Denies chills, fever,night sweats,  bowel or bladder dysfunction, unexplained weight loss/weight gain, chest pain, sob or anxiety.    Physical Examination    Gen: Well developed, well nourished 57 y.o. male with pain inhibition in his left arm including ehl, ta, hams and quads bilaterally. No clonus, no hoffmans, no babinski. Right arm is benign.     Assessment and Plan    Due to the pt's persistent symptoms unrelieved by conservative measure Jesus Genera. is being admitted to undergo surgical intervention. The post-operative plan of care consists of physical therapy, home health and a 2 week f/u office visit.  The risks, benefits, complications and alternatives to surgery have been discussed in detail with the patient.  The patient understands and agrees to proceed.

## 2022-05-20 ENCOUNTER — Observation Stay
Admission: RE | Admit: 2022-05-20 | Discharge: 2022-05-21 | Disposition: A | Discharge status: Home Health | Payer: MEDICARE | Attending: Orthopaedic Surgery | Admitting: Orthopaedic Surgery

## 2022-05-20 ENCOUNTER — Ambulatory Visit: Admit: 2022-05-20 | Payer: MEDICARE | Primary: Family Medicine

## 2022-05-20 DIAGNOSIS — M4722 Other spondylosis with radiculopathy, cervical region: Secondary | ICD-10-CM

## 2022-05-20 LAB — URINE DRUG SCREEN
Amphetamine, Urine: NEGATIVE
Barbiturates, Urine: NEGATIVE
Benzodiazepines, Urine: NEGATIVE
Cocaine, Urine: NEGATIVE
Methadone, Urine: NEGATIVE
Opiates, Urine: NEGATIVE
PCP, Urine: NEGATIVE
THC, TH-Cannabinol, Urine: POSITIVE — AB

## 2022-05-20 MED ORDER — LIDOCAINE HCL (PF) 1 % IJ SOLN
1 | Freq: Once | INTRAMUSCULAR | Status: DC | PRN
Start: 2022-05-20 — End: 2022-05-20

## 2022-05-20 MED ORDER — DEXAMETHASONE SODIUM PHOSPHATE 4 MG/ML IJ SOLN
4 | INTRAMUSCULAR | Status: AC
Start: 2022-05-20 — End: ?

## 2022-05-20 MED ORDER — THROMBIN 5000 UNITS EX SOLR
5000 | CUTANEOUS | Status: AC
Start: 2022-05-20 — End: ?

## 2022-05-20 MED ORDER — ROCURONIUM BROMIDE 50 MG/5ML IV SOLN
50 MG/5ML | INTRAVENOUS | Status: DC | PRN
  Administered 2022-05-20: 12:00:00 10 via INTRAVENOUS

## 2022-05-20 MED ORDER — MIDAZOLAM HCL 2 MG/2ML IJ SOLN
2 MG/2ML | INTRAMUSCULAR | Status: DC | PRN
  Administered 2022-05-20: 12:00:00 2 via INTRAVENOUS

## 2022-05-20 MED ORDER — LIDOCAINE HCL (PF) 2 % IJ SOLN
2 | INTRAMUSCULAR | Status: AC
Start: 2022-05-20 — End: ?

## 2022-05-20 MED ORDER — PROCHLORPERAZINE EDISYLATE 10 MG/2ML IJ SOLN
10 MG/2ML | INTRAMUSCULAR | Status: AC | PRN
  Administered 2022-05-20: 15:00:00 5 mg via INTRAVENOUS

## 2022-05-20 MED ORDER — PREGABALIN 75 MG PO CAPS
75 | Freq: Once | ORAL | Status: AC
Start: 2022-05-20 — End: 2022-05-20
  Administered 2022-05-20: 11:00:00 75 mg via ORAL

## 2022-05-20 MED ORDER — ACETAMINOPHEN 325 MG PO TABS
325 | Freq: Four times a day (QID) | ORAL | Status: DC
Start: 2022-05-20 — End: 2022-05-21
  Administered 2022-05-20 – 2022-05-21 (×4): 650 mg via ORAL

## 2022-05-20 MED ORDER — DIPHENHYDRAMINE HCL 50 MG/ML IJ SOLN
50 | INTRAMUSCULAR | Status: AC
Start: 2022-05-20 — End: 2022-05-20
  Administered 2022-05-20: 15:00:00 12.5 via INTRAVENOUS

## 2022-05-20 MED ORDER — FENTANYL CITRATE (PF) 100 MCG/2ML IJ SOLN
100 | INTRAMUSCULAR | Status: AC
Start: 2022-05-20 — End: ?

## 2022-05-20 MED ORDER — NORMAL SALINE FLUSH 0.9 % IV SOLN
0.9 | Freq: Two times a day (BID) | INTRAVENOUS | Status: DC
Start: 2022-05-20 — End: 2022-05-21
  Administered 2022-05-21 (×2): 10 mL via INTRAVENOUS

## 2022-05-20 MED ORDER — OXYCODONE HCL 5 MG PO TABS
5 MG | ORAL | Status: DC | PRN
Start: 2022-05-20 — End: 2022-05-21
  Administered 2022-05-20: 17:00:00 5 mg via ORAL

## 2022-05-20 MED ORDER — TAMSULOSIN HCL 0.4 MG PO CAPS
0.4 | Freq: Once | ORAL | Status: AC
Start: 2022-05-20 — End: 2022-05-20
  Administered 2022-05-20: 11:00:00 0.4 mg via ORAL

## 2022-05-20 MED ORDER — METHYLENE BLUE (ANTIDOTE) 50 MG/10ML IV SOLN
50 | INTRAVENOUS | Status: DC | PRN
Start: 2022-05-20 — End: 2022-05-20
  Administered 2022-05-20: 13:00:00 1 via INTRAVENOUS

## 2022-05-20 MED ORDER — METAXALONE 800 MG PO TABS
800 | Freq: Three times a day (TID) | ORAL | Status: DC | PRN
Start: 2022-05-20 — End: 2022-05-21

## 2022-05-20 MED ORDER — DIPHENHYDRAMINE HCL 50 MG/ML IJ SOLN
50 | Freq: Once | INTRAMUSCULAR | Status: AC
Start: 2022-05-20 — End: 2022-05-20

## 2022-05-20 MED ORDER — VASOPRESSIN 20 UNIT/ML IV SOLN
20 UNIT/ML | INTRAVENOUS | Status: DC | PRN
  Administered 2022-05-20 (×2): 2 via INTRAVENOUS

## 2022-05-20 MED ORDER — DEXAMETHASONE SODIUM PHOSPHATE 4 MG/ML IJ SOLN
4 MG/ML | INTRAMUSCULAR | Status: DC | PRN
  Administered 2022-05-20: 12:00:00 8 via INTRAVENOUS

## 2022-05-20 MED ORDER — VANCOMYCIN HCL 1 G IV SOLR
1 | INTRAVENOUS | Status: AC
Start: 2022-05-20 — End: ?

## 2022-05-20 MED ORDER — DIPHENHYDRAMINE HCL 25 MG PO CAPS
25 | Freq: Four times a day (QID) | ORAL | Status: DC | PRN
Start: 2022-05-20 — End: 2022-05-21

## 2022-05-20 MED ORDER — STERILE WATER FOR INJECTION (MIXTURES ONLY)
1 | Freq: Once | INTRAMUSCULAR | Status: AC
Start: 2022-05-20 — End: 2022-05-20
  Administered 2022-05-20: 12:00:00 2000 mg via INTRAVENOUS

## 2022-05-20 MED ORDER — ACETAMINOPHEN 500 MG PO TABS
500 | Freq: Once | ORAL | Status: AC
Start: 2022-05-20 — End: 2022-05-20
  Administered 2022-05-20: 11:00:00 1000 mg via ORAL

## 2022-05-20 MED ORDER — PHENYLEPHRINE HCL 1 MG/10ML IV SOSY
1 MG/10ML | INTRAVENOUS | Status: DC | PRN
  Administered 2022-05-20 (×2): 100 via INTRAVENOUS
  Administered 2022-05-20: 13:00:00 200 via INTRAVENOUS
  Administered 2022-05-20 (×4): 100 via INTRAVENOUS

## 2022-05-20 MED ORDER — HYDROMORPHONE HCL PF 1 MG/ML IJ SOLN
1 | INTRAMUSCULAR | Status: DC | PRN
Start: 2022-05-20 — End: 2022-05-21

## 2022-05-20 MED ORDER — SUCCINYLCHOLINE CHLORIDE 100 MG/5ML IV SOSY
100 | INTRAVENOUS | Status: AC
Start: 2022-05-20 — End: ?

## 2022-05-20 MED ORDER — ONDANSETRON HCL 4 MG/2ML IJ SOLN
4 MG/2ML | INTRAMUSCULAR | Status: DC | PRN
  Administered 2022-05-20: 12:00:00 4 via INTRAVENOUS

## 2022-05-20 MED ORDER — THROMBIN (RECOMBINANT) 5000 UNITS EX SOLR
5000 | CUTANEOUS | Status: DC | PRN
Start: 2022-05-20 — End: 2022-05-20
  Administered 2022-05-20: 12:00:00 10000 via TOPICAL

## 2022-05-20 MED ORDER — OXYCODONE HCL 5 MG PO TABS
5 | ORAL | Status: DC | PRN
Start: 2022-05-20 — End: 2022-05-21
  Administered 2022-05-20 – 2022-05-21 (×3): 10 mg via ORAL

## 2022-05-20 MED ORDER — ROCURONIUM BROMIDE 50 MG/5ML IV SOLN
50 | INTRAVENOUS | Status: AC
Start: 2022-05-20 — End: ?

## 2022-05-20 MED ORDER — VANCOMYCIN HCL 1 G IV SOLR
1 | INTRAVENOUS | Status: DC | PRN
Start: 2022-05-20 — End: 2022-05-20
  Administered 2022-05-20: 13:00:00 1000 via TOPICAL

## 2022-05-20 MED ORDER — KETOROLAC TROMETHAMINE 15 MG/ML IJ SOLN
15 | INTRAMUSCULAR | Status: AC
Start: 2022-05-20 — End: ?

## 2022-05-20 MED ORDER — PROPOFOL 200 MG/20ML IV EMUL
200 MG/20ML | INTRAVENOUS | Status: DC | PRN
  Administered 2022-05-20: 12:00:00 200 via INTRAVENOUS

## 2022-05-20 MED ORDER — MIDAZOLAM HCL 2 MG/2ML IJ SOLN
2 | INTRAMUSCULAR | Status: AC
Start: 2022-05-20 — End: ?

## 2022-05-20 MED ORDER — KETOROLAC TROMETHAMINE 15 MG/ML IJ SOLN
15 MG/ML | INTRAMUSCULAR | Status: DC | PRN
  Administered 2022-05-20: 13:00:00 30 via INTRAVENOUS

## 2022-05-20 MED ORDER — ACETAMINOPHEN 325 MG PO TABS: 325 MG | ORAL | Status: DC | PRN

## 2022-05-20 MED ORDER — ONDANSETRON HCL 4 MG/2ML IJ SOLN
4 | INTRAMUSCULAR | Status: AC
Start: 2022-05-20 — End: ?

## 2022-05-20 MED ORDER — PROPOFOL 200 MG/20ML IV EMUL
200 | INTRAVENOUS | Status: AC
Start: 2022-05-20 — End: ?

## 2022-05-20 MED ORDER — TAMSULOSIN HCL 0.4 MG PO CAPS
0.4 MG | Freq: Every evening | ORAL | Status: DC
Start: 2022-05-20 — End: 2022-05-21
  Administered 2022-05-21: 02:00:00 0.4 mg via ORAL

## 2022-05-20 MED ORDER — TRAMADOL HCL 50 MG PO TABS: 50 MG | ORAL | Status: DC | PRN

## 2022-05-20 MED ORDER — FAMOTIDINE 20 MG PO TABS
20 | Freq: Once | ORAL | Status: AC
Start: 2022-05-20 — End: 2022-05-20
  Administered 2022-05-20: 11:00:00 20 mg via ORAL

## 2022-05-20 MED ORDER — MEPERIDINE HCL 25 MG/ML IJ SOLN: 25 MG/ML | INTRAMUSCULAR | Status: DC | PRN

## 2022-05-20 MED ORDER — LIDOCAINE HCL (PF) 2 % IJ SOLN
2 % | INTRAMUSCULAR | Status: DC | PRN
  Administered 2022-05-20: 12:00:00 100 via INTRAVENOUS

## 2022-05-20 MED ORDER — SODIUM CHLORIDE 0.45 % IV SOLN
0.45 | INTRAVENOUS | Status: DC
Start: 2022-05-20 — End: 2022-05-21
  Administered 2022-05-20: 17:00:00 via INTRAVENOUS

## 2022-05-20 MED ORDER — ONDANSETRON 4 MG PO TBDP
4 | Freq: Three times a day (TID) | ORAL | Status: DC | PRN
Start: 2022-05-20 — End: 2022-05-21

## 2022-05-20 MED ORDER — METOCLOPRAMIDE HCL 5 MG/ML IJ SOLN: 5 MG/ML | INTRAMUSCULAR | Status: DC | PRN

## 2022-05-20 MED ORDER — SUCCINYLCHOLINE CHLORIDE 100 MG/5ML IV SOSY
100 MG/5ML | INTRAVENOUS | Status: DC | PRN
  Administered 2022-05-20: 12:00:00 100 via INTRAVENOUS

## 2022-05-20 MED ORDER — SODIUM CHLORIDE 0.9 % IV SOLN (MINI-BAG)
0.9 % | Freq: Once | INTRAVENOUS | Status: AC
Start: 2022-05-20 — End: 2022-05-20
  Administered 2022-05-20 (×2): 1000 mg via INTRAVENOUS

## 2022-05-20 MED ORDER — GELATIN ABSORBABLE 100 CM EX MISC
100 | CUTANEOUS | Status: AC
Start: 2022-05-20 — End: ?

## 2022-05-20 MED ORDER — SODIUM CHLORIDE (PF) 0.9 % IJ SOLN: 0.9 % | INTRAMUSCULAR | Status: DC | PRN

## 2022-05-20 MED ORDER — HYDROMORPHONE 0.5MG/0.5ML IJ SOLN
1 MG/ML | Status: AC | PRN
  Administered 2022-05-20 (×2): 0.5 mg via INTRAVENOUS

## 2022-05-20 MED ORDER — DIPHENHYDRAMINE HCL 50 MG/ML IJ SOLN
50 | Freq: Four times a day (QID) | INTRAMUSCULAR | Status: DC | PRN
Start: 2022-05-20 — End: 2022-05-21

## 2022-05-20 MED ORDER — POLYETHYLENE GLYCOL 3350 17 G PO PACK
17 g | Freq: Every day | ORAL | Status: DC
Start: 2022-05-20 — End: 2022-05-21
  Administered 2022-05-20 – 2022-05-21 (×2): 17 g via ORAL

## 2022-05-20 MED ORDER — FAMOTIDINE 20 MG PO TABS
20 | Freq: Two times a day (BID) | ORAL | Status: DC
Start: 2022-05-20 — End: 2022-05-21
  Administered 2022-05-20 – 2022-05-21 (×3): 20 mg via ORAL

## 2022-05-20 MED ORDER — FENTANYL CITRATE (PF) 100 MCG/2ML IJ SOLN
100 MCG/2ML | INTRAMUSCULAR | Status: DC | PRN
  Administered 2022-05-20 (×2): 50 via INTRAVENOUS

## 2022-05-20 MED ORDER — METHYLENE BLUE (ANTIDOTE) 50 MG/10ML IV SOLN
50 | INTRAVENOUS | Status: AC
Start: 2022-05-20 — End: ?

## 2022-05-20 MED ORDER — ARIPIPRAZOLE 2 MG PO TABS
2 | Freq: Every day | ORAL | Status: DC
Start: 2022-05-20 — End: 2022-05-21
  Administered 2022-05-21: 13:00:00 2 mg via ORAL

## 2022-05-20 MED ORDER — ALPRAZOLAM 0.5 MG PO TABS
0.5 | Freq: Three times a day (TID) | ORAL | Status: DC | PRN
Start: 2022-05-20 — End: 2022-05-21

## 2022-05-20 MED ORDER — LACTATED RINGERS IV SOLN
INTRAVENOUS | Status: DC
  Administered 2022-05-20: 15:00:00 via INTRAVENOUS

## 2022-05-20 MED ORDER — SODIUM CHLORIDE (PF) 0.9 % IJ SOLN
0.9 % | Freq: Two times a day (BID) | INTRAMUSCULAR | Status: DC
Start: 2022-05-20 — End: 2022-05-21

## 2022-05-20 MED ORDER — STERILE WATER FOR INJECTION (MIXTURES ONLY)
1 g | Freq: Three times a day (TID) | INTRAMUSCULAR | Status: AC
Start: 2022-05-20 — End: 2022-05-21
  Administered 2022-05-20 – 2022-05-21 (×2): 2000 mg via INTRAVENOUS

## 2022-05-20 MED ORDER — GELATIN ABSORBABLE 100 EX MISC
100 | CUTANEOUS | Status: DC | PRN
Start: 2022-05-20 — End: 2022-05-20
  Administered 2022-05-20: 13:00:00 1 via TOPICAL

## 2022-05-20 MED ORDER — FENTANYL CITRATE (PF) 100 MCG/2ML IJ SOLN
100 MCG/2ML | INTRAMUSCULAR | Status: DC | PRN
  Administered 2022-05-20: 15:00:00 25 ug via INTRAVENOUS

## 2022-05-20 MED ORDER — MONTELUKAST SODIUM 10 MG PO TABS
10 | Freq: Every evening | ORAL | Status: DC
Start: 2022-05-20 — End: 2022-05-21
  Administered 2022-05-21: 02:00:00 10 mg via ORAL

## 2022-05-20 MED ORDER — LACTATED RINGERS IV SOLN
INTRAVENOUS | Status: DC
Start: 2022-05-20 — End: 2022-05-20
  Administered 2022-05-20: 11:00:00 via INTRAVENOUS

## 2022-05-20 MED ORDER — QUETIAPINE FUMARATE 100 MG PO TABS
100 | Freq: Every evening | ORAL | Status: DC
Start: 2022-05-20 — End: 2022-05-21
  Administered 2022-05-21: 02:00:00 200 mg via ORAL

## 2022-05-20 MED ORDER — SODIUM CHLORIDE 0.9 % IV SOLN (MINI-BAG)
0.9 % | Freq: Once | INTRAVENOUS | Status: DC
Start: 2022-05-20 — End: 2022-05-21

## 2022-05-20 MED ORDER — BISACODYL 5 MG PO TBEC
5 | Freq: Every day | ORAL | Status: DC
Start: 2022-05-20 — End: 2022-05-21
  Administered 2022-05-20 – 2022-05-21 (×2): 5 mg via ORAL

## 2022-05-20 MED ORDER — OXYCODONE HCL 5 MG PO TABS
5 | ORAL_TABLET | Freq: Four times a day (QID) | ORAL | 0 refills | Status: AC | PRN
Start: 2022-05-20 — End: 2022-05-27

## 2022-05-20 MED ORDER — ONDANSETRON HCL 4 MG/2ML IJ SOLN
4 | Freq: Four times a day (QID) | INTRAMUSCULAR | Status: DC | PRN
Start: 2022-05-20 — End: 2022-05-21

## 2022-05-20 MED ORDER — PHENYLEPHRINE HCL 1 MG/10ML IV SOSY
1 | INTRAVENOUS | Status: AC
Start: 2022-05-20 — End: ?

## 2022-05-20 MED ORDER — VASOPRESSIN 20 UNIT/ML IV SOLN
20 | INTRAVENOUS | Status: AC
Start: 2022-05-20 — End: ?

## 2022-05-20 MED FILL — MIDAZOLAM HCL 2 MG/2ML IJ SOLN: 2 MG/ML | INTRAMUSCULAR | Qty: 2

## 2022-05-20 MED FILL — ARIPIPRAZOLE 2 MG PO TABS: 2 MG | ORAL | Qty: 1

## 2022-05-20 MED FILL — FAMOTIDINE 20 MG PO TABS: 20 MG | ORAL | Qty: 1

## 2022-05-20 MED FILL — KETOROLAC TROMETHAMINE 15 MG/ML IJ SOLN: 15 MG/ML | INTRAMUSCULAR | Qty: 1

## 2022-05-20 MED FILL — LACTATED RINGERS IV SOLN: INTRAVENOUS | Qty: 1000

## 2022-05-20 MED FILL — BISACODYL EC 5 MG PO TBEC: 5 MG | ORAL | Qty: 1

## 2022-05-20 MED FILL — POLYETHYLENE GLYCOL 3350 17 G PO PACK: 17 g | ORAL | Qty: 1

## 2022-05-20 MED FILL — ACETAMINOPHEN 325 MG PO TABS: 325 MG | ORAL | Qty: 2

## 2022-05-20 MED FILL — PREGABALIN 75 MG PO CAPS: 75 MG | ORAL | Qty: 1

## 2022-05-20 MED FILL — OXYCODONE HCL 5 MG PO TABS: 5 MG | ORAL | Qty: 1

## 2022-05-20 MED FILL — DEXAMETHASONE SODIUM PHOSPHATE 4 MG/ML IJ SOLN: 4 MG/ML | INTRAMUSCULAR | Qty: 1

## 2022-05-20 MED FILL — CEFAZOLIN SODIUM 1 G IJ SOLR: 1 g | INTRAMUSCULAR | Qty: 2000

## 2022-05-20 MED FILL — THROMBIN-JMI 5000 UNITS EX SOLR: 5000 units | CUTANEOUS | Qty: 10000

## 2022-05-20 MED FILL — ONDANSETRON HCL 4 MG/2ML IJ SOLN: 4 MG/2ML | INTRAMUSCULAR | Qty: 2

## 2022-05-20 MED FILL — DILAUDID 1 MG/ML IJ SOLN: 1 MG/ML | INTRAMUSCULAR | Qty: 0.5

## 2022-05-20 MED FILL — FENTANYL CITRATE (PF) 100 MCG/2ML IJ SOLN: 100 MCG/2ML | INTRAMUSCULAR | Qty: 2

## 2022-05-20 MED FILL — FAMOTIDINE (PF) 20 MG/2ML IV SOLN: 20 MG/2ML | INTRAVENOUS | Qty: 2

## 2022-05-20 MED FILL — TRANEXAMIC ACID 1000 MG/10ML IV SOLN: 1000 MG/10ML | INTRAVENOUS | Qty: 10

## 2022-05-20 MED FILL — VASOSTRICT 20 UNIT/ML IV SOLN: 20 UNIT/ML | INTRAVENOUS | Qty: 1

## 2022-05-20 MED FILL — SUCCINYLCHOLINE CHLORIDE 100 MG/5ML IV SOSY: 100 MG/5ML | INTRAVENOUS | Qty: 5

## 2022-05-20 MED FILL — GELFOAM COMPRESSED SIZE 100 EX MISC: CUTANEOUS | Qty: 6

## 2022-05-20 MED FILL — BD POSIFLUSH 0.9 % IV SOLN: 0.9 % | INTRAVENOUS | Qty: 40

## 2022-05-20 MED FILL — VANCOMYCIN HCL 1 G IV SOLR: 1 g | INTRAVENOUS | Qty: 1000

## 2022-05-20 MED FILL — ROCURONIUM BROMIDE 50 MG/5ML IV SOLN: 50 MG/5ML | INTRAVENOUS | Qty: 5

## 2022-05-20 MED FILL — PHENYLEPHRINE HCL (PRESSORS) 1 MG/10ML IV SOSY: 1 MG/0ML | INTRAVENOUS | Qty: 10

## 2022-05-20 MED FILL — TAMSULOSIN HCL 0.4 MG PO CAPS: 0.4 MG | ORAL | Qty: 1

## 2022-05-20 MED FILL — PROCHLORPERAZINE EDISYLATE 10 MG/2ML IJ SOLN: 10 MG/2ML | INTRAMUSCULAR | Qty: 2

## 2022-05-20 MED FILL — SODIUM CHLORIDE 0.45 % IV SOLN: 0.45 % | INTRAVENOUS | Qty: 1000

## 2022-05-20 MED FILL — DIPHENHYDRAMINE HCL 50 MG/ML IJ SOLN: 50 MG/ML | INTRAMUSCULAR | Qty: 1

## 2022-05-20 MED FILL — PROPOFOL 200 MG/20ML IV EMUL: 200 MG/20ML | INTRAVENOUS | Qty: 20

## 2022-05-20 MED FILL — ACETAMINOPHEN EXTRA STRENGTH 500 MG PO TABS: 500 MG | ORAL | Qty: 2

## 2022-05-20 MED FILL — XYLOCAINE-MPF 2 % IJ SOLN: 2 % | INTRAMUSCULAR | Qty: 5

## 2022-05-20 MED FILL — PROVAYBLUE 50 MG/10ML IV SOLN: 50 MG/10ML | INTRAVENOUS | Qty: 10

## 2022-05-20 MED FILL — OXYCODONE HCL 5 MG PO TABS: 5 MG | ORAL | Qty: 2

## 2022-05-20 NOTE — Other (Signed)
TRANSFER - OUT REPORT:    Verbal report given to Harlow Ohms, RN on Brandon Montoya.  being transferred to 2 Surgical for routine progression of patient care       Report consisted of patient's Situation, Background, Assessment and   Recommendations(SBAR).     Information from the following report(s) Nurse Handoff Report, Surgery Report, Intake/Output, MAR, and Cardiac Rhythm sinus rhythm  was reviewed with the receiving nurse.           Lines:   Peripheral IV 05/20/22 Left;Posterior Hand (Active)   Site Assessment Clean, dry & intact 05/20/22 0655   Line Care Connections checked and tightened 05/20/22 0655   Phlebitis Assessment No symptoms 05/20/22 0655   Infiltration Assessment 0 05/20/22 0655   Dressing Status New dressing applied 05/20/22 0655   Dressing Type Transparent 05/20/22 0655   Dressing Intervention New 05/20/22 0655       Peripheral IV 05/20/22 Right Antecubital (Active)        Opportunity for questions and clarification was provided.      Patient transported with:  O2 @ 2lpm and Tech

## 2022-05-20 NOTE — Progress Notes (Signed)
Received patient from PACU after undergoing an ACDF surgery. Upon arrival patient was Aox4, vital signs were stable, and patient did complain about pain in the neck that was treated.     Prior to surgery patient experienced symptoms of numbness and tingling that radiated from his left arm to his fingers. He also experience some pain in his left shoulder and arm. As of right now patient is no longer experiencing any prior symptoms before surgery.

## 2022-05-20 NOTE — Other (Signed)
Patient /Family /Designee has been informed that [facility] is not responsible for patient belongings per policy and the signed [facility] Patient Agreement document.  Personal items should be sent home or checked in with security.  Patient /Family /Designee selected the following action:                            [x]  Send personal items home with a family member or friend                                                 []  Check in personal items with security, excluding clothing                            []  Maintain personal items at the bedside, against recommendation                                 by  [facility]                                   ** If patient /family /designee chooses to maintain personal items at the bedside,                                      Complete the patient belongings inventory in the EMR.

## 2022-05-20 NOTE — Discharge Instructions (Addendum)
Dr. Herschell Dimes Post-Operative Instructions: Cervical Surgery    DO NOT take any NSAIDS if you had Fusion Surgery.     ACTIVITIES:  Change positions every hour while you are awake.  Walking is the best way to rebuild strength.   Wear your soft collar just for comfort if it is provided to you.  You may remove if desired.   Sleep with your head elevated for 2-3 nights after surgery to prevent swelling.   Avoid strenuous activity, such as yard work, vacuuming, or lifting anything heavier than a gallon of milk.   Avoid strenuous activities, such as vacuuming, and do not lift anything heavier than 1 gallon of milk (or about 5-8 pounds).   Walk at a pace that avoids fatigue or severe pain. Do not try to walk several blocks the first day!   Follow-up with Dr. Carney Living will be 7-10 days from surgery.    BATHING and INCISION CARE:  The incision may be tender or feel numb: this is normal.   Keep the incision clean and dry. You may shower 3 days after surgery. Cover the dressing with saran wrap before getting in the shower. The incision is closed with sutures under the skin and glue on top.   Do not apply any lotions, ointments or oils on the incision.   Do not remove the dressing. Your dressing will be changed at your first post op appointment. If it comes loose or is damaged, dirty or wet before this appointment, call your home health nurse (if you are being seen by a nurse at home) or the office to have the dressing changed.  If you notice any excessive swelling, redness, or persistent drainage around the incision, notify the office immediately.    CONSTIPATION:  Take a stool softener twice a day while you are taking a narcotic.   If you have not had a bowel movement within 3 days of surgery, you will need to use a laxative or suppository that can be obtained over the counter at your local pharmacy     ICE  Use ice on your neck and shoulders to decrease pain and swelling.  Do NOT use heat.    MEDICATIONS:  If you had  fusion surgery DO NOT TAKE non-steroidal anti-inflammatory (NSAID) medications, such as Motrin, Aleve, Advil Naprosyn, Ibuprofen or aspirin.   Take Tylenol/Acetaminophen every 4-6 hours for pain. Do not take more than 3000 mg each day. (Do not take Tylenol/Acetaminophen if you have liver problems).  Take your prescribed narcotic pain medication as needed for pain that is not tolerable.    Eat food before you take any pain medication to avoid nausea.    If you need a medication refill, please call the office during working hours at least 2 days before your prescription runs out. Do not wait until your bottle is empty to call for a refill.           EATING & NUTRITION:  You can eat anything you can chew and make soft.  Take small bites and avoid tough foods like meat and bread.    Painful swallowing is normal after surgery and may be experienced up to 2 weeks post-operatively.   You may obtain over the counter Chloraseptic spray.  The more you swallow the better your throat will feel.     If at any point you can no longer swallow your own saliva, call Dr. Herschell Dimes office right away.    Eat a healthy balanced diet to  help your wound to heal. Protein supplements should be considered if you are eating less than 50% of your meal.   Drink plenty of water to stay hydrated.    DRIVING & RETURN TO WORK:   You will be told at your follow up appointment if it is safe for you to drive or return to work and will be provided with a return-to-work-note if needed (please ask).   NEVER drive while taking narcotic medication.    WHEN TO CALL THE OFFICE:  If you have severe pain unrelieved by the medications, new numbness or tingling in your legs;       If you have a fever of 101.61F or greater   If you notice increased swelling, redness, or increased drainage from the incision    If you are not able to urinate  If you are not able to control your bowels   If you are unable to swallow your own saliva      [facility]  number is [phone].  They are open from 8:00am to 5:00pm Mon - Fri. After 5:00pm, or on weekends/holidays, please call the answering service at [phone] for a call back.  DISCHARGE SUMMARY from Nurse    PATIENT INSTRUCTIONS:    After general anesthesia or intravenous sedation, for 24 hours or while taking prescription Narcotics:  Limit your activities  Do not drive and operate hazardous machinery  Do not make important personal or business decisions  Do  not drink alcoholic beverages  If you have not urinated within 8 hours after discharge, please contact your surgeon on call.    Report the following to your surgeon:  Excessive pain, swelling, redness or odor of or around the surgical area  Temperature over 100.5  Nausea and vomiting lasting longer than 4 hours or if unable to take medications  Any signs of decreased circulation or nerve impairment to extremity: change in color, persistent  numbness, tingling, coldness or increase pain  Any questions    What to do at Home:  Recommended activity: activity as tolerated,     If you experience any of the following symptoms Follow discharge instructions, please follow up with Dr. Carney Living.    *  Please give a list of your current medications to your Primary Care Provider.    *  Please update this list whenever your medications are discontinued, doses are      changed, or new medications (including over-the-counter products) are added.    *  Please carry medication information at all times in case of emergency situations.    These are general instructions for a healthy lifestyle:    No smoking/ No tobacco products/ Avoid exposure to second hand smoke  Surgeon General's Warning:  Quitting smoking now greatly reduces serious risk to your health.    Obesity, smoking, and sedentary lifestyle greatly increases your risk for illness    A healthy diet, regular physical exercise & weight monitoring are important for maintaining a healthy lifestyle    You may be retaining  fluid if you have a history of heart failure or if you experience any of the following symptoms:  Weight gain of 3 pounds or more overnight or 5 pounds in a week, increased swelling in our hands or feet or shortness of breath while lying flat in bed.  Please call your doctor as soon as you notice any of these symptoms; do not wait until your next office visit.  Patient armband removed and shredded  Thank you for enrolling in MyChart. Please follow the instructions below to securely access your online medical record. MyChart allows you to send messages to your doctor, view your test results, renew your prescriptions, schedule appointments, and more.     How Do I Sign Up?  In your Internet browser, go to [email or web address]  Click on the Sign Up Now link in the Sign In box. You will see the New Member Sign Up page.  Enter your MyChart Access Code exactly as it appears below. You will not need to use this code after you've completed the sign-up process. If you do not sign up before the expiration date, you must request a new code.  MyChart Access Code: Activation code not generated  Current MyChart Status: Active    Enter your Social Security Number ([id number]) and Date of Birth (mm/dd/yyyy) as indicated and click Submit. You will be taken to the next sign-up page.  Create a MyChart ID. This will be your MyChart login ID and cannot be changed, so think of one that is secure and easy to remember.  Create a Clinical biochemist. You can change your password at any time.  Enter your Password Reset Question and Answer. This can be used at a later time if you forget your password.   Enter your e-mail address. You will receive e-mail notification when new information is available in MyChart.  Click Sign Up. You can now view your medical record.     Additional Information  If you have questions, please contact your physician practice where you receive care. Remember, MyChart is NOT to be used for  urgent needs. For medical emergencies, dial 911.     The discharge information has been reviewed with the {PATIENT PARENT GUARDIAN:40233}.  The {PATIENT PARENT GUARDIAN:40233} verbalized understanding.  Discharge medications reviewed with the {Dishcarge meds reviewed WUJW:11914} and appropriate educational materials and side effects teaching were provided.  ___________________________________________________________________________________________________________________________________

## 2022-05-20 NOTE — Progress Notes (Signed)
Physical Therapy  PHYSICAL THERAPY EVALUATION/DISCHARGE    Patient: Brandon Montoya. (57 y.o. male)  Date: 05/20/2022  Primary Diagnosis: Cervical spinal stenosis [M48.02]  Cervical radiculopathy due to osteoarthritis of spine [M47.22]  Procedure(s) (LRB):  ANTERIOR CERVICAL DECOMPRESSION FUSION CERVICAL SIX/SEVEN, CERVICAL SEVEN/THORACIC ONE; C-ARM; [STRYKER SPINE] (N/A) Day of Surgery   Precautions: Surgical Protocols, General Precautions,  ,  ,  ,  , Spinal Precautions: No Bending, No Lifting, No Twisting,  ,    PLOF: Lives with partner in 1 story home with 3 STE and L railing. Mod I with SPC prior to admission.     ASSESSMENT AND RECOMMENDATIONS:  Patient is a 57 yo male admitted to hospital for ACDF and presents today POD 0 and agreeable to therapy. Patient was educated on cervical precautions and demonstrated good compliance with precautions throughout session. Patient was given demo with instruction on sit <> stand transfer and gait training and transferred to standing with mod I into RW and ambulated 250 mod I with RW, minimal gait deviations noted.  Pt negotiates stairs with reciprocal pattern and no railing use. At conclusion of session patient transferred to sitting in recliner and was left resting with call bell by the side. Patient instructed to call for assistance if they needed to get up for any reason and denied need for further assistance. Patient demonstrates decreased strength, mobility, and endurance and will benefit from home health PT upon d/c. Pt is cleared from an acute care PT standpoint.      Patient does not require further skilled physical therapy intervention at this level of care.    Further Equipment Recommendations for Discharge:  owns appropriate DME    AMPAC: AM-PAC Inpatient Mobility Raw Score : 23         Current research shows that an AM-PAC score of 18 (14 without stairs) or greater is associated with a discharge to the patient's home setting. Based on an AM-PAC score and their  current functional mobility deficits, it is recommended that the patient have 2-3 sessions per week of Physical Therapy at d/c to increase the patient's independence.    This AMPAC score should be considered in conjunction with interdisciplinary team recommendations to determine the most appropriate discharge setting. Patient's social support, diagnosis, medical stability, and prior level of function should also be taken into consideration.     SUBJECTIVE:   Patient stated "I have more surgeries than tattoos."    OBJECTIVE DATA SUMMARY:     Past Medical History:   Diagnosis Date    Arthritis     Bipolar 1 disorder (HCC)     Chronic pain     History of blood transfusion     Marijuana use     positive for Opioids 01/09/2022    OSA (obstructive sleep apnea)     pt has Cpap, but does not use    PTSD (post-traumatic stress disorder)     Tachycardia     pt stated resolved-not on meds anymore.     Past Surgical History:   Procedure Laterality Date    BACK SURGERY  2022    fx back -hardware (rods & screws)    CHOLECYSTECTOMY  2019    COLONOSCOPY      KNEE ARTHROPLASTY Bilateral 2019    with revisions    MASTECTOMY Right     due to gynecomastia       Home Situation:  Social/Functional History  Lives With: Significant other  Type of Home: House  Home Layout: One level  Home Access: Stairs to enter with rails  Entrance Stairs - Number of Steps: 3  Entrance Stairs - Rails: Left  Home Equipment: Gilmer Mor  Has the patient had two or more falls in the past year or any fall with injury in the past year?: No  Ambulation Assistance: Independent  Transfer Assistance: Independent  Critical Behavior:  Orientation  Overall Orientation Status: Within Normal Limits  Orientation Level: Oriented X4  Cognition  Overall Cognitive Status: WNL    Strength:    Strength: Within functional limits    Tone & Sensation:   Tone: Normal  Sensation: Intact    Range Of Motion:  AROM: Generally decreased, functional  PROM: Generally decreased,  functional    Functional Mobility:  Bed Mobility:     Bed Mobility Training  Bed Mobility Training: Yes  Rolling: Modified independent  Supine to Sit: Modified independent  Scooting: Modified independent  Transfers:     Art therapist: Yes  Sit to Stand: Modified independent  Stand to Sit: Modified independent  Balance:               Balance  Sitting: Intact  Standing: Intact          Ambulation/Gait Training:                       Gait  Gait Training: Yes  Overall Level of Assistance: Modified independent  Distance (ft): 250 Feet  Assistive Device: Walker, rolling  Interventions: Safety awareness training  Rail Use: None  Stairs - Level of Assistance: Modified independent  Number of Stairs Trained: 4                     Pain:  Intensity Pre-treatment: 6/10   Intensity Post-treatment: 6/10  Scale: Numeric Rating Scale  Location: Neck  Quality: Aching and Burning  Intervention(s): Nurse notified, Repositioning , and Rest      Activity Tolerance:   Activity Tolerance: Patient tolerated evaluation without incident  Please refer to the flowsheet for vital signs taken during this treatment.    After treatment:   [x]          Patient left in no apparent distress sitting up in chair  []          Patient left in no apparent distress in bed  [x]          Call bell left within reach  [x]          Nursing notified  []          Caregiver present  []          Bed alarm activated  []          SCDs applied    COMMUNICATION/EDUCATION:   Patient Education  Education Given To: Patient;Family  Education Provided: Role of Therapy;Plan of Adult nurse;Fall Prevention Strategies;Equipment  Education Method: Demonstration;Verbal;Teach Back  Barriers to Learning: None  Education Outcome: Verbalized understanding;Demonstrated understanding    Thank you for this referral.  Carollee Leitz, PT  Minutes: 18      Eval Complexity: Decision Making: Medium Complexity

## 2022-05-20 NOTE — Anesthesia Pre-Procedure Evaluation (Signed)
Department of Anesthesiology  Preprocedure Note       Name:  Brandon Montoya.   Age:  57 y.o.  DOB:  Feb 12, 1965                                          MRN:  [id number]         Date:  05/20/2022      Surgeon: Moishe Spice):  Pamalee Leyden, MD    Procedure: Procedure(s):  ANTERIOR CERVICAL DECOMPRESSION FUSION (ACDF) CERVICAL SIX/SEVEN, CERVICAL SEVEN/THORACIC ONE; C-ARM; [STRYKER SPINE]    Medications prior to admission:   Prior to Admission medications    Medication Sig Start Date End Date Taking? Authorizing Provider   oxyCODONE (ROXICODONE) 5 MG immediate release tablet Take 1 tablet by mouth every 6 hours as needed for Pain for up to 7 days. Intended supply: 7 days. Take lowest dose possible to manage pain Max Daily Amount: 20 mg 05/20/22 05/27/22 Yes Pamalee Leyden, MD   atorvastatin (LIPITOR) 20 MG tablet TAKE 1 TABLET BY MOUTH EVERY DAY 04/01/22   Mugaisi, Hesed N, MD   oxyCODONE (OXYCONTIN) 10 MG extended release tablet Take 1 tablet by mouth every 12 hours. Max Daily Amount: 20 mg 02/05/22   [provider]   tamsulosin (FLOMAX) 0.4 MG capsule Take 1 capsule by mouth nightly    [provider]   QUEtiapine (SEROQUEL) 200 MG tablet Take 1 tablet by mouth nightly 01/15/22   Mugaisi, Hesed N, MD   topiramate (TOPAMAX) 25 MG tablet Take 1 tablet by mouth nightly  Patient not taking: Reported on 05/03/2022 01/14/22   Bettina Gavia, MD   ALPRAZolam Prudy Feeler) 1 MG tablet Take 1 tablet by mouth 3 times daily as needed.    [provider]   clonazePAM (KLONOPIN) 1 MG tablet Take 2 tablets by mouth 2 times daily. 10/15/18   [provider]   FLUoxetine (PROZAC) 10 MG capsule Take 8 capsules by mouth daily  Patient not taking: Reported on 05/03/2022    [provider]   sildenafil (REVATIO) 20 MG tablet Take 1 tablet by mouth as needed  Patient not taking: Reported on 05/20/2022    [provider]   dicyclomine (BENTYL) 20 MG tablet Take 1 tablet by mouth every 6  hours  Patient not taking: Reported on 05/03/2022    [provider]   ARIPiprazole (ABILIFY) 2 MG tablet Take 1 tablet by mouth daily  Patient not taking: Reported on 05/20/2022    [provider]   montelukast (SINGULAIR) 10 MG tablet Take 1 tablet by mouth nightly    [provider]       Current medications:    Current Facility-Administered Medications   Medication Dose Route Frequency Provider Last Rate Last Admin   . lidocaine PF 1 % injection 1 mL  1 mL IntraDERmal Once PRN [place] Pink, APRN - CRNA       . lactated ringers IV soln infusion   IntraVENous Continuous [place] Pink, APRN - CRNA 125 mL/hr at 05/20/22 0716 NoRateChange at 05/20/22 0716   . ceFAZolin (ANCEF) 2,000 mg in sterile water 20 mL IV syringe  2,000 mg IntraVENous Once [place], Claire B, PA-C       . tranexamic acid (CYKLOKAPRON) 1,000 mg in sodium chloride 0.9 % 110 mL IVPB (mini-bag)  1,000 mg IntraVENous Once Pamalee Leyden,  MD       . tranexamic acid (CYKLOKAPRON) 1,000 mg in sodium chloride 0.9 % 110 mL IVPB (mini-bag)  1,000 mg IntraVENous Once Pamalee Leyden, MD           Allergies:    Allergies   Allergen Reactions   . Eletriptan Anaphylaxis, Shortness Of Breath and Swelling   . Indomethacin Other (See Comments) and Nausea And Vomiting       Problem List:    Patient Active Problem List   Diagnosis Code   . Abnormal glucose level R73.09   . Allergic conjunctivitis H10.10   . Pain in joint involving pelvic region and thigh M25.559   . Aseptic necrosis of bone (HCC) M87.00   . Aspiration pneumonia of lower lobe (HCC) J69.0   . 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 Bipolar 1 disorder (HCC) F31.9   . Breast pain N64.4   . Closed fracture of metacarpal bone S62.309A   . Constipation K59.00   . Enlarged prostate N40.0   . Enthesopathy of hip region M76.899   . Gallstone pancreatitis K85.10   . Gastroesophageal reflux disease K21.9   . History of arthroplasty of left knee Z96.652   . Hyperlipidemia E78.5    . Hypertrophy of breast N62   . Insomnia G47.00   . Osteoarthritis of knee M17.9   . Loss of consciousness (HCC) R40.20   . Chronic low back pain M54.50, G89.29   . Obesity with body mass index 30 or greater E66.9   . Old anterior cruciate ligament disruption M23.50   . Panic disorder without agoraphobia F41.0   . Plantar fasciitis M72.2   . Post-traumatic stress disorder, unspecified F43.10   . 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 Sleep apnea, unspecified G47.30   . Vitamin D deficiency E55.9   . Artificial knee joint present Z96.659   . Cervicalgia M54.2   . Cocaine use F14.90   . Elbow pain M25.529   . Erectile dysfunction N52.9   . History of colonic polyps Z86.010   . Hypotension I95.9   . Prediabetes R73.03       Past Medical History:        Diagnosis Date   . Arthritis    . Bipolar 1 disorder (HCC)    . Chronic pain    . History of blood transfusion    . Marijuana use     positive for Opioids 01/09/2022   . OSA (obstructive sleep apnea)     pt has Cpap, but does not use   . PTSD (post-traumatic stress disorder)    . Tachycardia     pt stated resolved-not on meds anymore.       Past Surgical History:        Procedure Laterality Date   . BACK SURGERY  2022    fx back -hardware (rods & screws)   . CHOLECYSTECTOMY  2019   . COLONOSCOPY     . KNEE ARTHROPLASTY Bilateral 2019    with revisions   . MASTECTOMY Right     due to gynecomastia       Social History:    Social History     Tobacco Use   . Smoking status: Some Days     Types: Cigarettes, Cigars     Start date: 2000     Passive exposure: Current   . Smokeless tobacco: Never   Substance Use Topics   . Alcohol use: Yes     Comment:  rarely                                Ready to quit: Not Answered  Counseling given: Not Answered      Vital Signs (Current):   Vitals:    05/03/22 1146 05/20/22 0606   BP:  131/87   Pulse:  82   Resp:  18   Temp:  98.8 F (37.1 C)   TempSrc:  Oral   SpO2:  98%   Weight: 112.5 kg (248 lb)    Height: 1.829 m (6')                                                BP Readings from Last 3 Encounters:   05/20/22 131/87   02/20/22 110/81   01/03/22 118/73       NPO Status: Time of last liquid consumption: 1800                        Time of last solid consumption: 1200                        Date of last liquid consumption: 05/19/22                        Date of last solid food consumption: 05/19/22    BMI:   Wt Readings from Last 3 Encounters:   05/03/22 112.5 kg (248 lb)   02/20/22 112.5 kg (248 lb)   01/01/22 107.5 kg (237 lb)     Body mass index is 33.63 kg/m.    CBC:   Lab Results   Component Value Date/Time    WBC 5.6 02/20/2022 02:47 PM    RBC 4.52 02/20/2022 02:47 PM    HGB 14.4 02/20/2022 02:47 PM    HCT 43.1 02/20/2022 02:47 PM    MCV 95 02/20/2022 02:47 PM    RDW 12.3 02/20/2022 02:47 PM    PLT 170 02/20/2022 02:47 PM       CMP:   Lab Results   Component Value Date/Time    NA 140 02/20/2022 02:47 PM    K 3.8 02/20/2022 02:47 PM    CL 103 02/20/2022 02:47 PM    CO2 27 02/20/2022 02:47 PM    BUN 8 02/20/2022 02:47 PM    CREATININE 0.9 02/20/2022 02:47 PM    AGRATIO 1.3 12/18/2021 10:25 AM    LABGLOM >60.0 02/20/2022 02:47 PM    GLUCOSE 89 02/20/2022 02:47 PM    CALCIUM 9.5 02/20/2022 02:47 PM    BILITOT 0.5 12/18/2021 10:25 AM    ALKPHOS 112 12/18/2021 10:25 AM    AST 17 12/18/2021 10:25 AM    ALT 7 12/18/2021 10:25 AM       POC Tests: No results for input(s): "POCGLU", "POCNA", "POCK", "POCCL", "POCBUN", "POCHEMO", "POCHCT" in the last 72 hours.    Coags: No results found for: "PROTIME", "INR", "APTT"    HCG (If Applicable): No results found for: "PREGTESTUR", "PREGSERUM", "HCG", "HCGQUANT"     ABGs: No results found for: "PHART", "PO2ART", "PCO2ART", "HCO3ART", "BEART", "O2SATART"     Type & Screen (If Applicable):  No results found for: "LABABO"    Drug/Infectious Status (If Applicable):  No results found for: "HIV", "HEPCAB"    COVID-19 Screening (If Applicable): No results found for: "COVID19"        Anesthesia Evaluation  Patient  summary reviewed  Airway: Mallampati: II          Dental: normal exam         Pulmonary:Negative Pulmonary ROS and normal exam    (+)     sleep apnea:                                  Cardiovascular:Negative CV ROS  Exercise tolerance: good (>4 METS)          Rhythm: regular  Rate: normal                    Neuro/Psych:   (+) psychiatric history: stable with treatment            GI/Hepatic/Renal: Neg GI/Hepatic/Renal ROS  (+) GERD: well controlled          Endo/Other: Negative Endo/Other ROS                    Abdominal:             Vascular: negative vascular ROS.         Other Findings:       Anesthesia Plan      general     ASA 2             Anesthetic plan and risks discussed with patient.      Plan discussed with CRNA.                Abelina Bachelor, MD   05/20/2022

## 2022-05-20 NOTE — Care Coordination-Inpatient (Signed)
05/20/22 1538   IMM Letter   Observation Status Letter date given: 05/20/22   Observation Status Letter time given: 1505   Observation Status Letter given to Patient/Family/Significant other/Guardian/POA/by: Dimas Millin, RN     Medicare Outpatient Observation Notice [facility]) provided to patient/representative with verbal explanation of the notice. Time allotted for questions regarding the notice.  Patient provided a completed copy of the MOON notice.  Copy placed on bedside chart.

## 2022-05-20 NOTE — Progress Notes (Signed)
Neuro intact   Sitting up in bed eating   Improved neck and arm pain  No output in JP  Mobilize   Pt ot

## 2022-05-20 NOTE — Brief Op Note (Signed)
Brief Postoperative Note      Patient: Brandon Montoya.  Date of Birth: 03-Nov-1965  MRN: [id number]    Date of Procedure: 05/30/2022    Pre-Op Diagnosis Codes:     * Cervical spinal stenosis [M48.02]    Post-Op Diagnosis: Same       Procedure(s):  ANTERIOR CERVICAL DECOMPRESSION FUSION CERVICAL SIX/SEVEN, CERVICAL SEVEN/THORACIC ONE; C-ARM; Victorino Dike SPINE]    Surgeon(s):  Pamalee Leyden, MD    Assistant:  Surgical Assistant: Toni Amend  Physician Assistant: Sallyanne Havers, PA-C    Anesthesia: General    Estimated Blood Loss (mL): Minimal    Complications: None    Specimens:   * No specimens in log *    Implants:  Implant Name Type Inv. Item Serial No. Manufacturer Lot No. LRB No. Used Action   ALLOGRAFT BNE SPACER 7 DEG 14.5X11.5X8 MM CERV CORTICAL CANC - [id number]  ALLOGRAFT BNE SPACER 7 DEG 14.5X11.5X8 MM CERV CORTICAL CANC [id number] [employer] HOWM-WD [id number] N/A 1 Implanted   ALLOGRAFT BNE SPACER 7 DEG 14.5X11.5X8 MM CERV CORTICAL CANC - [id number]  ALLOGRAFT BNE SPACER 7 DEG 14.5X11.5X8 MM CERV CORTICAL CANC [id number] [employer] HOWM-WD [id number] N/A 1 Implanted         Drains: * No LDAs found *    Findings:  Infection Present At Time Of Surgery (PATOS) (choose all levels that have infection present):  No infection present  Other Findings: severe stenosis    Electronically signed by Pamalee Leyden, MD on 05/30/2022 at 9:05 AM

## 2022-05-20 NOTE — Op Note (Signed)
[facility]               [address] [place], [place]  [address]                            OPERATIVE REPORT      PATIENT NAME: Brandon Montoya, Brandon Montoya                DOB: Jun 18, 1965  MED REC NO: [id number]                       ROOM: OR  ACCOUNT NO: [id number]                       ADMIT DATE: 05/20/2022  PROVIDER: Pamalee Leyden, MD    DATE OF SERVICE:  05/20/2022    PREOPERATIVE DIAGNOSES:  Cervical spondylitic stenosis with radiculopathy.    POSTOPERATIVE DIAGNOSES:  Cervical spondylitic stenosis with radiculopathy.    PROCEDURES PERFORMED:  Cervical decompression and fusion C6-7 and cervical decompression and fusion C7-T1, structural allograft x2, and cervical plate fixation, C6, C7, T1 with Stryker, and cervical plate and screws.    SURGEON:  Pamalee Leyden, MD    ASSISTANT:  Yevonne Pax, PA-C.    ANESTHESIA:  General endotracheal.    ESTIMATED BLOOD LOSS:  Minimal.    SPECIMENS REMOVED:  None.    INTRAOPERATIVE FINDINGS:  The patient has spondylitic stenosis with left-sided lateral recess foraminal stenosis due to uncinate spurring.  Most severe stenosis was C6-C7, followed by C7-T1.     COMPLICATIONS:  None.    IMPLANTS:  Stryker.    INDICATIONS:  Severe neck and radiating left arm pain.    DESCRIPTION OF PROCEDURE:  Following induction of general endotracheal anesthesia, the patient was placed with the head in neutral position in a cervical head holder.  Patient was prepped and draped in the usual fashion.  Right-sided approach was utilized.  Sternocleidomastoid and great vessels mobilized laterally.  Esophagus and trachea mobilized medially with blunt finger dissection.  Prevertebral fascia was entered.  C-arm image was used to identify the body of C6.  Caspar pin was placed in C6 and this allowed [place] to understand C7 and T1.  Caspar pins were placed in the bodies of C7 and T1.  Cloward self-retaining retractor blades were placed under the cover of longus colli musculature  bilaterally.  Annular tissue was incised and radical diskectomy done back to the posterior margin.  Posterior marginal osteophytes were thinned with a high-speed burr.  Then I resected them with a 4-0 Karlin curette and Sella punch.  We resected the posterior longitudinal ligament.  A thorough decompression done of the epidural space, endplates prepared.  A #8 structural allograft tamped firmly into place with excellent bony apposition.  This procedure was repeated at C5-6 where the stenosis was much more severe.  I had to thin out this posterior marginal osteophytes very thin and actually break them off with a 4-0 Karlin curette and then just used a 1 mm Kerrison after the main compression had been completed.  Posterior longitudinal ligament was taken.  A thorough decompression of the epidural space accomplished.  Endplates prepared.  Structural allograft again tamped into place and then a cervical locking plate was selected, Stryker type with 2 screws in C6 and C7, 2 in T1 with the locking device engaged.  The wound was copiously irrigated.  There was no  evidence of any bleeding.  I used a drain for safety.  Neck was closed in layers and the skin closed with subcuticular suture and Dermabond.  A sterile occlusive dressing was placed upon the wound.  All counts were correct.        Pamalee Leyden, MD      MBK/AQS  D:  05/20/2022 09:11:56  T:  05/20/2022 12:40:33  JOB #:  [id number] [id number]

## 2022-05-20 NOTE — Care Coordination-Inpatient (Signed)
05/20/22 1541   Service Assessment   Patient Orientation Alert and Oriented;Person;Place;Situation   Cognition Alert   History Provided By Patient   Primary Caregiver Self   Support Systems Spouse/Significant Other   PCP Verified by CM Yes   Last Visit to PCP Within last 3 months   Prior Functional Level Independent in ADLs/IADLs   Current Functional Level Independent in ADLs/IADLs   Can patient return to prior living arrangement Yes   Ability to make needs known: Good   Family able to assist with home care needs: Yes   Would you like for me to discuss the discharge plan with any other family members/significant others, and if so, who? Yes  Physicist, medical)   Financial Resources Medicare;Veteran ([place])   Social/Functional History   Lives With Significant other;Parent   Type of Home House   Home Layout One level   Home Access Stairs to enter with rails   Entrance Stairs - Number of Steps 3   Entrance Stairs - Rails Left   Bathroom Shower/Tub Tub/Shower unit   Armed forces technical officer Grab bars in shower;Grab bars around toilet   Psychiatric nurse - [place]   ADL Assistance Independent   Occupational hygienist Yes   Mode of Transportation SUV   Occupation On disability   Discharge Planning   Type of Residence House   Living Arrangements Spouse/Significant Other   Current Services Prior To Admission C-pap   Potential Assistance Needed Home Care   DME Ordered? No   Potential Assistance Purchasing Medications No   Type of Home Care Services PT;OT   Patient expects to be discharged to: [facility] At/After Discharge   Transition of Care Consult (CM Consult) Home Health   Internal Home Health No   Reason Outside Agency Chosen Out of service area   [facility] Information Provided? No   Mode of Transport at Discharge Other (see comment)  (Wife at bedside to transport)   Confirm Follow Up  Transport Family   Condition of Participation: Discharge Planning   The Plan for Transition of Care is related to the following treatment goals: Home with Home health and available resources     Dimas Millin BSN, RN  Case Management

## 2022-05-20 NOTE — Other (Signed)
Rounded on patient s/p ANTERIOR CERVICAL DECOMPRESSION FUSION CERVICAL SIX/SEVEN, CERVICAL SEVEN/THORACIC ONE  with Dr. Carney Living, [facility] 05/20/2022. Patient observed to be alert and oriented x 3, sitting up in bed. He denies chest pain, shortness of breath,nausea, or vomiting. He denies sort throat or difficulty swallowing at this time.  He states that pain is well controlled at present. He denies any pain or numbness in his upper or lower extremities. Cervical collar and jp drain in place. Collar removed and dressing is observed to be clean, dry and intact. Patient reminded that soft collar is for comfort wear only and may be removed for showering and for eating. Reviewed post operative showering instructions with patient. Patient reminded that he may shower in three days, no tubs or submersion in water. He is to contact clinic with any dressing issues. Encouraged patient to not lift anything heavier than a coffee cup, no extreme movements of neck. Educated patient on the importance of soft foods , should he start experiencing difficulty with swallowing when his swelling sets in. Encouraged patient to sleep with head elevated for the next week and to ice over incisional area to assist with swelling. Reminded patient that the inability to swallow his own saliva is an emergency call to Dr. Carney Living. Patient verbalized understanding of all information provided. All questions were answered.

## 2022-05-20 NOTE — Interval H&P Note (Signed)
Update History & Physical    The patient's History and Physical of May 17, 2022 was reviewed with the patient and I examined the patient. There was no change. The surgical site was confirmed by the patient and me.     Plan: The risks, benefits, expected outcome, and alternative to the recommended procedure have been discussed with the patient. Patient understands and wants to proceed with the procedure.     Electronically signed by Pamalee Leyden, MD on 05/20/2022 at 6:24 AM

## 2022-05-20 NOTE — Progress Notes (Signed)
OCCUPATIONAL THERAPY EVALUATION/DISCHARGE    Patient: Brandon Montoya. (57 y.o. male)  Date: 05/20/2022  Primary Diagnosis: Cervical spinal stenosis [M48.02]  Cervical radiculopathy due to osteoarthritis of spine [M47.22]  Procedure(s) (LRB):  ANTERIOR CERVICAL DECOMPRESSION FUSION CERVICAL SIX/SEVEN, CERVICAL SEVEN/THORACIC ONE; C-ARM; [STRYKER SPINE] (N/A) Day of Surgery   Precautions: Surgical Protocols, General Precautions,  ,  ,  ,  , Spinal Precautions: No Bending, No Lifting, No Twisting,  ,    PLOF: Mod I with ADLs & functional mobility      ASSESSMENT AND RECOMMENDATIONS:  Pt presents sitting up in recliner chair, fiance present. Pt has no c/o pain and soft cervical collar intact, pt verbalized understanding for cervical precautions with 100% accuracy. Pt demonstrates ability to perform LB dressing for doff/don slipper socks Mod I using cross leg technique. STS with Supv, functional mobility using RW for simulated toilet transfer (d/t pt declining need to void) from recliner chair -> sitting edge of bed with Supv, no LOB. Bed mobility: Mod I sit -> supine. Pt issiued AE: LH bathsponge tpo grade washing Les/feet and back easier.  Pt laying semi-reclined in bed at end of tx session, call bell within reach & pt verbalized understanding to utilize for assist e.g. functional transfers in order to prevent falls.     Maximum therapeutic gains met at current level of care and patient will be discharged from occupational therapy at this time.    Further Equipment Recommendations for Discharge: shower chair    AMPAC: AM-PAC Inpatient Daily Activity Raw Score: 21    At this time and based on an AM-PAC score, no further OT is recommended upon discharge due to patient at baseline functional status.  Recommend patient returns to prior setting with prior services.    This AMPAC score should be considered in conjunction with interdisciplinary team recommendations to determine the most appropriate discharge setting.  Patient's social support, diagnosis, medical stability, and prior level of function should also be taken into consideration.     SUBJECTIVE:   Patient stated "I have grab bars around the toilet and it helps a lot."    OBJECTIVE DATA SUMMARY:     Past Medical History:   Diagnosis Date    Arthritis     Bipolar 1 disorder (HCC)     Chronic pain     History of blood transfusion     Marijuana use     positive for Opioids 01/09/2022    OSA (obstructive sleep apnea)     pt has Cpap, but does not use    PTSD (post-traumatic stress disorder)     Tachycardia     pt stated resolved-not on meds anymore.     Past Surgical History:   Procedure Laterality Date    BACK SURGERY  2022    fx back -hardware (rods & screws)    CHOLECYSTECTOMY  2019    COLONOSCOPY      KNEE ARTHROPLASTY Bilateral 2019    with revisions    MASTECTOMY Right     due to gynecomastia       Home Situation:   Social/Functional History  Lives With: Significant other, Parent  Type of Home: House  Home Layout: One level  Home Access: Stairs to enter with rails  Entrance Stairs - Number of Steps: 3  Entrance Stairs - Rails: Left  Bathroom Shower/Tub: Medical sales representative: Standard  Bathroom Equipment: Grab bars in shower, Grab bars around toilet  Bathroom Accessibility: Accessible  Home  Equipment: Ephraim Hamburger - Rolling  Has the patient had two or more falls in the past year or any fall with injury in the past year?: Unknown  ADL Assistance: Independent  Ambulation Assistance: Independent  Transfer Assistance: Independent  Active Driver: Yes  Mode of Transportation: SUV  Occupation: On disability  []   Right hand dominant   []   Left hand dominant    Cognitive/Behavioral Status:  Orientation  Overall Orientation Status: Within Normal Limits  Orientation Level: Oriented X4  Cognition  Overall Cognitive Status: WNL    Skin: s/p anterior cervical sx  Edema: s/p anterior cervical sx    Vision/Perceptual:    Vision  Vision: Within Functional Limits        Coordination: BUE  Coordination: Generally decreased, functional       Balance:     Balance  Sitting: Intact  Standing: Intact    Strength: BUE  Strength: Generally decreased, functional (MMT NT d/t cervical precautions)    Tone & Sensation: BUE  Tone: Normal       Range of Motion: BUE  AROM: Within functional limits       Functional Mobility and Transfers for ADLs:  Bed Mobility:     Bed Mobility Training  Bed Mobility Training: Yes  Rolling: Modified independent  Supine to Sit: Modified independent  Sit to Supine: Modified independent  Scooting: Modified independent  Transfers:                 Art therapist: Yes  Sit to Stand: Modified independent  Stand to Sit: Modified independent    ADL Assessment:   Feeding: Modified independent   Grooming: Modified independent   UE Bathing: Supervision  LE Bathing: Supervision  UE Dressing: Modified independent   LE Dressing: Modified independent   Toileting: Modified independent   Functional Mobility: Modified independent         Pain:  Intensity Pre-treatment: 0/10   Intensity Post-treatment: 0/10    Activity Tolerance:   Activity Tolerance: Patient Tolerated treatment well  Please refer to the flowsheet for vital signs taken during this treatment.    After treatment:   []  Patient left in no apparent distress sitting up in chair  [x]  Patient left in no apparent distress in bed  [x]  Call bell left within reach  [x]  Nursing notified  []  Caregiver present  []  Bed alarm activated    COMMUNICATION/EDUCATION:   Patient Education  Education Given To: Patient;Family  Education Provided: Role of Therapy;ADL Adaptive Strategies;Transfer Training;Energy Administrator, sports;Fall Prevention Strategies;Equipment;Family Education;Precautions  Education Method: Demonstration;Verbal;Teach Back  Barriers to Learning: None  Education Outcome: Verbalized understanding    Thank you for this referral.  Mathis Dad, OT  Minutes: 16    Eval Complexity:  Decision Making: Medium Complexity

## 2022-05-20 NOTE — Anesthesia Post-Procedure Evaluation (Signed)
Department of Anesthesiology  Postprocedure Note    Patient: Avanti Trudeau.  MRN: [id number]  Birthdate: 01-16-1965  Date of evaluation: 05/20/2022    Procedure Summary       Date: 05/20/22 Room / Location: [facility] MAIN 06 / [facility] MAIN OR    Anesthesia Start: 0728 Anesthesia Stop: 0944    Procedure: ANTERIOR CERVICAL DECOMPRESSION FUSION CERVICAL SIX/SEVEN, CERVICAL SEVEN/THORACIC ONE; C-ARMVictorino Dike SPINE] (Spine Cervical) Diagnosis:       Cervical spinal stenosis      (Cervical spinal stenosis [M48.02])    Surgeons: Pamalee Leyden, MD Responsible Provider: Abelina Bachelor, MD    Anesthesia Type: General ASA Status: 2            Anesthesia Type: General    Aldrete Phase I:      Aldrete Phase II:      Anesthesia Post Evaluation    Patient location during evaluation: bedside  Patient participation: complete - patient participated  Level of consciousness: awake  Pain score: 3  Airway patency: patent  Nausea & Vomiting: no nausea  Cardiovascular status: hemodynamically stable  Respiratory status: acceptable  Hydration status: euvolemic  Pain management: satisfactory to patient        No notable events documented.

## 2022-05-20 NOTE — Plan of Care (Signed)
Problem: Pain  Goal: Verbalizes/displays adequate comfort level or baseline comfort level  05/20/2022 2039 by Jolyn Lent, RN  Outcome: Progressing  05/20/2022 1352 by Charmayne Sheer, RN  Outcome: Progressing     Problem: Safety - Adult  Goal: Free from fall injury  05/20/2022 2039 by Jolyn Lent, RN  Outcome: Progressing  05/20/2022 1352 by Charmayne Sheer, RN  Outcome: Progressing     Problem: Discharge Planning  Goal: Discharge to home or other facility with appropriate resources  05/20/2022 2039 by Jolyn Lent, RN  Outcome: Progressing  05/20/2022 1352 by Charmayne Sheer, RN  Outcome: Progressing     Problem: ABCDS Injury Assessment  Goal: Absence of physical injury  05/20/2022 2039 by Jolyn Lent, RN  Outcome: Progressing  05/20/2022 1352 by Charmayne Sheer, RN  Outcome: Progressing

## 2022-05-21 LAB — CBC
Hematocrit: 42.3 % (ref 36.0–48.0)
Hemoglobin: 13.9 g/dL (ref 13.0–16.0)
MCH: 32.1 PG (ref 24.0–34.0)
MCHC: 32.9 g/dL (ref 31.0–37.0)
MCV: 97.7 FL (ref 78.0–100.0)
MPV: 10.5 FL (ref 9.2–11.8)
Nucleated RBCs: 0 PER 100 WBC
Platelets: 139 10*3/uL (ref 135–420)
RBC: 4.33 M/uL — ABNORMAL LOW (ref 4.35–5.65)
RDW: 12.5 % (ref 11.6–14.5)
WBC: 9.3 10*3/uL (ref 4.6–13.2)
nRBC: 0 10*3/uL (ref 0.00–0.01)

## 2022-05-21 LAB — BASIC METABOLIC PANEL
Anion Gap: 6 mmol/L (ref 3.0–18)
BUN/Creatinine Ratio: 14 (ref 12–20)
BUN: 17 MG/DL (ref 7.0–18)
CO2: 27 mmol/L (ref 21–32)
Calcium: 8.9 MG/DL (ref 8.5–10.1)
Chloride: 105 mmol/L (ref 100–111)
Creatinine: 1.22 MG/DL (ref 0.6–1.3)
Est, Glom Filt Rate: 70 mL/min/{1.73_m2} (ref >60)
Glucose: 110 mg/dL — ABNORMAL HIGH (ref 74–99)
Potassium: 4.2 mmol/L (ref 3.5–5.5)
Sodium: 138 mmol/L (ref 136–145)

## 2022-05-21 MED FILL — TAMSULOSIN HCL 0.4 MG PO CAPS: 0.4 MG | ORAL | Qty: 1

## 2022-05-21 MED FILL — ARIPIPRAZOLE 2 MG PO TABS: 2 MG | ORAL | Qty: 1

## 2022-05-21 MED FILL — MONTELUKAST SODIUM 10 MG PO TABS: 10 MG | ORAL | Qty: 1

## 2022-05-21 MED FILL — QUETIAPINE FUMARATE 100 MG PO TABS: 100 MG | ORAL | Qty: 2

## 2022-05-21 MED FILL — POLYETHYLENE GLYCOL 3350 17 G PO PACK: 17 g | ORAL | Qty: 1

## 2022-05-21 MED FILL — BISACODYL EC 5 MG PO TBEC: 5 MG | ORAL | Qty: 1

## 2022-05-21 MED FILL — OXYCODONE HCL 5 MG PO TABS: 5 MG | ORAL | Qty: 2

## 2022-05-21 MED FILL — ACETAMINOPHEN 325 MG PO TABS: 325 MG | ORAL | Qty: 2

## 2022-05-21 MED FILL — FAMOTIDINE 20 MG PO TABS: 20 MG | ORAL | Qty: 1

## 2022-05-21 NOTE — Progress Notes (Signed)
Patient tolerate medications and treatment well, no changes during this shift, no discomfort.

## 2022-05-21 NOTE — Progress Notes (Signed)
Jp drain removed from R side of patients neck. Site dressed with gauze and Tegaderm. Patient and spouse educated about dressing.

## 2022-05-21 NOTE — Progress Notes (Signed)
Chaplain conducted a pre-surgery visit with Brandon Genera., who is a 57 y.o.,male.     The Chaplain provided the following Interventions:  Initiated a relationship of care and support with patient on day one post cervical surgery.  There is no advance directive on file.   Offered prayer on patient's behalf.     Plan:  Chaplains will continue to follow and will provide pastoral care on an as needed/requested basis.  Chaplain recommends bedside caregivers page chaplain on duty if patient shows signs of acute spiritual or emotional distress.  Water quality scientist   Spiritual Care   [phone]

## 2022-05-21 NOTE — Progress Notes (Signed)
Vss afeb  Neuro intact  Arm pain resolved  Pt ot  Dc home  Fu 2 weeks

## 2022-05-21 NOTE — Progress Notes (Signed)
Discharge teaching completed at bedside with patient. Opportunity provided for  clarifying questions. All answered to patient satisfaction. IV(2) removed. ID removed and shredded. 2 ice bags given to patient.

## 2022-05-23 NOTE — Telephone Encounter (Signed)
Call placed to patient , ID verified x 2. Patient is s/p ANTERIOR CERVICAL DECOMPRESSION FUSION CERVICAL SIX/SEVEN, CERVICAL SEVEN/THORACIC ONE  with Dr. Carney Living, [facility] 05/20/2022.  He denies chest pain, shortness of breath, nausea, vomiting, fever or chills. He denies any numbness or pain to his upper extremities. He denies any difficulty with bowel or bladder. He states that pain has been well controlled. He denies any difficulty swallowing, just a minor sore throat. He continues to wear his soft collar for comfort and is icing over the incisional area to assist with swelling. Per patient home PT has been out working with him. He had some drainage on the gauze from where his drain was pulled but otherwise dressing remains clean, dry and intact. He is ambulating and repositioning. He continues to sleep with his head elevated. Overall he feels he is doing very well. He has no questions or concerns at this time. He will follow up with Dr. Carney Living in two weeks or sooner if needed.

## 2022-07-25 ENCOUNTER — Encounter

## 2022-07-26 ENCOUNTER — Inpatient Hospital Stay: Admit: 2022-07-26 | Payer: MEDICARE | Attending: Orthopaedic Surgery | Primary: Family Medicine

## 2022-07-26 DIAGNOSIS — S32009A Unspecified fracture of unspecified lumbar vertebra, initial encounter for closed fracture: Secondary | ICD-10-CM

## 2022-07-30 MED ORDER — QUETIAPINE FUMARATE 200 MG PO TABS
200 MG | ORAL_TABLET | Freq: Every evening | ORAL | 1 refills | Status: DC
Start: 2022-07-30 — End: 2022-08-15

## 2022-07-30 MED ORDER — PANTOPRAZOLE SODIUM 40 MG PO TBEC
40 MG | ORAL_TABLET | Freq: Every day | ORAL | 1 refills | Status: DC
Start: 2022-07-30 — End: 2022-08-15

## 2022-07-30 MED ORDER — ATORVASTATIN CALCIUM 20 MG PO TABS
20 MG | ORAL_TABLET | Freq: Every day | ORAL | 1 refills | Status: DC
Start: 2022-07-30 — End: 2022-08-15

## 2022-08-12 ENCOUNTER — Ambulatory Visit: Payer: MEDICARE | Primary: Family Medicine

## 2022-08-12 DIAGNOSIS — Z01818 Encounter for other preprocedural examination: Secondary | ICD-10-CM

## 2022-08-12 LAB — CBC
Hematocrit: 42.6 % (ref 36.0–48.0)
Hemoglobin: 14.4 g/dL (ref 13.0–16.0)
MCH: 32.7 PG (ref 24.0–34.0)
MCHC: 33.8 g/dL (ref 31.0–37.0)
MCV: 96.8 FL (ref 78.0–100.0)
MPV: 10.7 FL (ref 9.2–11.8)
Nucleated RBCs: 0 PER 100 WBC
Platelets: 150 10*3/uL (ref 135–420)
RBC: 4.4 M/uL (ref 4.35–5.65)
RDW: 12.2 % (ref 11.6–14.5)
WBC: 4 10*3/uL — ABNORMAL LOW (ref 4.6–13.2)
nRBC: 0 10*3/uL (ref 0.00–0.01)

## 2022-08-12 LAB — COMPREHENSIVE METABOLIC PANEL
ALT: 15 U/L — ABNORMAL LOW (ref 16–61)
AST: 13 U/L (ref 10–38)
Albumin/Globulin Ratio: 1.1 (ref 0.8–1.7)
Albumin: 3.6 g/dL (ref 3.4–5.0)
Alk Phosphatase: 102 U/L (ref 45–117)
Anion Gap: 2 mmol/L — ABNORMAL LOW (ref 3.0–18)
BUN/Creatinine Ratio: 10 — ABNORMAL LOW (ref 12–20)
BUN: 9 MG/DL (ref 7.0–18)
CO2: 31 mmol/L (ref 21–32)
Calcium: 9.4 MG/DL (ref 8.5–10.1)
Chloride: 107 mmol/L (ref 100–111)
Creatinine: 0.94 MG/DL (ref 0.6–1.3)
Est, Glom Filt Rate: >90 mL/min/{1.73_m2} (ref >60)
Globulin: 3.4 g/dL (ref 2.0–4.0)
Glucose: 100 mg/dL — ABNORMAL HIGH (ref 74–99)
Potassium: 4.2 mmol/L (ref 3.5–5.5)
Sodium: 140 mmol/L (ref 136–145)
Total Bilirubin: 0.4 MG/DL (ref 0.2–1.0)
Total Protein: 7 g/dL (ref 6.4–8.2)

## 2022-08-13 LAB — CULTURE, MRSA, SCREENING

## 2022-08-13 LAB — HEMOGLOBIN A1C
Estimated Avg Glucose: 117 mg/dL
Hemoglobin A1C: 5.7 % — ABNORMAL HIGH (ref 4.2–5.6)

## 2022-08-14 LAB — EKG 12-LEAD
Atrial Rate: 74 {beats}/min
Diagnosis: NORMAL
P Axis: 65 degrees
P-R Interval: 180 ms
Q-T Interval: 400 ms
QRS Duration: 88 ms
QTc Calculation (Bazett): 444 ms
R Axis: 21 degrees
T Axis: 42 degrees
Ventricular Rate: 74 {beats}/min

## 2022-08-15 ENCOUNTER — Inpatient Hospital Stay: Payer: MEDICARE | Primary: Family Medicine

## 2022-08-15 NOTE — Other (Signed)
PAT Activity Status Questionnaire       Yes No Points for YES    Are you able to climb a flight of stairs or walk up a hill? [x]  []  1   Are you able to do heavy work around the house like lifting or moving heavy furniture? []  [x]  1   Do yardwork like raking leaves, weeding or pushing a power mower? [x]  []  1   Participate in strenuous sports like swimming, singles tennis, football, basketball or skiing? []  [x]  1   Total Score:   DASI 2     SURGICAL PRE-ADMISSION INSTRUCTIONS    Your Medication instructions:    Take these medications the morning of surgery with a sip of water: Atorvastatin, Clonazepam, Pantoprazole and Quetiapine    Follow surgeon's instructions for stopping NSAIDs (i.e. Ibuprofen/Motrin, Advil, Aleve/Naproxen, Voltaren cream or tablet, Mobic/Meloxicam, Excedrin, Celebrex, etc). If no instructions provided stop NSAIDs 7 days prior to surgery.    Stop supplements and vitamins (except multivitamins) 2 weeks prior to surgery.  General Day Surgery Instructions    Do NOT eat or drink anything, including water, ice chips, soda, candy, or gum after MIDNIGHT on the night before surgery, unless you have specific instructions from your Surgeon or Anesthesia Provider to do so.   Please follow instructions for cleaning skin with CHG wash kit 3 days prior to surgery  Please shower with antibacterial bar soap 2 hours prior to arrival on day of surgery.  No smoking tobacco products 24 hours before surgery.  No recreational drugs for one week prior to the day of surgery.  Remove all jewelry, no lotions, powders, deodorant, or cologne/after shave.  Leave all valuables, including money/purse/wallet, jewelry, laptop, etc.. at home.  Bring durable medical equipment (DME) needed: check with surgeon's office if you need a walker or back brace.   Glasses may be worn to the hospital.  They will be removed prior to surgery.  Call your doctor if you have symptoms of a cold or illness, rash, wounds, or open areas on your skin  prior to surgery.  Please notify Preanesthesia Testing if you have any medication changes after your PAT phone appointment.  Only 1 visitor is allowed to accompany you on the day of surgery.  AN ADULT MUST DRIVE YOU HOME AFTER OUTPATIENT SURGERY.   If you are having an OUTPATIENT procedure, please make arrangements for a responsible adult to be with you for 24 hours after your surgery.  If you are admitted to the hospital, you will be assigned to a bed after surgery is complete.  The staff will inform you and your family if any delays occur on the day of surgery.    Arrival time will be provided to you by your surgeon's office on the day before your procedure/surgery.    Patient verbalized understanding of instructions provided during pre-anesthesia phone interview.   Sent above surgical instructions to patient via MyChart message. Encouraged patient to check MyChart for the written surgery instructions.

## 2022-08-21 ENCOUNTER — Ambulatory Visit: Admit: 2022-08-21 | Discharge: 2022-08-21 | Payer: MEDICARE | Attending: Family Medicine | Primary: Family Medicine

## 2022-08-21 DIAGNOSIS — Z0000 Encounter for general adult medical examination without abnormal findings: Secondary | ICD-10-CM

## 2022-08-21 NOTE — ACP (Advance Care Planning) (Signed)
Advance Care Planning     Advance Care Planning (ACP) Physician/NP/PA Conversation    Date of Conversation: 08/21/2022  Conducted with: Patient with Decision Making Capacity  Other persons present: None    Healthcare Decision Maker: No healthcare decision makers have been documented.     Today we documented Decision Maker(s) consistent with Legal Next of Kin hierarchy.    Care Preferences:    Hospitalization:  "If your health worsens and it becomes clear that your chance of recovery is unlikely, what would be your preference regarding hospitalization?"  The patient would prefer hospitalization.    Ventilation:  "If you were unable to breath on your own and your chance of recovery was unlikely, what would be your preference about the use of a ventilator (breathing machine) if it was available to you?"  The patient would desire the use of a ventilator.    Resuscitation:  "In the event your heart stopped as a result of an underlying serious health condition, would you want attempts made to restart your heart, or would you prefer a natural death?"  Yes, attempt to resuscitate.    treatment goals, ventilation preferences, hospitalization preferences, and resuscitation preferences    Conversation Outcomes / Follow-Up Plan:  ACP in process - completing/providing documents  Reviewed DNR/DNI and patient elects Full Code (Attempt Resuscitation)    Length of Voluntary ACP Conversation in minutes:  16 minutes    Jojo Pehl Kathe Mariner, MD

## 2022-08-21 NOTE — Progress Notes (Signed)
HPI  Brandon Montoya. comes in for preop evaluation.  Referring physician: Dr. Guilford Shi.  Date of procedure: 08/28/2022  Planned procedure:LUMBAR TWO/THREE FUSION EXTENSION THORACIC TEN- LUMBAR TWO FUSION WITH C-ARM (Spine Lumbar)   Indication for procedure: Mid and low back pain    Brandon Montoya. is a patient who comes in for preop evaluation.  He has mid and low back pain.  States that he fell and injured his mid back.  He was seen by his orthopedist.  He is noted to have compression fracture of the vertebrae.  He comes in for preop evaluation.  He has persistent back pain with any activity and activities of daily living.  He has tried conservative measures but pain persists.  Patient has had CT scan of the lumbar spine that was reported as:  IMPRESSION:  Posterolateral fusion hardware T10-L2. The T10 and T11 levels are not included  in the lumbar CT field of view.   -Minimal 2 mm lucency around both L2 screws. Chronic L2 pars defects. Posterior  paraspinal bone graft greater on the right without solid osseus fusion.   Chronic compression fracture with central split T12 vertebral body. No acute  fracture.  Multilevel facet arthropathy. Grade 1 anterolisthesis L4 on L5.   -Foraminal stenosis most pronounced at L2-3   Patient has had a EKG done that is normal.  He had lab work done.  CBC and electrolytes are reassuring.  He is medically stabilized and cleared to proceed with planned procedure.    Mood disorder: Patient has mood disorder with anxiety and depression.  He is followed up through the [place].  He is on fluoxetine, Seroquel and clonazepam.  He will continue with these medications as recommended by his behavioral health specialist.  Dyslipidemia: Patient has dyslipidemia.  He is on atorvastatin.  Tolerating medication.  He will exercise and take a diet low in polysaturated fats.  GERD: Patient has gastroesophageal reflux disease.  Gets heartburn on and off.  Denies dark stools or hematemesis.  He is on  Protonix.  Continue current treatment plan.  Chronic pain: Patient has chronic pain.  He uses medical marijuana as needed.  Health maintenance: Patient will have hepatitis C antibody screening test done.  Will check PSA to screen for prostate cancer.        Past Medical History  Past Medical History:   Diagnosis Date    Anxiety and depression 1994    on meds. "was in [employer]"    Arthritis 2019    hx bilateral knee surgery & revisions    Chronic pain     bilateral knees & lower back    GERD (gastroesophageal reflux disease) 2002    Pantoprazole    History of blood transfusion     in [place] with one of the knee replacement surgery    Hx of chest pain 2019    & syncope- resolved. no cardiologist    Hyperlipidemia 2019    Atorvastatin    Marijuana use     positive for Opioids 01/09/2022    OSA (obstructive sleep apnea)     couldn't tolerated CPAP.    PTSD (post-traumatic stress disorder) 1994    on meds. "was in [employer]". Saw therapist in the past       Surgical History  Past Surgical History:   Procedure Laterality Date    BACK SURGERY  2022    fx back -hardware (rods & screws)    CERVICAL FUSION N/A 05/20/2022  ANTERIOR CERVICAL DECOMPRESSION FUSION CERVICAL SIX/SEVEN, CERVICAL SEVEN/THORACIC ONE; C-ARM; [STRYKER SPINE] performed by Pamalee Leyden, MD at [facility] MAIN OR    CHOLECYSTECTOMY, LAPAROSCOPIC  07/03/2017    COLONOSCOPY      ESOPHAGOGASTRODUODENOSCOPY  09/30/2018    KNEE ARTHROPLASTY Bilateral 2019    with revisions    MASTECTOMY Right     due to gynecomastia        Medications  Current Outpatient Medications   Medication Sig Dispense Refill    FLUoxetine (PROZAC) 20 MG capsule Take 2 capsules by mouth      atorvastatin (LIPITOR) 20 MG tablet Take 1 tablet by mouth daily Indications: high cholesterol      pantoprazole (PROTONIX) 40 MG tablet Take 1 tablet by mouth daily Indications: GERD      QUEtiapine (SEROQUEL) 200 MG tablet Take 1 tablet by mouth 2 times daily Indications: Anxiety/depression/PTSD       medical marijuana Take by mouth as needed.      clonazePAM (KLONOPIN) 1 MG tablet Take 2 tablets by mouth 2 times daily. Indications: Anxiety/depression/PTSD       No current facility-administered medications for this visit.       Allergies  Allergies   Allergen Reactions    Eletriptan Anaphylaxis, Shortness Of Breath and Swelling    Indomethacin Nausea And Vomiting       Family History  History reviewed. No pertinent family history.    Social History  Social History     Socioeconomic History    Marital status: Married     Spouse name: Not on file    Number of children: Not on file    Years of education: Not on file    Highest education level: Not on file   Occupational History    Not on file   Tobacco Use    Smoking status: Former     Types: Cigarettes, Cigars     Start date: 2000     Passive exposure: Current    Smokeless tobacco: Never   Vaping Use    Vaping status: Never Used   Substance and Sexual Activity    Alcohol use: Yes     Comment: rarely    Drug use: Yes     Types: Marijuana Sheran Fava)     Comment: medical marijuana. hold 7 days before surgery    Sexual activity: Not on file   Other Topics Concern    Not on file   Social History Narrative    Not on file     Social Determinants of Health     Financial Resource Strain: Low Risk  (02/20/2022)    Overall Financial Resource Strain (CARDIA)     Difficulty of Paying Living Expenses: Not hard at all   Food Insecurity: No Food Insecurity (05/20/2022)    Hunger Vital Sign     Worried About Running Out of Food in the Last Year: Never true     Ran Out of Food in the Last Year: Never true   Transportation Needs: No Transportation Needs (05/20/2022)    PRAPARE - Therapist, art (Medical): No     Lack of Transportation (Non-Medical): No   Physical Activity: Inactive (08/21/2022)    Exercise Vital Sign     Days of Exercise per Week: 0 days     Minutes of Exercise per Session: 0 min   Stress: Not on file   Social Connections: Not on file   Intimate  Partner Violence:  Not on file   Housing Stability: Low Risk  (05/20/2022)    Housing Stability Vital Sign     Unable to Pay for Housing in the Last Year: No     Number of Places Lived in the Last Year: 1     Unstable Housing in the Last Year: No       Review of Systems  Review of Systems - Review of all systems is negative except as noted above in the HPI.    Vital Signs  BP 109/72   Pulse 84   Temp 98.2 F (36.8 C)   Resp 20   Ht 1.854 m (6\' 1" )   Wt 106.6 kg (235 lb)   SpO2 97%   BMI 31.00 kg/m       Physical Exam  Physical Examination: General appearance - oriented to person, place, and time, overweight, and acyanotic, in no respiratory distress  Mental status - alert, oriented to person, place, and time  Neck -limited range of motion.  Has had surgery to the cervical spine.  Lymphatics - no palpable lymphadenopathy, no hepatosplenomegaly  Chest - clear to auscultation, no wheezes, rales or rhonchi, symmetric air entry  Heart - normal rate and regular rhythm, S1 and S2 normal  Abdomen - no rebound tenderness noted  Back exam - limited range of motion, paralumbar muscle tightness  Neurological - alert, oriented, normal speech  Musculoskeletal - osteoarthritic changes noted in both hands  Extremities - no pedal edema noted, intact peripheral pulses        Results  No results found for this visit on 08/21/22.    ASSESSMENT and PLAN    ICD-10-CM    1. Preop examination  Z01.818       2. Screening for malignant neoplasm of prostate  Z12.5 PSA Screening      3. Need for hepatitis C screening test  Z11.59 Hepatitis C Antibody      4. Severe major depression, single episode, with psychotic features (HCC)  F32.3       5. Chronic midline low back pain, unspecified whether sciatica present  M54.50     G89.29       6. Hyperlipidemia, unspecified hyperlipidemia type  E78.5       7. Gastro-esophageal reflux disease without esophagitis  K21.9       8. Bipolar 1 disorder (HCC)  F31.9       9. ACP (advance care planning)   Z71.89       lab results and schedule of future lab studies reviewed with patient  reviewed diet, exercise and weight control  cardiovascular risk and specific lipid/LDL goals reviewed  reviewed medications and side effects in detail      I have discussed the diagnosis with the patient and the intended plan of care as seen in the above orders. The patient has received an after-visit summary and questions were answered concerning future plans. I have discussed medication, side effects, and warnings with the patient in detail. The patient verbalized understanding and is in agreement with the plan of care. The patient will contact the office with any additional concerns.    Aya Geisel Kathe Mariner, MD    PLEASE NOTE:   This document has been produced using voice recognition software. Unrecognized errors in transcription may be present

## 2022-08-21 NOTE — Progress Notes (Signed)
1. "Have you been to the ER, urgent care clinic since your last visit?  Hospitalized since your last visit?"No    2. "Have you seen or consulted any other health care providers outside of the [facility] since your last visit?" No    3. For patients aged 56-75: Has the patient had a colonoscopy / FIT/ Cologuard? Yes      If the patient is male:    4. For patients aged 40-74: Has the patient had a mammogram within the past 2 years? Not applicable      5. For patients aged 21-65: Has the patient had a pap smear? Not applicable

## 2022-08-21 NOTE — Progress Notes (Signed)
Medicare Annual Wellness Visit    Brandon Montoya. is here for [employer], Pre-op Exam (08-28-2022   Dr Denyse [facility]ass [facility] EKG  x-ray completed), and Follow-up Chronic Condition    Assessment & Plan    Diagnosis Orders   1. Medicare annual wellness visit, subsequent        2. Screening for malignant neoplasm of prostate  PSA Screening      3. Need for hepatitis C screening test  Hepatitis C Antibody      4. Severe major depression, single episode, with psychotic features (HCC)        5. Preop examination        6. Chronic midline low back pain, unspecified whether sciatica present        7. Hyperlipidemia, unspecified hyperlipidemia type        8. Gastro-esophageal reflux disease without esophagitis        9. Bipolar 1 disorder (HCC)        10. ACP (advance care planning)          Recommendations for Preventive Services Due: see orders and patient instructions/AVS.  Recommended screening schedule for the next 5-10 years is provided to the patient in written form: see Patient Instructions/AVS.     Return in about 4 months (around 12/21/2022), or if symptoms worsen or fail to improve, for back pain, mood disorder.     Subjective   Brandon Genera. comes in for Medicare wellness exam.      Patient's complete Health Risk Assessment and screening values have been reviewed and are found in Flowsheets. The following problems were reviewed today and where indicated follow up appointments were made and/or referrals ordered.    Positive Risk Factor Screenings with Interventions:    Fall Risk:  Do you feel unsteady or are you worried about falling? : (!) yes  2 or more falls in past year?: no  Fall with injury in past year?: (!) yes     Interventions:    Reviewed medications, home hazards, visual acuity, and co-morbidities that can increase risk for falls  Patient advised to follow-up in this office for further evaluation and treatment       Drug Use:   Substance and Sexual Activity   Drug Use Yes    Types:  Marijuana Sheran Fava)    Comment: medical marijuana. hold 7 days before surgery     Interventions:  Patient advised to follow up in the office for further evaluation and treatment     Controlled Medication Review:    Today's Pain Level: No data recorded   Opioid Risk: (Low risk score <55) Opioid risk score: 30    Patient is low risk for opioid use disorder or overdose.    Last PDMP Mark as Reviewed:  Review User Review Instant Review Result                   Inactivity:  On average, how many days per week do you engage in moderate to strenuous exercise (like a brisk walk)?: 0 days (!) Abnormal  On average, how many minutes do you engage in exercise at this level?: 0 min  Interventions:  Patient advised to follow up in the office for further evaluation and treatment    Poor Eating Habits/Diet:  Do you eat balanced/healthy meals regularly?: (!) No  Interventions:  Patient advised to follow-up in this office for further evaluation and treatment    Abnormal BMI (obese):  Body mass index is 31 kg/m. (!) Abnormal  Interventions:  Patient advised to follow-up in this office for further evaluation and treatment      Dentist Screen:  Have you seen the dentist within the past year?: (!) No    Intervention:  Advised to schedule with their dentist     Vision Screen:  Do you have difficulty driving, watching TV, or doing any of your daily activities because of your eyesight?: No  Have you had an eye exam within the past year?: (!) No  Interventions:   Patient encouraged to make appointment with their eye specialist      Advanced Directives:  Do you have a Living Will?: (!) No    Intervention:  see ACP note        Objective   Vitals:    08/21/22 1352 08/21/22 1402   BP: 95/65 109/72   Pulse: 84    Resp: 20    Temp: 98.2 F (36.8 C)    SpO2: 97%    Weight: 106.6 kg (235 lb)    Height: 1.854 m (6\' 1" )       Body mass index is 31 kg/m.                 Allergies   Allergen Reactions    Eletriptan Anaphylaxis, Shortness Of Breath and  Swelling    Indomethacin Nausea And Vomiting     Prior to Visit Medications    Medication Sig Taking? Authorizing Provider   FLUoxetine (PROZAC) 20 MG capsule Take 2 capsules by mouth Yes [provider]   atorvastatin (LIPITOR) 20 MG tablet Take 1 tablet by mouth daily Indications: high cholesterol Yes [provider]   pantoprazole (PROTONIX) 40 MG tablet Take 1 tablet by mouth daily Indications: GERD Yes [provider]   QUEtiapine (SEROQUEL) 200 MG tablet Take 1 tablet by mouth 2 times daily Indications: Anxiety/depression/PTSD Yes [provider]   medical marijuana Take by mouth as needed. Yes [provider]   clonazePAM (KLONOPIN) 1 MG tablet Take 2 tablets by mouth 2 times daily. Indications: Anxiety/depression/PTSD Yes [provider]       CareTeam (Including outside providers/suppliers regularly involved in providing care):   Patient Care Team:  Evyn Kooyman, Geryl Rankins, MD as PCP - General (Family Medicine)  Tahjai Schetter, Geryl Rankins, MD as PCP - Empaneled Provider      Reviewed and updated this visit:  Tobacco  Allergies  Meds  Problems  Med Hx  Surg Hx  Soc Hx  Fam Hx           I have discussed the diagnosis with the patient and the intended plan of care as seen in the above orders. The patient has received an after-visit summary and questions were answered concerning future plans. I have discussed medication, side effects, and warnings with the patient in detail. The patient verbalized understanding and is in agreement with the plan of care. The patient will contact the office with any additional concerns.  Personalized preventative plan of care was discussed, printed and given to patient.    Zaia Carre Kathe Mariner, MD

## 2022-08-21 NOTE — Patient Instructions (Signed)
Preventing Falls: Care Instructions  Injuries and health problems such as trouble walking or poor eyesight can increase your risk of falling. So can some medicines. But there are things you can do to help prevent falls. You can exercise to get stronger. You can also arrange your home to make it safer.    Talk to your doctor about the medicines you take. Ask if any of them increase the risk of falls and whether they can be changed or stopped.   Try to exercise regularly. It can help improve your strength and balance. This can help lower your risk of falling.         Practice fall safety and prevention.   Wear low-heeled shoes that fit well and give your feet good support. Talk to your doctor if you have foot problems that make this hard.  Carry a cellphone or wear a medical alert device that you can use to call for help.  Use stepladders instead of chairs to reach high objects. Don't climb if you're at risk for falls. Ask for help, if needed.  Wear the correct eyeglasses, if you need them.        Make your home safer.   Remove rugs, cords, clutter, and furniture from walkways.  Keep your house well lit. Use night-lights in hallways and bathrooms.  Install and use sturdy handrails on stairways.  Wear nonskid footwear, even inside. Don't walk barefoot or in socks without shoes.        Be safe outside.   Use handrails, curb cuts, and ramps whenever possible.  Keep your hands free by using a shoulder bag or backpack.  Try to walk in well-lit areas. Watch out for uneven ground, changes in pavement, and debris.  Be careful in the winter. Walk on the grass or gravel when sidewalks are slippery. Use de-icer on steps and walkways. Add non-slip devices to shoes.    Put grab bars and nonskid mats in your shower or tub and near the toilet. Try to use a shower chair or bath bench when bathing.   Get into a tub or shower by putting in your weaker leg first. Get out with your strong side first. Have a phone or medical alert  device in the bathroom with you.   Where can you learn more?  Go to [employer] and enter G117 to learn more about "Preventing Falls: Care Instructions."  Current as of: July 23, 2021  Content Version: 14.1   2006-2024 [employer]wise, Incorporated.   Care instructions adapted under license by [employer]. If you have questions about a medical condition or this instruction, always ask your healthcare professional. Healthwise, Incorporated disclaims any warranty or liability for your use of this information.           Substance Use Disorder: Care Instructions  Overview     You can improve your life and health by stopping your use of alcohol or drugs. When you don't drink or use drugs, you may feel and sleep better. You may get along better with your family, friends, and coworkers. There are medicines and programs that can help with substance use disorder.  How can you care for yourself at home?  Here are some ways to help you stay sober and prevent relapse.  If you have been given medicine to help keep you sober or reduce your cravings, be sure to take it exactly as prescribed.  Talk to your doctor about programs that can help you stop using drugs or  drinking alcohol.  Do not keep alcohol or drugs in your home.  Plan ahead. Think about what you'll say if other people ask you to drink or use drugs. Try not to spend time with people who drink or use drugs.  Use the time and money spent on drinking or drugs to do something that's important to you.  Preventing a relapse  Have a plan to deal with relapse. Learn to recognize changes in your thinking that lead you to drink or use drugs. Get help before you start to drink or use drugs again.  Try to stay away from situations, friends, or places that may lead you to drink or use drugs.  If you feel the need to drink alcohol or use drugs again, seek help right away. Call a trusted friend or family member. Some people get support from organizations such as  Narcotics Anonymous or [employer] [employer] or from treatment facilities.  If you relapse, get help as soon as you can. Some people make a plan with another person that outlines what they want that person to do for them if they relapse. The plan usually includes how to handle the relapse and who to notify in case of relapse.  Don't give up. Remember that a relapse doesn't mean that you have failed. Use the experience to learn the triggers that lead you to drink or use drugs. Then quit again. Recovery is a lifelong process. Many people have several relapses before they are able to quit for good.  Follow-up care is a key part of your treatment and safety. Be sure to make and go to all appointments, and call your doctor if you are having problems. It's also a good idea to know your test results and keep a list of the medicines you take.  When should you call for help?   Call 911  anytime you think you may need emergency care. For example, call if you or someone else:   Has overdosed or has withdrawal signs. Be sure to tell the emergency workers that you are or someone else is using or trying to quit using drugs. Overdose or withdrawal signs may include:  Losing consciousness.  Seizure.  Seeing or hearing things that aren't there (hallucinations).    Is thinking or talking about suicide or harming others.   Where to get help 24 hours a day, 7 days a week   If you or someone you know talks about suicide, self-harm, a mental health crisis, a substance use crisis, or any other kind of emotional distress, get help right away. You can:   Call the Suicide and Crisis Lifeline at 36.    Call 1-800-273-TALK ((310)065-8092).    Text HOME to 208-129-7076 to access the Crisis Text Line.   Consider saving these numbers in your phone.  Go to 988lifeline.org for more information or to chat online.  Call your doctor now or seek immediate medical care if:   You are having withdrawal symptoms. These may include nausea or vomiting, sweating,  shakiness, and anxiety.   Watch closely for changes in your health, and be sure to contact your doctor if:   You have a relapse.    You need more help or support to stop.   Where can you learn more?  Go to [employer] and enter H573 to learn more about "Substance Use Disorder: Care Instructions."  Current as of: November 21, 2021  Content Version: 14.1   2006-2024 Healthwise, Incorporated.   Care instructions adapted  under license by [facility]. If you have questions about a medical condition or this instruction, always ask your healthcare professional. Healthwise, Incorporated disclaims any warranty or liability for your use of this information.           Learning About Vision Tests  What are vision tests?     The four most common vision tests are visual acuity tests, refraction, visual field tests, and color vision tests.  Visual acuity (sharpness) tests  These tests are used:  To see if you need glasses or contact lenses.  To monitor an eye problem.  To check an eye injury.  Visual acuity tests are done as part of routine exams. You may also have this test when you get your driver's license or apply for some types of jobs.  Visual field tests  These tests are used:  To check for vision loss in any area of your range of vision.  To screen for certain eye diseases.  To look for nerve damage after a stroke, head injury, or other problem that could reduce blood flow to the brain.  Refraction and color tests  A refraction test is done to find the right prescription for glasses and contact lenses.  A color vision test is done to check for color blindness.  Color vision is often tested as part of a routine exam. You may also have this test when you apply for a job where recognizing different colors is important, such as truck driving, Optician, dispensing, or the [employer].  How are vision tests done?  Visual acuity test   You cover one eye at a time.  You read aloud from a wall chart across the  room.  You read aloud from a small card that you hold in your hand.  Refraction   You look into a special device.  The device puts lenses of different strengths in front of each eye to see how strong your glasses or contact lenses need to be.  Visual field tests   Your doctor may have you look through special machines.  Or your doctor may simply have you stare straight ahead while they move a finger into and out of your field of vision.  Color vision test   You look at pieces of printed test patterns in various colors. You say what number or symbol you see.  Your doctor may have you trace the number or symbol using a pointer.  How do these tests feel?  There is very little chance of having a problem from this test. If dilating drops are used for a vision test, they may make the eyes sting and cause a medicine taste in the mouth.  Follow-up care is a key part of your treatment and safety. Be sure to make and go to all appointments, and call your doctor if you are having problems. It's also a good idea to know your test results and keep a list of the medicines you take.  Where can you learn more?  Go to [employer] and enter G551 to learn more about "Learning About Vision Tests."  Current as of: June 11, 2021  Content Version: 14.1   2006-2024 Healthwise, Incorporated.   Care instructions adapted under license by [facility]. If you have questions about a medical condition or this instruction, always ask your healthcare professional. Health[employer], Incorporated disclaims any warranty or liability for your use of this information.           Eating Healthy Foods: Care Instructions  With every meal, you can make healthy food choices. Try to eat a variety of fruits, vegetables, whole grains, lean proteins, and low-fat dairy products. This can help you get the right balance of nutrients, including vitamins and minerals. Small changes add up over time. You can start by adding one healthy food to your  meals each day.    Try to make half your plate fruits and vegetables, one-fourth whole grains, and one-fourth lean proteins. Try including dairy with your meals.   Eat more fruits and vegetables. Try to have them with most meals and snacks.   Foods for healthy eating        Fruits   These can be fresh, frozen, canned, or dried.  Try to choose whole fruit rather than fruit juice.  Eat a variety of colors.        Vegetables   These can be fresh, frozen, canned, or dried.  Beans, peas, and lentils count too.        Whole grains   Choose whole-grain breads, cereals, and noodles.  Try brown rice.        Lean proteins   These can include lean meat, poultry, fish, and eggs.  You can also have tofu, beans, peas, lentils, nuts, and seeds.        Dairy   Try milk, yogurt, and cheese.  Choose low-fat or fat-free when you can.  If you need to, use lactose-free milk or fortified plant-based milk products, such as soy milk.        Water   Drink water when you're thirsty.  Limit sugar-sweetened drinks, including soda, fruit drinks, and sports drinks.  Where can you learn more?  Go to [employer] and enter T756 to learn more about "Eating Healthy Foods: Care Instructions."  Current as of: September 26, 2021  Content Version: 14.1   2006-2024 Healthwise, Incorporated.   Care instructions adapted under license by [facility]. If you have questions about a medical condition or this instruction, always ask your healthcare professional. Healthwise, Incorporated disclaims any warranty or liability for your use of this information.           Starting a Weight Loss Plan: Care Instructions  Overview     It can be a challenge to lose weight. But your doctor can help you make a weight-loss plan that meets your needs.  You don't have to make a lot of big changes at once. A better idea might be to focus on small changes and stick with them. When those changes become habit, you can add a few more changes.  Some people  find it helpful to take an exercise or nutrition class. If you have questions, ask your doctor about seeing a registered dietitian or an exercise specialist. You might also think about joining a weight-loss support group.  If you're not ready to make changes right now, try to pick a date in the future. Then make an appointment with your doctor to talk about when and how you'll get started with a plan.  Follow-up care is a key part of your treatment and safety. Be sure to make and go to all appointments, and call your doctor if you are having problems. It's also a good idea to know your test results and keep a list of the medicines you take.  How can you care for yourself at home?  Set realistic goals. Many people expect to lose much more weight than is likely. A weight loss of 5%  to 10% of your body weight may be enough to improve your health.  Get family and friends involved to provide support. Talk to them about why you are trying to lose weight, and ask them to help. They can help by participating in exercise and having meals with you, even if they may be eating something different.  Find what works best for you. If you do not have time or do not like to cook, a program that offers meal replacement bars or shakes may be better for you. Or if you like to prepare meals, finding a plan that includes daily menus and recipes may be best.  Ask your doctor about other health professionals who can help you achieve your weight loss goals.  A dietitian can help you make healthy changes in your diet.  An exercise specialist or personal trainer can help you develop a safe and effective exercise program.  A counselor or psychiatrist can help you cope with issues such as depression, anxiety, or family problems that can make it hard to focus on weight loss.  Consider joining a support group for people who are trying to lose weight. Your doctor can suggest groups in your area.  Where can you learn more?  Go to  [employer] and enter U357 to learn more about "Starting a Weight Loss Plan: Care Instructions."  Current as of: September 26, 2021  Content Version: 14.1   2006-2024 Healthwise, Incorporated.   Care instructions adapted under license by [facility]. If you have questions about a medical condition or this instruction, always ask your healthcare professional. Healthwise, Incorporated disclaims any warranty or liability for your use of this information.           Advance Directives: Care Instructions  Overview  An advance directive is a legal way to state your wishes at the end of your life. It tells your family and your doctor what to do if you can't say what you want.  There are two main types of advance directives. You can change them any time your wishes change.  Living will.  This form tells your family and your doctor your wishes about life support and other treatment. The form is also called a declaration.  Medical power of attorney.  This form lets you name a person to make treatment decisions for you when you can't speak for yourself. This person is called a health care agent (health care proxy, health care surrogate). The form is also called a durable power of attorney for health care.  If you do not have an advance directive, decisions about your medical care may be made by a family member, or by a doctor or a judge who doesn't know you.  It may help to think of an advance directive as a gift to the people who care for you. If you have one, they won't have to make tough decisions by themselves.  For more information, including forms for your state, see the [employer] website ([employer]cy).  Follow-up care is a key part of your treatment and safety. Be sure to make and go to all appointments, and call your doctor if you are having problems. It's also a good idea to know your test results and keep a list of the medicines you take.  What should you  include in an advance directive?  Many states have a unique advance directive form. (It may ask you to address specific issues.) Or you might use a universal form that's approved by many  states.  If your form doesn't tell you what to address, it may be hard to know what to include in your advance directive. Use the questions below to help you get started.  Who do you want to make decisions about your medical care if you are not able to?  What life-support measures do you want if you have a serious illness that gets worse over time or can't be cured?  What are you most afraid of that might happen? (Maybe you're afraid of having pain, losing your independence, or being kept alive by machines.)  Where would you prefer to die? (Your home? A hospital? A nursing home?)  Do you want to donate your organs when you die?  Do you want certain religious practices performed before you die?  When should you call for help?  Be sure to contact your doctor if you have any questions.  Where can you learn more?  Go to [employer] and enter R264 to learn more about "Advance Directives: Care Instructions."  Current as of: November 22, 2021  Content Version: 14.1   2006-2024 Healthwise, Incorporated.   Care instructions adapted under license by [facility]. If you have questions about a medical condition or this instruction, always ask your healthcare professional. Healthwise, Incorporated disclaims any warranty or liability for your use of this information.           A Healthy Heart: Care Instructions  Overview     Coronary artery disease, also called heart disease, occurs when a substance called plaque builds up in the vessels that supply oxygen-rich blood to your heart muscle. This can narrow the blood vessels and reduce blood flow. A heart attack happens when blood flow is completely blocked. A high-fat diet, smoking, and other factors increase the risk of heart disease.  Your doctor has found that you have  a chance of having heart disease. A heart-healthy lifestyle can help keep your heart healthy and prevent heart disease. This lifestyle includes eating healthy, being active, staying at a weight that's healthy for you, and not smoking or using tobacco. It also includes taking medicines as directed, managing other health conditions, and trying to get a healthy amount of sleep.  Follow-up care is a key part of your treatment and safety. Be sure to make and go to all appointments, and call your doctor if you are having problems. It's also a good idea to know your test results and keep a list of the medicines you take.  How can you care for yourself at home?  Diet   Use less salt when you cook and eat. This helps lower your blood pressure. Taste food before salting. Add only a little salt when you think you need it. With time, your taste buds will adjust to less salt.    Eat fewer snack items, fast foods, canned soups, and other high-salt, high-fat, processed foods.    Read food labels and try to avoid saturated and trans fats. They increase your risk of heart disease by raising cholesterol levels.    Limit the amount of solid fat--butter, margarine, and shortening--you eat. Use olive, peanut, or canola oil when you cook. Bake, broil, and steam foods instead of frying them.    Eat a variety of fruit and vegetables every day. Dark green, deep orange, red, or yellow fruits and vegetables are especially good for you. Examples include spinach, carrots, peaches, and berries.    Foods high in fiber can reduce your cholesterol and provide important  vitamins and minerals. High-fiber foods include whole-grain cereals and breads, oatmeal, beans, brown rice, citrus fruits, and apples.    Eat lean proteins. Heart-healthy proteins include seafood, lean meats and poultry, eggs, beans, peas, nuts, seeds, and soy products.    Limit drinks and foods with added sugar. These include candy, desserts, and soda pop.   Heart-healthy  lifestyle   If your doctor recommends it, get more exercise. For many people, walking is a good choice. Or you may want to swim, bike, or do other activities. Bit by bit, increase the time you're active every day. Try for at least 30 minutes on most days of the week.    Try to quit or cut back on using tobacco and other nicotine products. This includes smoking and vaping. If you need help quitting, talk to your doctor about stop-smoking programs and medicines. These can increase your chances of quitting for good. Quitting is one of the most important things you can do to protect your heart. It is never too late to quit. Try to avoid secondhand smoke too.    Stay at a weight that's healthy for you. Talk to your doctor if you need help losing weight.    Try to get 7 to 9 hours of sleep each night.    Limit alcohol to 2 drinks a day for men and 1 drink a day for women. Too much alcohol can cause health problems.    Manage other health problems such as diabetes, high blood pressure, and high cholesterol. If you think you may have a problem with alcohol or drug use, talk to your doctor.   Medicines   Take your medicines exactly as prescribed. Call your doctor if you think you are having a problem with your medicine.    If your doctor recommends aspirin, take the amount directed each day. Make sure you take aspirin and not another kind of pain reliever, such as acetaminophen (Tylenol).   When should you call for help?   Call 911 if you have symptoms of a heart attack. These may include:   Chest pain or pressure, or a strange feeling in the chest.    Sweating.    Shortness of breath.    Pain, pressure, or a strange feeling in the back, neck, jaw, or upper belly or in one or both shoulders or arms.    Lightheadedness or sudden weakness.    A fast or irregular heartbeat.   After you call 911, the operator may tell you to chew 1 adult-strength or 2 to 4 low-dose aspirin. Wait for an ambulance. Do not try to  drive yourself.  Watch closely for changes in your health, and be sure to contact your doctor if you have any problems.  Where can you learn more?  Go to [employer] and enter F075 to learn more about "A Healthy Heart: Care Instructions."  Current as of: June 30, 2021  Content Version: 14.1   2006-2024 Healthwise, Incorporated.   Care instructions adapted under license by [facility]. If you have questions about a medical condition or this instruction, always ask your healthcare professional. Healthwise, Incorporated disclaims any warranty or liability for your use of this information.      Personalized Preventive Plan for Brandon Montoya. - 08/21/2022  Medicare offers a range of preventive health benefits. Some of the tests and screenings are paid in full while other may be subject to a deductible, co-insurance, and/or copay.    Some of these benefits  include a comprehensive review of your medical history including lifestyle, illnesses that may run in your family, and various assessments and screenings as appropriate.    After reviewing your medical record and screening and assessments performed today your provider may have ordered immunizations, labs, imaging, and/or referrals for you.  A list of these orders (if applicable) as well as your Preventive Care list are included within your After Visit Summary for your review.    Other Preventive Recommendations:    A preventive eye exam performed by an eye specialist is recommended every 1-2 years to screen for glaucoma; cataracts, macular degeneration, and other eye disorders.  A preventive dental visit is recommended every 6 months.  Try to get at least 150 minutes of exercise per week or 10,000 steps per day on a pedometer .  Order or download the FREE "Exercise & Physical Activity: Your Everyday Guide" from [employer]. Call 415-842-2006 or search [employer] online.  You need 1200-1500 mg of  calcium and 1000-2000 IU of vitamin D per day. It is possible to meet your calcium requirement with diet alone, but a vitamin D supplement is usually necessary to meet this goal.  When exposed to the sun, use a sunscreen that protects against both UVA and UVB radiation with an SPF of 30 or greater. Reapply every 2 to 3 hours or after sweating, drying off with a towel, or swimming.  Always wear a seat belt when traveling in a car. Always wear a helmet when riding a bicycle or motorcycle.

## 2022-08-26 NOTE — H&P (Signed)
Pre-Admission History and Physical    Patient: Brandon Montoya.   MRN: [id number]   SSN: [id number]   Date of Birth: [birth year]   Age: 57 y.o.   Sex: male     Patient scheduled for: Lumbar two/three fusion, extension thoracic ten-lumbar two fusion.  Date of surgery: 08/28/2022.  Surgeon: Pamalee Leyden, MD    HPI:  Brandon Montoya. is a 57 y.o. male with severe low back pain following recent fall.  He has a hx of prior thoracolumbar fusion. He reports a pain level of 10/10.  CT scan confirms a pars fracture at L2 consistent with his pain. Pain is impacting the patient's functional ability. He is being admitted for surgical intervention.         Past Medical History:   Diagnosis Date    Anxiety and depression 1994    on meds. "was in [employer]"    Arthritis 2019    hx bilateral knee surgery & revisions    Chronic pain     bilateral knees & lower back    GERD (gastroesophageal reflux disease) 2002    Pantoprazole    History of blood transfusion     in [place] with one of the knee replacement surgery    Hx of chest pain 2019    & syncope- resolved. no cardiologist    Hyperlipidemia 2019    Atorvastatin    Marijuana use     positive for Opioids 01/09/2022    OSA (obstructive sleep apnea)     couldn't tolerated CPAP.    PTSD (post-traumatic stress disorder) 1994    on meds. "was in [employer]". Saw therapist in the past     Social History     Socioeconomic History    Marital status: Married   Tobacco Use    Smoking status: Former     Types: Cigarettes, Cigars     Start date: 2000     Passive exposure: Current    Smokeless tobacco: Never   Vaping Use    Vaping status: Never Used   Substance and Sexual Activity    Alcohol use: Yes     Comment: rarely    Drug use: Yes     Types: Marijuana Sheran Fava)     Comment: medical marijuana. hold 7 days before surgery     Social Determinants of Health     Financial Resource Strain: Low Risk  (02/20/2022)    Overall Financial Resource Strain (CARDIA)     Difficulty of Paying Living  Expenses: Not hard at all   Food Insecurity: No Food Insecurity (05/20/2022)    Hunger Vital Sign     Worried About Running Out of Food in the Last Year: Never true     Ran Out of Food in the Last Year: Never true   Transportation Needs: No Transportation Needs (05/20/2022)    PRAPARE - Therapist, art (Medical): No     Lack of Transportation (Non-Medical): No   Physical Activity: Inactive (08/21/2022)    Exercise Vital Sign     Days of Exercise per Week: 0 days     Minutes of Exercise per Session: 0 min   Housing Stability: Low Risk  (05/20/2022)    Housing Stability Vital Sign     Unable to Pay for Housing in the Last Year: No     Number of Places Lived in the Last Year: 1     Unstable Housing in the Last Year:  No     Past Surgical History:   Procedure Laterality Date    BACK SURGERY  2022    fx back -hardware (rods & screws)    CERVICAL FUSION N/A 05/20/2022    ANTERIOR CERVICAL DECOMPRESSION FUSION CERVICAL SIX/SEVEN, CERVICAL SEVEN/THORACIC ONE; C-ARM; [STRYKER SPINE] performed by Pamalee Leyden, MD at [facility] MAIN OR    CHOLECYSTECTOMY, LAPAROSCOPIC  07/03/2017    COLONOSCOPY      ESOPHAGOGASTRODUODENOSCOPY  09/30/2018    KNEE ARTHROPLASTY Bilateral 2019    with revisions    MASTECTOMY Right     due to gynecomastia     No family history on file.  Allergies   Allergen Reactions    Eletriptan Anaphylaxis, Shortness Of Breath and Swelling    Indomethacin Nausea And Vomiting     No current facility-administered medications for this encounter.     Current Outpatient Medications   Medication Sig Dispense Refill    FLUoxetine (PROZAC) 20 MG capsule Take 2 capsules by mouth      atorvastatin (LIPITOR) 20 MG tablet Take 1 tablet by mouth daily Indications: high cholesterol      pantoprazole (PROTONIX) 40 MG tablet Take 1 tablet by mouth daily Indications: GERD      QUEtiapine (SEROQUEL) 200 MG tablet Take 1 tablet by mouth 2 times daily Indications: Anxiety/depression/PTSD      medical marijuana Take  by mouth as needed.      clonazePAM (KLONOPIN) 1 MG tablet Take 2 tablets by mouth 2 times daily. Indications: Anxiety/depression/PTSD         ROS:  Denies chills, fever,night sweats,  bowel or bladder dysfunction, unexplained weight loss/weight gain, chest pain, sob or anxiety.    Physical Examination    Gen: Well developed, well nourished 57 y.o. male Severe mechanical pain in his back. Normal strength BL quad, hamstrings, tib ant, EHL. Neurologically intact. Severe antalgic gait.     Assessment and Plan    Due to the pt's persistent and debilitating symptoms Brandon Freeh. is being admitted to undergo surgical intervention. The post-operative plan of care consists of physical therapy/home health if inidcated and a 2 week f/u office visit.  The risks, benefits, complications and alternatives to surgery have been discussed in detail with the patient.  The patient understands and agrees to proceed.

## 2022-08-28 ENCOUNTER — Inpatient Hospital Stay: Admit: 2022-08-28 | Payer: MEDICARE | Primary: Family Medicine

## 2022-08-28 ENCOUNTER — Inpatient Hospital Stay
Admit: 2022-08-28 | Discharge: 2022-08-30 | Disposition: A | Discharge status: Home / Self Care | Payer: MEDICARE | Attending: Orthopaedic Surgery | Admitting: Orthopaedic Surgery

## 2022-08-28 DIAGNOSIS — S32029A Unspecified fracture of second lumbar vertebra, initial encounter for closed fracture: Principal | ICD-10-CM

## 2022-08-28 DIAGNOSIS — Z981 Arthrodesis status: Secondary | ICD-10-CM

## 2022-08-28 LAB — URINE DRUG SCREEN
Amphetamine, Urine: NEGATIVE
Barbiturates, Urine: NEGATIVE
Benzodiazepines, Urine: NEGATIVE
Cocaine, Urine: NEGATIVE
Methadone, Urine: NEGATIVE
Opiates, Urine: NEGATIVE
Phencyclidine, Urine: NEGATIVE
THC, TH-Cannabinol, Urine: POSITIVE — AB

## 2022-08-28 LAB — TYPE AND SCREEN
ABO/Rh: B POS
Antibody Screen: NEGATIVE

## 2022-08-28 LAB — POCT GLUCOSE: POC Glucose: 102 mg/dL (ref 70–110)

## 2022-08-28 MED ORDER — SODIUM CHLORIDE 0.45 % IV SOLN
0.45 | INTRAVENOUS | Status: DC
Start: 2022-08-28 — End: 2022-08-30
  Administered 2022-08-28 – 2022-08-30 (×4): via INTRAVENOUS

## 2022-08-28 MED ORDER — LABETALOL HCL 5 MG/ML IV SOLN
5 | INTRAVENOUS | Status: DC | PRN
Start: 2022-08-28 — End: 2022-08-28

## 2022-08-28 MED ORDER — DIPHENHYDRAMINE HCL 50 MG/ML IJ SOLN
50 | Freq: Four times a day (QID) | INTRAMUSCULAR | Status: DC | PRN
Start: 2022-08-28 — End: 2022-08-29
  Administered 2022-08-29 (×3): 25 mg via INTRAVENOUS

## 2022-08-28 MED ORDER — MEPERIDINE HCL 50 MG/ML IJ SOLN
50 | INTRAMUSCULAR | Status: DC | PRN
Start: 2022-08-28 — End: 2022-08-28

## 2022-08-28 MED ORDER — MIDAZOLAM HCL 2 MG/2ML IJ SOLN
2 | INTRAMUSCULAR | Status: DC | PRN
  Administered 2022-08-28: 14:00:00 2 via INTRAVENOUS

## 2022-08-28 MED ORDER — QUETIAPINE FUMARATE 200 MG PO TABS
200 | Freq: Every evening | ORAL | Status: DC
Start: 2022-08-28 — End: 2022-08-30
  Administered 2022-08-29 – 2022-08-30 (×3): 200 mg via ORAL

## 2022-08-28 MED ORDER — SUGAMMADEX SODIUM 200 MG/2ML IV SOLN
200 | INTRAVENOUS | Status: AC
Start: 2022-08-28 — End: ?

## 2022-08-28 MED ORDER — OXYCODONE HCL 5 MG PO TABS
5 | Freq: Once | ORAL | Status: DC | PRN
Start: 2022-08-28 — End: 2022-08-28

## 2022-08-28 MED ORDER — KETAMINE HCL 50 MG/5ML IJ SOSY
50 | INTRAMUSCULAR | Status: DC | PRN
  Administered 2022-08-28: 14:00:00 50 via INTRAVENOUS

## 2022-08-28 MED ORDER — ONDANSETRON HCL 4 MG/2ML IJ SOLN
4 | Freq: Four times a day (QID) | INTRAMUSCULAR | Status: DC | PRN
Start: 2022-08-28 — End: 2022-08-30

## 2022-08-28 MED ORDER — DEXAMETHASONE SODIUM PHOSPHATE 4 MG/ML IJ SOLN
4 | INTRAMUSCULAR | Status: DC | PRN
  Administered 2022-08-28: 14:00:00 4 via INTRAVENOUS

## 2022-08-28 MED ORDER — DROPERIDOL 2.5 MG/ML IJ SOLN
2.5 | Freq: Once | INTRAMUSCULAR | Status: DC | PRN
Start: 2022-08-28 — End: 2022-08-28

## 2022-08-28 MED ORDER — ONDANSETRON HCL 4 MG/2ML IJ SOLN
4 | INTRAMUSCULAR | Status: AC
Start: 2022-08-28 — End: ?

## 2022-08-28 MED ORDER — SODIUM CHLORIDE (PF) 0.9 % IJ SOLN
0.9 | Freq: Two times a day (BID) | INTRAMUSCULAR | Status: DC
Start: 2022-08-28 — End: 2022-08-30

## 2022-08-28 MED ORDER — ONDANSETRON HCL 4 MG/2ML IJ SOLN
4 | Freq: Once | INTRAMUSCULAR | Status: DC | PRN
Start: 2022-08-28 — End: 2022-08-28

## 2022-08-28 MED ORDER — IPRATROPIUM-ALBUTEROL 0.5-2.5 (3) MG/3ML IN SOLN
Freq: Once | RESPIRATORY_TRACT | Status: DC | PRN
Start: 2022-08-28 — End: 2022-08-28

## 2022-08-28 MED ORDER — TRANEXAMIC ACID-NACL 1000-0.7 MG/100ML-% IV SOLN
INTRAVENOUS | Status: DC | PRN
  Administered 2022-08-28 (×2): 1 via INTRAVENOUS

## 2022-08-28 MED ORDER — HYDROMORPHONE HCL PF 1 MG/ML IJ SOLN
1 | INTRAMUSCULAR | Status: AC | PRN
Start: 2022-08-28 — End: 2022-08-28
  Administered 2022-08-28 (×4): 0.5 mg via INTRAVENOUS

## 2022-08-28 MED ORDER — ROCURONIUM BROMIDE 50 MG/5ML IV SOLN
50 | INTRAVENOUS | Status: AC
Start: 2022-08-28 — End: ?

## 2022-08-28 MED ORDER — PROPOFOL 200 MG/20ML IV EMUL
200 | INTRAVENOUS | Status: AC
Start: 2022-08-28 — End: ?

## 2022-08-28 MED ORDER — PHENYLEPHRINE HCL (PRESSORS) 10 MG/ML IV SOLN
10 | INTRAVENOUS | Status: DC | PRN
  Administered 2022-08-28 (×2): 100 via INTRAVENOUS
  Administered 2022-08-28 (×2): 200 via INTRAVENOUS
  Administered 2022-08-28 (×6): 100 via INTRAVENOUS
  Administered 2022-08-28: 14:00:00 200 via INTRAVENOUS

## 2022-08-28 MED ORDER — HYDROMORPHONE HCL 1 MG/ML IJ SOLN
1 | INTRAMUSCULAR | Status: AC
Start: 2022-08-28 — End: ?

## 2022-08-28 MED ORDER — EPHEDRINE SULFATE (PRESSORS) 5 MG/ML IV SOLN
5 | INTRAVENOUS | Status: AC
Start: 2022-08-28 — End: ?

## 2022-08-28 MED ORDER — LIDOCAINE HCL (PF) 2 % IJ SOLN
2 | INTRAMUSCULAR | Status: DC | PRN
  Administered 2022-08-28: 14:00:00 80 via INTRAVENOUS

## 2022-08-28 MED ORDER — SODIUM CHLORIDE 0.9 % IV SOLN
0.9 | INTRAVENOUS | Status: DC | PRN
Start: 2022-08-28 — End: 2022-08-28

## 2022-08-28 MED ORDER — POLYETHYLENE GLYCOL 3350 17 G PO PACK
17 | Freq: Every day | ORAL | Status: DC
Start: 2022-08-28 — End: 2022-08-30

## 2022-08-28 MED ORDER — BISACODYL 5 MG PO TBEC
5 | Freq: Every day | ORAL | Status: DC
Start: 2022-08-28 — End: 2022-08-30
  Administered 2022-08-28 – 2022-08-30 (×3): 5 mg via ORAL

## 2022-08-28 MED ORDER — OXYCODONE HCL 5 MG PO TABS
5 | ORAL_TABLET | Freq: Four times a day (QID) | ORAL | 0 refills | Status: DC | PRN
Start: 2022-08-28 — End: 2022-08-30

## 2022-08-28 MED ORDER — ROCURONIUM BROMIDE 50 MG/5ML IV SOLN
50 | INTRAVENOUS | Status: DC | PRN
  Administered 2022-08-28: 15:00:00 20 via INTRAVENOUS
  Administered 2022-08-28: 14:00:00 50 via INTRAVENOUS

## 2022-08-28 MED ORDER — OXYCODONE HCL 5 MG PO TABS
5 | ORAL | Status: DC | PRN
Start: 2022-08-28 — End: 2022-08-30
  Administered 2022-08-28 – 2022-08-29 (×5): 10 mg via ORAL

## 2022-08-28 MED ORDER — OXYCODONE HCL 5 MG PO TABS
5 | ORAL | Status: DC | PRN
Start: 2022-08-28 — End: 2022-08-30

## 2022-08-28 MED ORDER — DIPHENHYDRAMINE HCL 25 MG PO CAPS
25 | Freq: Four times a day (QID) | ORAL | Status: DC | PRN
Start: 2022-08-28 — End: 2022-08-29

## 2022-08-28 MED ORDER — NALOXONE HCL 0.4 MG/ML IJ SOLN
0.4 | INTRAMUSCULAR | Status: DC | PRN
Start: 2022-08-28 — End: 2022-08-28

## 2022-08-28 MED ORDER — LACTATED RINGERS IV SOLN
INTRAVENOUS | Status: AC
Start: 2022-08-28 — End: 2022-08-30
  Administered 2022-08-28 (×3): via INTRAVENOUS

## 2022-08-28 MED ORDER — LIDOCAINE HCL (PF) 2 % IJ SOLN
2 | INTRAMUSCULAR | Status: AC
Start: 2022-08-28 — End: ?

## 2022-08-28 MED ORDER — NORMAL SALINE FLUSH 0.9 % IV SOLN
0.9 | Freq: Two times a day (BID) | INTRAVENOUS | Status: DC
Start: 2022-08-28 — End: 2022-08-30

## 2022-08-28 MED ORDER — MIDAZOLAM HCL 2 MG/2ML IJ SOLN
2 | INTRAMUSCULAR | Status: AC
Start: 2022-08-28 — End: ?

## 2022-08-28 MED ORDER — ONDANSETRON HCL 4 MG/2ML IJ SOLN
4 | INTRAMUSCULAR | Status: DC | PRN
  Administered 2022-08-28: 14:00:00 4 via INTRAVENOUS

## 2022-08-28 MED ORDER — ONDANSETRON 4 MG PO TBDP
4 | Freq: Three times a day (TID) | ORAL | Status: DC | PRN
Start: 2022-08-28 — End: 2022-08-30

## 2022-08-28 MED ORDER — VANCOMYCIN HCL 1 G IV SOLR
1 | INTRAVENOUS | Status: AC
Start: 2022-08-28 — End: ?

## 2022-08-28 MED ORDER — TRANEXAMIC ACID-NACL 1000-0.7 MG/100ML-% IV SOLN
INTRAVENOUS | Status: AC
Start: 2022-08-28 — End: ?

## 2022-08-28 MED ORDER — KETAMINE HCL 50 MG/5ML IJ SOSY
50 | INTRAMUSCULAR | Status: AC
Start: 2022-08-28 — End: ?

## 2022-08-28 MED ORDER — ACETAMINOPHEN 500 MG PO TABS
500 | Freq: Once | ORAL | Status: AC
Start: 2022-08-28 — End: 2022-08-28
  Administered 2022-08-28: 11:00:00 1000 mg via ORAL

## 2022-08-28 MED ORDER — BUPIVACAINE HCL (PF) 0.25 % IJ SOLN
0.25 | INTRAMUSCULAR | Status: AC
Start: 2022-08-28 — End: ?

## 2022-08-28 MED ORDER — DIPHENHYDRAMINE HCL 50 MG/ML IJ SOLN
50 | Freq: Once | INTRAMUSCULAR | Status: AC | PRN
Start: 2022-08-28 — End: 2022-08-28
  Administered 2022-08-28: 18:00:00 12.5 mg via INTRAVENOUS

## 2022-08-28 MED ORDER — FENTANYL CITRATE (PF) 100 MCG/2ML IJ SOLN
100 | INTRAMUSCULAR | Status: AC | PRN
Start: 2022-08-28 — End: 2022-08-28
  Administered 2022-08-28 (×4): 25 ug via INTRAVENOUS

## 2022-08-28 MED ORDER — EPHEDRINE SULFATE (PRESSORS) 5 MG/ML IV SOLN
5 | INTRAVENOUS | Status: DC | PRN
  Administered 2022-08-28 (×3): 5 via INTRAVENOUS

## 2022-08-28 MED ORDER — TRANEXAMIC ACID-NACL 1000-0.7 MG/100ML-% IV SOLN
Freq: Once | INTRAVENOUS | Status: DC
Start: 2022-08-28 — End: 2022-08-30

## 2022-08-28 MED ORDER — THROMBIN 5000 UNITS EX SOLR
5000 | CUTANEOUS | Status: AC
Start: 2022-08-28 — End: ?

## 2022-08-28 MED ORDER — GLYCOPYRROLATE 0.4 MG/2ML IJ SOLN
0.4 | INTRAMUSCULAR | Status: AC
Start: 2022-08-28 — End: ?

## 2022-08-28 MED ORDER — STERILE WATER FOR INJECTION (MIXTURES ONLY)
2 | Status: AC
Start: 2022-08-28 — End: 2022-08-28
  Administered 2022-08-28: 14:00:00 2000 mg via INTRAVENOUS

## 2022-08-28 MED ORDER — METAXALONE 800 MG PO TABS
800 | Freq: Three times a day (TID) | ORAL | Status: DC | PRN
Start: 2022-08-28 — End: 2022-08-30

## 2022-08-28 MED ORDER — BUPIVACAINE-EPINEPHRINE (PF) 0.25% -1:200000 IJ SOLN
INTRAMUSCULAR | Status: DC | PRN
Start: 2022-08-28 — End: 2022-08-28
  Administered 2022-08-28: 14:00:00 30 via INTRAMUSCULAR

## 2022-08-28 MED ORDER — HYDROMORPHONE HCL PF 1 MG/ML IJ SOLN
1 | INTRAMUSCULAR | Status: DC | PRN
Start: 2022-08-28 — End: 2022-08-30
  Administered 2022-08-29 (×2): 0.5 mg via INTRAVENOUS

## 2022-08-28 MED ORDER — GLYCOPYRROLATE 0.4 MG/2ML IJ SOLN
0.4 | INTRAMUSCULAR | Status: DC | PRN
  Administered 2022-08-28: 14:00:00 .2 via INTRAVENOUS

## 2022-08-28 MED ORDER — FLUOXETINE HCL 20 MG PO CAPS
20 | Freq: Every day | ORAL | Status: DC
Start: 2022-08-28 — End: 2022-08-30
  Administered 2022-08-28 – 2022-08-30 (×3): 40 mg via ORAL

## 2022-08-28 MED ORDER — HYDROMORPHONE HCL 1 MG/ML IJ SOLN
1 | INTRAMUSCULAR | Status: DC | PRN
  Administered 2022-08-28: 14:00:00 1 via INTRAVENOUS

## 2022-08-28 MED ORDER — NORMAL SALINE FLUSH 0.9 % IV SOLN
0.9 | INTRAVENOUS | Status: DC | PRN
Start: 2022-08-28 — End: 2022-08-28

## 2022-08-28 MED ORDER — HYDROMORPHONE HCL PF 1 MG/ML IJ SOLN
1 | INTRAMUSCULAR | Status: DC | PRN
Start: 2022-08-28 — End: 2022-08-30

## 2022-08-28 MED ORDER — PANTOPRAZOLE SODIUM 40 MG PO TBEC
40 | Freq: Every day | ORAL | Status: DC
Start: 2022-08-28 — End: 2022-08-30
  Administered 2022-08-29 – 2022-08-30 (×2): 40 mg via ORAL

## 2022-08-28 MED ORDER — SUGAMMADEX SODIUM 200 MG/2ML IV SOLN
200 | INTRAVENOUS | Status: DC | PRN
  Administered 2022-08-28: 16:00:00 200 via INTRAVENOUS

## 2022-08-28 MED ORDER — THROMBIN 5000 UNITS EX SOLR
5000 | CUTANEOUS | Status: DC | PRN
Start: 2022-08-28 — End: 2022-08-28
  Administered 2022-08-28: 14:00:00 1000 via TOPICAL

## 2022-08-28 MED ORDER — PROPOFOL 200 MG/20ML IV EMUL
200 | INTRAVENOUS | Status: DC | PRN
  Administered 2022-08-28: 14:00:00 200 via INTRAVENOUS
  Administered 2022-08-28: 15:00:00 50 via INTRAVENOUS

## 2022-08-28 MED ORDER — NORMAL SALINE FLUSH 0.9 % IV SOLN
0.9 | Freq: Two times a day (BID) | INTRAVENOUS | Status: DC
Start: 2022-08-28 — End: 2022-08-28

## 2022-08-28 MED ORDER — PHENYLEPHRINE HCL (PRESSORS) 10 MG/ML IV SOLN
10 | INTRAVENOUS | Status: AC
Start: 2022-08-28 — End: ?

## 2022-08-28 MED ORDER — VANCOMYCIN HCL 1 G IV SOLR
1 | INTRAVENOUS | Status: DC | PRN
Start: 2022-08-28 — End: 2022-08-28
  Administered 2022-08-28: 14:00:00 1000 via TOPICAL

## 2022-08-28 MED ORDER — CLONAZEPAM 0.5 MG PO TABS
0.5 | Freq: Two times a day (BID) | ORAL | Status: DC
Start: 2022-08-28 — End: 2022-08-30
  Administered 2022-08-28 – 2022-08-30 (×5): 2 mg via ORAL

## 2022-08-28 MED ORDER — TRANEXAMIC ACID-NACL 1000-0.7 MG/100ML-% IV SOLN
Freq: Once | INTRAVENOUS | Status: AC
Start: 2022-08-28 — End: 2022-08-29
  Administered 2022-08-29: 11:00:00 1000 mg via INTRAVENOUS

## 2022-08-28 MED ORDER — HYDROMORPHONE HCL PF 1 MG/ML IJ SOLN
1 | INTRAMUSCULAR | Status: AC | PRN
Start: 2022-08-28 — End: 2022-08-28
  Administered 2022-08-28 (×2): 0.5 mg via INTRAVENOUS

## 2022-08-28 MED ORDER — HYDROMORPHONE HCL PF 1 MG/ML IJ SOLN
1 | INTRAMUSCULAR | Status: DC | PRN
Start: 2022-08-28 — End: 2022-08-28

## 2022-08-28 MED ORDER — STERILE WATER FOR INJECTION (MIXTURES ONLY)
2 | Freq: Three times a day (TID) | Status: AC
Start: 2022-08-28 — End: 2022-08-29
  Administered 2022-08-28 – 2022-08-29 (×2): 2000 mg via INTRAVENOUS

## 2022-08-28 MED ORDER — KETOROLAC TROMETHAMINE 30 MG/ML IJ SOLN
30 | INTRAMUSCULAR | Status: AC
Start: 2022-08-28 — End: ?

## 2022-08-28 MED ORDER — LACTATED RINGERS IV SOLN
INTRAVENOUS | Status: DC
Start: 2022-08-28 — End: 2022-08-28

## 2022-08-28 MED ORDER — FAMOTIDINE 20 MG PO TABS
20 | Freq: Two times a day (BID) | ORAL | Status: DC
Start: 2022-08-28 — End: 2022-08-30
  Administered 2022-08-28 – 2022-08-30 (×5): 20 mg via ORAL

## 2022-08-28 MED ORDER — ACETAMINOPHEN 325 MG PO TABS
325 | Freq: Four times a day (QID) | ORAL | Status: DC
Start: 2022-08-28 — End: 2022-08-30
  Administered 2022-08-28 – 2022-08-30 (×8): 650 mg via ORAL

## 2022-08-28 MED ORDER — EPINEPHRINE PF 1 MG/ML IJ SOLN
1 | INTRAMUSCULAR | Status: AC
Start: 2022-08-28 — End: ?

## 2022-08-28 MED ORDER — DEXAMETHASONE SODIUM PHOSPHATE 4 MG/ML IJ SOLN
4 | INTRAMUSCULAR | Status: AC
Start: 2022-08-28 — End: ?

## 2022-08-28 MED FILL — BRIDION 200 MG/2ML IV SOLN: 200 MG/2ML | INTRAVENOUS | Qty: 2

## 2022-08-28 MED FILL — BD POSIFLUSH 0.9 % IV SOLN: 0.9 % | INTRAVENOUS | Qty: 40

## 2022-08-28 MED FILL — TRANEXAMIC ACID-NACL 1000-0.7 MG/100ML-% IV SOLN: INTRAVENOUS | Qty: 200

## 2022-08-28 MED FILL — BUPIVACAINE HCL (PF) 0.25 % IJ SOLN: 0.25 % | INTRAMUSCULAR | Qty: 30

## 2022-08-28 MED FILL — ACETAMINOPHEN 325 MG PO TABS: 325 MG | ORAL | Qty: 2

## 2022-08-28 MED FILL — DEXAMETHASONE SODIUM PHOSPHATE 4 MG/ML IJ SOLN: 4 MG/ML | INTRAMUSCULAR | Qty: 1

## 2022-08-28 MED FILL — EPINEPHRINE PF 1 MG/ML IJ SOLN: 1 MG/ML | INTRAMUSCULAR | Qty: 1

## 2022-08-28 MED FILL — VANCOMYCIN HCL 1 G IV SOLR: 1 g | INTRAVENOUS | Qty: 1000

## 2022-08-28 MED FILL — GLYCOPYRROLATE 0.4 MG/2ML IJ SOLN: 0.4 MG/2ML | INTRAMUSCULAR | Qty: 2

## 2022-08-28 MED FILL — FAMOTIDINE 20 MG PO TABS: 20 MG | ORAL | Qty: 1

## 2022-08-28 MED FILL — ROCURONIUM BROMIDE 50 MG/5ML IV SOLN: 50 MG/5ML | INTRAVENOUS | Qty: 5

## 2022-08-28 MED FILL — FLUOXETINE HCL 20 MG PO CAPS: 20 MG | ORAL | Qty: 2

## 2022-08-28 MED FILL — XYLOCAINE-MPF 2 % IJ SOLN: 2 % | INTRAMUSCULAR | Qty: 5

## 2022-08-28 MED FILL — BISACODYL EC 5 MG PO TBEC: 5 MG | ORAL | Qty: 1

## 2022-08-28 MED FILL — LACTATED RINGERS IV SOLN: INTRAVENOUS | Qty: 1000

## 2022-08-28 MED FILL — OXYCODONE HCL 5 MG PO TABS: 5 MG | ORAL | Qty: 2

## 2022-08-28 MED FILL — HYDROMORPHONE HCL 1 MG/ML IJ SOLN: 1 MG/ML | INTRAMUSCULAR | Qty: 1

## 2022-08-28 MED FILL — PHENYLEPHRINE HCL (PRESSORS) 10 MG/ML IV SOLN: 10 MG/ML | INTRAVENOUS | Qty: 1

## 2022-08-28 MED FILL — KETOROLAC TROMETHAMINE 30 MG/ML IJ SOLN: 30 MG/ML | INTRAMUSCULAR | Qty: 1

## 2022-08-28 MED FILL — KETAMINE HCL 50 MG/5ML IJ SOSY: 50 MG/5ML | INTRAMUSCULAR | Qty: 5

## 2022-08-28 MED FILL — THROMBIN-JMI 5000 UNITS EX SOLR: 5000 units | CUTANEOUS | Qty: 5000

## 2022-08-28 MED FILL — DIPHENHYDRAMINE HCL 50 MG/ML IJ SOLN: 50 MG/ML | INTRAMUSCULAR | Qty: 1

## 2022-08-28 MED FILL — PROPOFOL 200 MG/20ML IV EMUL: 200 MG/20ML | INTRAVENOUS | Qty: 20

## 2022-08-28 MED FILL — FENTANYL CITRATE (PF) 100 MCG/2ML IJ SOLN: 100 MCG/2ML | INTRAMUSCULAR | Qty: 2

## 2022-08-28 MED FILL — ONDANSETRON HCL 4 MG/2ML IJ SOLN: 4 MG/2ML | INTRAMUSCULAR | Qty: 2

## 2022-08-28 MED FILL — POLYETHYLENE GLYCOL 3350 17 G PO PACK: 17 g | ORAL | Qty: 1

## 2022-08-28 MED FILL — CLONAZEPAM 0.5 MG PO TABS: 0.5 MG | ORAL | Qty: 4

## 2022-08-28 MED FILL — CEFAZOLIN SODIUM 2 G IV SOLR: 2 g | INTRAVENOUS | Qty: 2000

## 2022-08-28 MED FILL — SODIUM CHLORIDE 0.45 % IV SOLN: 0.45 % | INTRAVENOUS | Qty: 1000

## 2022-08-28 MED FILL — ACETAMINOPHEN 500 MG PO TABS: 500 MG | ORAL | Qty: 2

## 2022-08-28 MED FILL — MIDAZOLAM HCL 2 MG/2ML IJ SOLN: 2 MG/ML | INTRAMUSCULAR | Qty: 2

## 2022-08-28 MED FILL — EMERPHED 5 MG/ML IV SOLN: 5 MG/ML | INTRAVENOUS | Qty: 10

## 2022-08-28 NOTE — Other (Signed)
TRANSFER - OUT REPORT:    Verbal report given to Zella Ball, RN on Jesus Genera.  being transferred to [address] [place] for routine progression of patient care       Report consisted of patient's Situation, Background, Assessment and   Recommendations(SBAR).     Information from the following report(s) Adult Overview, Surgery Report, Intake/Output, and MAR was reviewed with the receiving nurse.           Lines:   Peripheral IV 08/28/22 Posterior;Right Hand (Active)   Site Assessment Clean, dry & intact 08/28/22 1343   Line Status Infusing 08/28/22 1343   Line Care Connections checked and tightened 08/28/22 1343   Phlebitis Assessment No symptoms 08/28/22 1343   Infiltration Assessment 0 08/28/22 1343   Dressing Status Clean, dry & intact 08/28/22 1343   Dressing Type Transparent 08/28/22 1343   Dressing Intervention New 08/28/22 0742        Opportunity for questions and clarification was provided.      Patient transported with:  Registered Nurse

## 2022-08-28 NOTE — Progress Notes (Signed)
Received client from PACU in satisfactory condition. Client is a pt of Dr. Carney Living. Pt had a lumbar two/three fusion extension thorasic ten-lumbar two fusion with c-arm today. Client is A/O X 4. Client is calm and cooperative. Pt denies numbness or tingling to all extremities. Palpated Radial,Posterior Tibial and Dorsalis Pedis pulses equal in rate and rhythm bilaterally-+2. Capillary Refill less than 3 seconds. Skin is warm , dry and skin color is appropriate to race. Client is negative for JVD.  Bibasilar breath sounds clear bilaterally. No use of accessory muscles. Bowel sounds hypoactive to all 4 quadrants. Abdomen is soft and non-tender. Client has a foley catheter post-operatively. Foley is patent and draining. No bladder distention evident. No complaints of bladder discomfort. Client has a honeycomb dressing to the low-mid back. Dressing is clean, dry, and intact. JP drain is present and compressed to bulb suction- bloody drainage. No other skin integrity issues present. Sequential compression device applied. Client has 20 gauge PIV present in right hand and running 1/2NS @75ml /hr. Clients pain is 5/10 on 0-10 scale. Waiting on pharmacy to verify medications. Client oriented to call bell use as well as bed use. Client oriented to phone and how to order meals. Call bell within reach. Bed in low position. Three side rails up. Dual skin assessment performed with Charlett Blake, RN. No skin breakdown noted at this time.     1655  Foley removed- catheter intact, Pt tolerated well.

## 2022-08-28 NOTE — Other (Signed)
TRANSFER - IN REPORT:    Verbal report received from ORN & CRNA on Brandon Montoya.  being received from OR for routine progression of patient care      Report consisted of patient's Situation, Background, Assessment and   Recommendations(SBAR).     Information from the following report(s) Adult Overview, Surgery Report, Intake/Output, and MAR was reviewed with the receiving nurse.    Opportunity for questions and clarification was provided.      Assessment completed upon patient's arrival to unit and care assumed.

## 2022-08-28 NOTE — Anesthesia Pre-Procedure Evaluation (Addendum)
Department of Anesthesiology  Preprocedure Note       Name:  Brandon Montoya.   Age:  57 y.o.  DOB:  December 08, 1965                                          MRN:  [id number]         Date:  08/27/2022      Surgeon: Moishe Spice):  Pamalee Leyden, MD    Procedure: Procedure(s):  LUMBAR TWO/THREE FUSION EXTENSION THORACIC TEN- LUMBAR TWO FUSION WITH C-ARM **INPATIENT STATUS DUE TO MEDICARE**    Medications prior to admission:   Prior to Admission medications    Medication Sig Start Date End Date Taking? Authorizing Provider   FLUoxetine (PROZAC) 20 MG capsule Take 2 capsules by mouth 03/05/18   [provider]   atorvastatin (LIPITOR) 20 MG tablet Take 1 tablet by mouth daily Indications: high cholesterol    [provider]   pantoprazole (PROTONIX) 40 MG tablet Take 1 tablet by mouth daily Indications: GERD    [provider]   QUEtiapine (SEROQUEL) 200 MG tablet Take 1 tablet by mouth 2 times daily Indications: Anxiety/depression/PTSD    [provider]   medical marijuana Take by mouth as needed.    [provider]   clonazePAM (KLONOPIN) 1 MG tablet Take 2 tablets by mouth 2 times daily. Indications: Anxiety/depression/PTSD    [provider]       Current medications:    No current facility-administered medications for this encounter.     Current Outpatient Medications   Medication Sig Dispense Refill    FLUoxetine (PROZAC) 20 MG capsule Take 2 capsules by mouth      atorvastatin (LIPITOR) 20 MG tablet Take 1 tablet by mouth daily Indications: high cholesterol      pantoprazole (PROTONIX) 40 MG tablet Take 1 tablet by mouth daily Indications: GERD      QUEtiapine (SEROQUEL) 200 MG tablet Take 1 tablet by mouth 2 times daily Indications: Anxiety/depression/PTSD      medical marijuana Take by mouth as needed.      clonazePAM (KLONOPIN) 1 MG tablet Take 2 tablets by mouth 2 times daily. 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    Bipolar 1 disorder (HCC) F31.9    Breast pain N64.4    Closed fracture of metacarpal bone S62.309A    Constipation K59.00    Enlarged prostate N40.0    Enthesopathy of hip region M76.899    Gallstone pancreatitis K85.10    Gastroesophageal reflux disease K21.9    History of arthroplasty of left knee X91.478    Hyperlipidemia E78.5    Hypertrophy of breast N62    Insomnia G47.00    Osteoarthritis of knee M17.9    Loss of consciousness (HCC) R40.20  Chronic low back pain M54.50, G89.29    Obesity with body mass index 30 or greater E66.9    Old anterior cruciate ligament disruption M23.50    Panic disorder without agoraphobia F41.0    Plantar fasciitis M72.2    Post-traumatic stress disorder, unspecified F43.10    Recurrent major depression (HCC) F33.9    Sleep apnea, unspecified G47.30    Vitamin D deficiency E55.9    Artificial knee joint present Z96.659    Cervicalgia M54.2    Cocaine use F14.90    Elbow pain M25.529    Erectile dysfunction N52.9    History of colonic polyps Z86.010    Hypotension I95.9    Prediabetes R73.03    Cervical radiculopathy due to osteoarthritis of spine M47.22    Major depressive disorder, 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 "was in [employer]"    Arthritis 2019    hx bilateral knee surgery & revisions    Chronic pain     bilateral knees & lower back    GERD  (gastroesophageal reflux disease) 2002    Pantoprazole    History of blood transfusion     in [place] with one of the knee replacement surgery    Hx of chest pain 2019    & syncope- resolved. no cardiologist    Hyperlipidemia 2019    Atorvastatin    Marijuana use     positive for Opioids 01/09/2022    OSA (obstructive sleep apnea)     couldn't tolerated CPAP.    PTSD (post-traumatic stress disorder) 1994    on meds. "was in [employer]". Saw therapist in the past       Past Surgical History:        Procedure Laterality Date    BACK SURGERY  2022    fx back -hardware (rods & screws)    CERVICAL FUSION N/A 05/20/2022    ANTERIOR CERVICAL DECOMPRESSION FUSION CERVICAL SIX/SEVEN, CERVICAL SEVEN/THORACIC ONE; C-ARMVictorino Dike SPINE] performed by Pamalee Leyden, MD at [facility] MAIN OR    CHOLECYSTECTOMY, LAPAROSCOPIC  07/03/2017    COLONOSCOPY      ESOPHAGOGASTRODUODENOSCOPY  09/30/2018    KNEE ARTHROPLASTY Bilateral 2019    with revisions    MASTECTOMY Right     due to gynecomastia       Social History:    Social History     Tobacco Use    Smoking status: Former     Types: Cigarettes, Cigars     Start date: 2000     Passive exposure: Current    Smokeless tobacco: Never   Substance Use Topics    Alcohol use: Yes     Comment: rarely                                Counseling given: Not Answered      Vital Signs (Current): There were no vitals filed for this visit.                                           BP Readings from  Last 3 Encounters:   08/21/22 109/72   05/21/22 107/70   02/20/22 110/81       NPO Status:                                                                                 BMI:   Wt Readings from Last 3 Encounters:   08/21/22 106.6 kg (235 lb)   08/15/22 112.5 kg (248 lb)   05/20/22 112.5 kg (248 lb)     There is no height or weight on file to calculate BMI.    CBC:   Lab Results   Component Value Date/Time    WBC 4.0 08/12/2022 09:45 AM    RBC 4.40 08/12/2022 09:45 AM    RBC 4.52 02/20/2022 02:47 PM    HGB  14.4 08/12/2022 09:45 AM    HCT 42.6 08/12/2022 09:45 AM    MCV 96.8 08/12/2022 09:45 AM    RDW 12.2 08/12/2022 09:45 AM    PLT 150 08/12/2022 09:45 AM       CMP:   Lab Results   Component Value Date/Time    NA 140 08/12/2022 09:45 AM    K 4.2 08/12/2022 09:45 AM    CL 107 08/12/2022 09:45 AM    CO2 31 08/12/2022 09:45 AM    BUN 9 08/12/2022 09:45 AM    CREATININE 0.94 08/12/2022 09:45 AM    AGRATIO 1.3 12/18/2021 10:25 AM    LABGLOM >90 08/12/2022 09:45 AM    LABGLOM >60.0 02/20/2022 02:47 PM    GLUCOSE 100 08/12/2022 09:45 AM    GLUCOSE 89 02/20/2022 02:47 PM    CALCIUM 9.4 08/12/2022 09:45 AM    BILITOT 0.4 08/12/2022 09:45 AM    ALKPHOS 102 08/12/2022 09:45 AM    ALKPHOS 112 12/18/2021 10:25 AM    AST 13 08/12/2022 09:45 AM    ALT 15 08/12/2022 09:45 AM       POC Tests: No results for input(s): "POCGLU", "POCNA", "POCK", "POCCL", "POCBUN", "POCHEMO", "POCHCT" in the last 72 hours.    Coags: No results found for: "PROTIME", "INR", "APTT"    HCG (If Applicable): No results found for: "PREGTESTUR", "PREGSERUM", "HCG", "HCGQUANT"     ABGs: No results found for: "PHART", "PO2ART", "PCO2ART", "HCO3ART", "BEART", "O2SATART"     Type & Screen (If Applicable):  No results found for: "LABABO"    Drug/Infectious Status (If Applicable):  No results found for: "HIV", "HEPCAB"    COVID-19 Screening (If Applicable): No results found for: "COVID19"        Anesthesia Evaluation  Patient summary reviewed and Nursing notes reviewed   no history of anesthetic complications:   Airway: Mallampati: II  TM distance: >3 FB   Neck ROM: full  Mouth opening: > = 3 FB   Dental: normal exam         Pulmonary:normal exam    (+) pneumonia:     sleep apnea: on noncompliant,       current smoker (former tobacco, current marijuana)                           Cardiovascular:    (+)  hyperlipidemia    (-) CAD    ECG reviewed  Rhythm: regular  Rate: normal    Stress test reviewed             ROS comment: Normal stress test 2019     Neuro/Psych:   (+)  neuromuscular disease:, psychiatric history:depression/anxiety             GI/Hepatic/Renal:   (+) GERD: well controlled     (-) liver disease and no renal disease      ROS comment: "Cocaine use" on problem list.   Endo/Other:        (-) diabetes mellitus               Abdominal:   (+) obese          Vascular:          Other Findings:             Anesthesia Plan      general     ASA 3     (Plan general endotracheal anesthesia with prone positioning for planned procedure.  Patient aware of and accepts risks of general anesthesia as discussed and outlined on the anesthesia consent form.  In addition, spinal surgery in the prone position is associated with the rare risk of post-operative visual loss that may be transient or permanent (4:500,000 cases, or .0008%). Patient is at increased risk if patient is male, obese, diabetic, surgeon uses Wilson frame, and/or patient experiences significant blood loss or long surgical time. Patient is aware of and accepts all risks including the rare risk of POVL and wants to proceed with this elective surgical procedure. All questions answered.  Consents signed.Plan general endotracheal anesthesia with prone positioning for planned procedure.  Patient aware of and accepts risks of general anesthesia as discussed and outlined on the anesthesia consent form.  In addition, spinal surgery in the prone position is associated with the rare risk of post-operative visual loss that may be transient or permanent (4:500,000 cases, or .0008%). Patient is at increased risk if patient is male, obese, diabetic, surgeon uses Wilson frame, and/or patient experiences significant blood loss or long surgical time. Patient is aware of and accepts all risks including the rare risk of POVL and wants to proceed with this elective surgical procedure. All questions answered.  Consents signed.)  Induction: intravenous.      Anesthetic plan and risks discussed with patient.      Plan discussed with  CRNA.                    Cynda Acres, MD   08/27/2022

## 2022-08-28 NOTE — Care Coordination-Inpatient (Signed)
CM NW met with patient and pt spouse at bedside. CM received consult for home health, referrals initiated in [facility]. Pt has post op pain medication prescribed. Post op appt with Dr. Carney Living on 09/05/22 at 1340 (on AVS). Pt spouse to provide transportation at discharge. CM following.

## 2022-08-28 NOTE — Progress Notes (Signed)
Bedside and Verbal shift change report given to K. Dareen Piano, RN (Cabin crew) by R. Christin Fudge, RN Physiological scientist). Report included the following information Nurse Handoff Report, Adult Overview, Surgery Report, Intake/Output, MAR, and Recent Results.

## 2022-08-28 NOTE — Plan of Care (Signed)
Problem: Pain  Goal: Verbalizes/displays adequate comfort level or baseline comfort level  08/28/2022 2345 by Maylon Peppers, RN  Outcome: Progressing  08/28/2022 1811 by Lambert Keto, RN  Outcome: Progressing     Problem: Discharge Planning  Goal: Discharge to home or other facility with appropriate resources  08/28/2022 2345 by Maylon Peppers, RN  Outcome: Progressing  Flowsheets (Taken 08/28/2022 2245)  Discharge to home or other facility with appropriate resources: Identify barriers to discharge with patient and caregiver  08/28/2022 1811 by Lambert Keto, RN  Outcome: Progressing     Problem: Safety - Adult  Goal: Free from fall injury  08/28/2022 2345 by Maylon Peppers, RN  Outcome: Progressing  Flowsheets (Taken 08/28/2022 2245)  Free From Fall Injury: Instruct family/caregiver on patient safety  08/28/2022 1811 by Lambert Keto, RN  Outcome: Progressing     Problem: ABCDS Injury Assessment  Goal: Absence of physical injury  Outcome: Progressing

## 2022-08-28 NOTE — Discharge Instructions (Signed)
Dr. Eldwin Volkov's Post-Operative Instructions Thoracic/Lumbar/Sacral Spine Surgery    DO NOT take any NSAIDS if you had Fusion Surgery.     ACTIVITIES:  *The first week after surgery   Change positions every hour while you are awake.  Walking is the best way to rebuild strength.   Activities around the house, such as washing dishes and preparing light meals are fine.   Avoid strenuous activities, such as vacuuming, and do not lift anything heavier than 1 gallon of milk (or about 5-8 pounds).   Do not bend over to pick up items from the ground level until 3 months post-op.    *Week 2 and beyond  You may gradually increase your activities, but avoid heavy lifting, pushing/pulling.   Walk at a pace that avoids fatigue or severe pain. Do not try to walk several blocks the first day! As you increase the distance, you may feel tired. If so, stop and rest.   Follow-up with Dr. Dyshaun Bonzo will be 2 weeks after surgery.    BATHING and INCISION CARE:  The incision may be tender or feel numb: this is normal.   Keep the incision clean and dry. You may shower 3 days after surgery. Cover the dressing with saran wrap before getting in the shower. The incision is closed with sutures under the skin and glue on top.   Do not apply any lotions, ointments or oils on the incision.   Do not remove the dressing. Your dressing will be changed at your first post op appointment. If it comes loose or is damaged, dirty or wet before this appointment, call your home health nurse (if you are being seen by a nurse at home) or the office to have the dressing changed.  If you notice any excessive swelling, redness, or persistent drainage around the incision, notify the office immediately.    CONSTIPATION:  Take a stool softener twice a day while you are taking a narcotic.   If you have not had a bowel movement within 3 days of surgery, you will need to use a laxative or suppository that can be obtained over the counter at your local pharmacy     ICE  Use  ice on your back to decrease pain and swelling.  Do NOT use heat.    MEDICATIONS:  If you had fusion surgery DO NOT TAKE non-steroidal anti-inflammatory (NSAID) medications, such as Motrin, Aleve, Advil Naprosyn, Ibuprofen or aspirin.   Take Tylenol/Acetaminophen every 4-6 hours for pain. Do not take more than 3000 mg each day. (Do not take Tylenol/Acetaminophen if you have liver problems).  Take your prescribed narcotic pain medication as needed for pain that is not tolerable.    Eat food before you take any pain medication to avoid nausea.    If you need a medication refill, please call the office during working hours at least 2 days before your prescription runs out. Do not wait until your bottle is empty to call for a refill.         NUTRITION:  Eat healthy to help your wound to heal.    Eat a healthy balanced diet to help your wound to heal. Protein supplements should be considered if you are eating less than 50% of your meal.   Drink plenty of water to stay hydrated.    DRIVING & RETURN TO WORK:   You will be told at your follow up appointment if it is safe for you to drive or return to work and   will be provided with a return-to-work-note if needed (please ask).   NEVER drive while taking narcotic medication.    WHEN TO CALL THE OFFICE:  If you have severe pain unrelieved by the medications, new numbness or tingling in your legs;        If you have a fever of 101.0F or greater   If you notice increased swelling, redness, or increased drainage from the incision    If you are not able to urinate  If you are not able to control your bowels       [facility] number is [phone].  They are open from 8:00am to 5:00pm Mon - Fri. After 5:00pm, or on weekends/holidays, please call the answering service at [phone] for a call back.

## 2022-08-28 NOTE — Op Note (Signed)
[facility]             [address] [place], [place]  [address]                            OPERATIVE REPORT      PATIENT NAME: Brandon Montoya, Brandon Montoya                DOB: 1965/05/02  MED REC NO: [id number]                       ROOM: 201  ACCOUNT NO: [id number]                       ADMIT DATE: 08/28/2022  PROVIDER: Pamalee Leyden, MD    DATE OF SERVICE:  08/28/2022    PREOPERATIVE DIAGNOSES:  Postlaminectomy syndrome, lumbar fracture, L2 pars.    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  Segmental spinal instrumentation L1, L2, L3 Stryker type with add-on to previous T10 to L2 fusion.  Debridement of pseudarthrosis in preparation for a posterior lumbar fusion.    SURGEON:  Pamalee Leyden, MD    ASSISTANT:  Yevonne Pax, PA.    ANESTHESIA:  General endotracheal.    ESTIMATED BLOOD LOSS:  500    SPECIMENS REMOVED:  Two sets cultures from the screw holes.    INTRAOPERATIVE FINDINGS:  Dense scar was apparent.  Fascial dehiscence was apparent.  The screws at L2 were grossly loose and had phlegmonous material in the screw holes.  Cultures were obtained.  The screws were up sized at L2 after debriding the screw sites.  Screws were placed in L3.  Crosslink was placed between T12 and L1.  Rigid fixation was afforded.  The spine from L1 to L3 was decorticated with a high-speed burr and debrided from the tips of the transverse process to the lamina.  Dense scar fine material was removed.  Bone graft with a combination of Infuse bone graft, autograft, and demineralized bone matrix was packed into this area.  Infuse bone graft was used because of this challenging fusion situation with previous pseudarthrosis.     COMPLICATIONS:  None.    IMPLANTS:   Stryker pedicle screws and rods.    INDICATIONS:  back pain    DESCRIPTION OF PROCEDURE:  Following induction of general endotracheal anesthesia, the patient turned in prone position on spinal frame.  The patient was prepped and draped in the usual fashion.  Midline incision was made.  Paramedian incision was made in the lumbodorsal fascia.  Subperiosteal dissection done from L1 down to L3.  C-arm image verified the surgical level.  Spine was debrided.  The screws were usually loosened at L2.  I cut the rod just below L1.  I was then able to remove the cap from L2 and the traumatically loose screw from L2.  Phlegmonous material was evident in the pedicle screw holes and it was debrided.  I upsized pedicle screws to get reasonable fixation to 7.5 screws.  At L3, 6.5 screws were utilized.  Utilizing anatomic landmarks, C-arm image, and direct palpation with rigid fixation found.  I debrided the rod above the previous fusion, appeared to be solid there.  Screws were  solid radiographically and visually.  Rod-to-rod connector was utilized above with Z-rod winking the L1 area with the L2 and L3 pedicle screws.  Final tightening and torquing was done.  Fluoroscopic imaging confirmed rigidity of the construct.  With curettes and a high-speed burr, I debrided the facets at L2-3, L1-2, the transverse processes of L1, L2, and L3, the pars, and tried to create as good fusion bed as possible.  This was done bilaterally.  Then, I packed with Infuse bone graft, autograft, and demineralized bone matrix into this void.  Vancomycin powder was instilled for infection prophylaxis.  The lumbodorsal fascia was redefined and attempted to be closed with #1 Vicryl, subcutaneous tissues closed with 2-0 Vicryl.  Subcutaneous drain was utilized.  Skin closed with a 3-0 Monocryl subcuticular suture and Dermabond.  A sterile occlusive dressing was placed upon the wound.  All counts were correct.  Estimated blood loss was approximately 500 mL,  mostly lost on the initial debridement of the spine on the left.  There were a large inflammatory veins perforating the pseudarthrosis area.  It took a bit to get them controlled.  The patient was brought to the recovery room in good and stable condition.        Pamalee Leyden, MD      MBK/AQS  D:  08/28/2022 11:38:49  T:  08/28/2022 15:21:39  JOB #:  [id number]

## 2022-08-28 NOTE — Other (Signed)
Patient assisted to the restroom and back to stretcher without incident.  Call bell within reach and no further needs at this time.

## 2022-08-28 NOTE — Progress Notes (Signed)
TRANSFER - IN REPORT:    Verbal report received from B. Kosler, RN on [facility].  being received from PACU for routine post-op      Report consisted of patient's Situation, Background, Assessment and   Recommendations(SBAR).     Information from the following report(s) Nurse Handoff Report, Adult Overview, Surgery Report, Intake/Output, MAR, and Recent Results was reviewed with the receiving nurse.    Opportunity for questions and clarification was provided.      Assessment completed upon patient's arrival to unit and care assumed.

## 2022-08-28 NOTE — Interval H&P Note (Signed)
Update History & Physical    The patient's History and Physical of August 26, 2022 was reviewed with the patient and I examined the patient. There was no change. The surgical site was confirmed by the patient and me.     Plan: The risks, benefits, expected outcome, and alternative to the recommended procedure have been discussed with the patient. Patient understands and wants to proceed with the procedure.     Electronically signed by Pamalee Leyden, MD on 08/28/2022 at 6:26 AM

## 2022-08-28 NOTE — Anesthesia Post-Procedure Evaluation (Signed)
Department of Anesthesiology  Postprocedure Note    Patient: Brandon Montoya.  MRN: [id number]  Birthdate: 1965-05-20  Date of evaluation: 08/28/2022    Procedure Summary       Date: 08/28/22 Room / Location: [facility] MAIN 08 / [facility] MAIN OR    Anesthesia Start: 0933 Anesthesia Stop: 1209    Procedure: LUMBAR TWO/THREE FUSION EXTENSION THORACIC TEN- LUMBAR TWO FUSION WITH C-ARM **INPATIENT STATUS DUE TO MEDICARE** (Spine Lumbar) Diagnosis:       Closed fracture of second lumbar vertebra, unspecified fracture morphology, initial encounter (HCC)      (Closed fracture of second lumbar vertebra, unspecified fracture morphology, initial encounter (HCC) [S32.029A])    Surgeons: Pamalee Leyden, MD Responsible Provider: Cynda Acres, MD    Anesthesia Type: General ASA Status: 3            Anesthesia Type: General    Aldrete Phase I: Aldrete Score: 10    Aldrete Phase II:      Anesthesia Post Evaluation    Comments: Post-Anesthesia Evaluation and Assessment    Cardiovascular Function/Vital Signs/Pain Score  Vitals  BP: 131/80  Temp: 97.8 F (36.6 C)  Temp Source: Temporal  Pulse: 90  Respirations: 17  SpO2: 97 %  Height: 185.4 cm (6\' 1" )  Weight - Scale: 108.3 kg (238 lb 11.2 oz)  Pain Level: 6     Patient is status post Procedure(s):  LUMBAR TWO/THREE FUSION EXTENSION THORACIC TEN- LUMBAR TWO FUSION WITH C-ARM **INPATIENT STATUS DUE TO MEDICARE**.    Nausea/Vomiting: Controlled.    Postoperative hydration reviewed and adequate.    Pain:  Managed.    Neurological Status:   At baseline.    Mental Status and Level of Consciousness: Arousable.    Pulmonary Status:   Adequate oxygenation and airway patent.    Complications related to anesthesia: None    Post-anesthesia assessment completed. No concerns.    Patient has met all discharge requirements.    Signed By: Cynda Acres, MD    August 28, 2022         No notable events documented.

## 2022-08-28 NOTE — Progress Notes (Addendum)
19:22 Assessment completed. Lungs are clear bilat. IS= 2200. Honey comb dsg on  back remains C/D/I with sm amt of bloody drainage per JP. Denies numbness or tingling  in extremities.Voiding per urinal w/o difficulty. Wife remains @ bedside.    22:45 Shift assessment completed. See nsg flowsheet for details.    02:35  Reassessed with 0 changes noted. Back dsg remains C/D/I with CMS intact.  JP has scant amt of bloody drainage present.Ice pack was filled & applied.. Continues to deny numbness or tingling. Resting quietly in bed with eyes closed between cares x for voiding per urinal w/o difficulty. Wife remains @ bedside in the recliner.    07:10 Bedside and Verbal shift change report given to I Young RN (Cabin crew) by Cletis Media RN (offgoing nurse). Report included the following information Nurse Handoff Report.

## 2022-08-28 NOTE — Other (Signed)
Reviewed PTA medication list with patient/caregiver and patient/caregiver denies any additional medications.     Patient admits to having a responsible adult care for them at home for at least 24 hours after surgery.    Patient encouraged to use gown warming system and informed that using said warming gown to regulate body temperature prior to a procedure has been shown to help reduce the risks of blood clots and infection.    Patient's pharmacy of choice verified and documented in PTA medication section.    Dual skin assessment & fall risk band verification completed with Faith A RN.

## 2022-08-28 NOTE — Progress Notes (Addendum)
Physical Therapy Goals:  Initiated 08/28/2022 to be met within 7-10 days.  Short Term Goals  Short Term Goal 1: Patient will perform supine to/from sit with SBA  Short Term Goal 2: Patient will perform sit to/from stand with SB-CGA in preperation for gait progression  Short Term Goal 3: Patient will ambulate 100 ft with RW and SB-CGA to progress OOB mobility  Short Term Goal 4: Patient will negotiate 3 stairs with handrails and CGA to simulate home entry    []   Patient has met MD mobilization critieria for d/c home   []   Recommend HH with 24 hour adult care   [x]   Benefit from additional acute PT session to address:  gait and stair training    PHYSICAL THERAPY EVALUATION    Patient: Brandon Montoya. (57 y.o. 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 Pt performed sit to stand with contact guard assist. Patient ambulated 100 feet with RW, GB applied, contact guard assist. Pt educated on icing, elevation, positioning, home safety, home exercise program, and activity recommendations. Home health physical therapy is recommended upon discharge from hospital. Will continue gait and stair training next session.    DEFICITS/IMPAIRMENTS:    , Body Structures, Functions, Activity Limitations  Requiring Skilled Therapeutic Intervention: Decreased functional mobility ;Decreased ROM;Decreased strength;Decreased sensation;Decreased balance;Increased pain    Patient will benefit from skilled intervention to address the above impairments.  Patient's rehabilitation potential is considered to be Therapy Prognosis: Good.  Factors which may influence rehabilitation potential include:   []          None noted  []          Mental ability/status  []          Medical condition  []          Home/family situation and support systems  []          Safety awareness  []          Pain tolerance/management  []          Other:      Recommendations and Planned Interventions:   Strengthening;ROM;Balance training;Functional mobility training;Transfer training;Endurance training;Gait training;Stair training;Neuromuscular re-education;Home exercise program;Safety education & training;Patient/Caregiver education & training;Equipment evaluation, education, & procurement;Therapeutic activities;Positioning    Frequency/Duration: Patient will be followed by physical therapy to address goals, 1-2 times per day/4-7 days per week to address goals.    Further Equipment Recommendations for Discharge:  Rolling Walker    Discharge Recommendations: Home with Home health PT    AMPAC:   AM-PAC Inpatient Mobility without Stair Climbing Raw Score : 15    This AMPAC score should be considered in conjunction with interdisciplinary team recommendations to determine the most appropriate discharge setting. Patient's social support, diagnosis, medical stability, and prior level of function should also be taken into consideration.     SUBJECTIVE:   Patient stated: "I am doing ok"  OBJECTIVE DATA SUMMARY:     Past Medical History:   Diagnosis Date    Anxiety and depression 1994    on meds. "was in [employer]"    Arthritis 2019    hx bilateral knee surgery & revisions    Chronic pain     bilateral knees & lower back    GERD (gastroesophageal reflux disease) 2002     Pantoprazole    History of blood transfusion     in [place] with one of the knee replacement surgery    Hx of chest pain 2019    & syncope- resolved. no cardiologist    Hyperlipidemia 2019    Atorvastatin    Marijuana use     positive for Opioids 01/09/2022    OSA (obstructive sleep apnea)     couldn't tolerated CPAP.    PTSD (post-traumatic stress disorder) 1994    on meds. "was in [employer]". Saw therapist in the past     Past Surgical History:   Procedure Laterality Date    BACK SURGERY  2022    fx back -hardware (rods & screws)    CERVICAL FUSION N/A 05/20/2022    ANTERIOR CERVICAL DECOMPRESSION FUSION CERVICAL SIX/SEVEN, CERVICAL SEVEN/THORACIC ONE; C-ARMVictorino Dike SPINE] performed by Pamalee Leyden, MD at [facility] MA[facility] OR    CHOLECYSTECTOMY, LAPAROSCOPIC  07/03/2017    COLONOSCOPY      ESOPHAGOGASTRODUODENOSCOPY  09/30/2018    KNEE ARTHROPLASTY Bilateral 2019    with revisions    MASTECTOMY Right     due to gynecomastia     Barriers to Learning/Limitations: none  Compensate with: visual, verbal, tactile, kinesthetic cues/model  Home Situation:  Social/Functional History  Lives With: Spouse  Type of Home: House  Home Layout: One level  Home Access: Stairs to enter with rails  Entrance Stairs - Number of Steps: 3  Entrance Stairs - Rails: Both  Home Equipment: None  Strength:    Strength: Within functional limits  Tone & Sensation:   Tone: Normal  Sensation: Intact  Range Of Motion:  AROM: Within functional limits  PROM: Within functional limits  Functional Mobility:  Bed Mobility:   Bed Mobility Training  Bed Mobility Training: Yes  Interventions: Tactile cues;Verbal cues;Demonstration  Rolling: Contact-guard assistance  Supine to Sit: Contact-guard assistance  Sit to Supine: Contact-guard assistance  Transfers:   Art therapist: Yes  Interventions: Verbal cues;Safety awareness training;Demonstration  Sit to Stand: Contact-guard assistance  Stand to Sit: Contact-guard  assistance  Ambulation/Gait Training:  Gait  Gait Training: Yes  Overall Level of Assistance: Contact-guard assistance  Distance (ft): 100 Feet  Assistive Device: Gait belt;Walker, rollator  Interventions: Demonstration;Verbal cues;Safety awareness training  Speed/Cadence: Slow  Step Length: Left shortened;Right shortened  Stance: Time  Gait Abnormalities: Antalgic;Decreased step clearance  Pain:  Pain level pre-treatment: 4/10   Pain level post-treatment: 4/10   Pain Intervention(s): Medication (see MAR); Rest, Ice, Repositioning  Response to intervention: Nurse notified, See doc flow    Activity Tolerance:   Activity Tolerance: Patient tolerated evaluation without incident;Patient tolerated treatment well    After treatment:   []          Patient left in no apparent distress sitting up in chair  [x]          Patient left in no apparent distress in bed  [x]          Call bell left within reach  [x]          Nursing notified  []   Caregiver present  []          Bed alarm activated  []          SCDs applied    COMMUNICATION/EDUCATION:   Patient Education  Education Given To: Patient  Education Provided: Role of Therapy;Plan of Care;Home Exercise Program;Transfer Training;Equipment;Fall Prevention Strategies  Education Method: Demonstration;Verbal  Barriers to Learning: None  Education Outcome: Verbalized understanding;Continued education needed    Eval Complexity: Decision Making: Low Complexity    Thank you for this referral.  Marrian Salvage, PT  Minutes: 64

## 2022-08-28 NOTE — Plan of Care (Signed)
Problem: Pain  Goal: Verbalizes/displays adequate comfort level or baseline comfort level  Outcome: Progressing     Problem: Discharge Planning  Goal: Discharge to home or other facility with appropriate resources  Outcome: Progressing     Problem: Safety - Adult  Goal: Free from fall injury  Outcome: Progressing

## 2022-08-28 NOTE — Other (Signed)
[facility] made aware that SBAR is ready for review. Patient assigned room #201. Robyn RN will be the nurse

## 2022-08-28 NOTE — Progress Notes (Signed)
Neuro intact  Incisional pain  40 total from JP  Mobilize  Pt ot

## 2022-08-28 NOTE — Brief Op Note (Signed)
Brief Postoperative Note      Patient: Brandon Montoya.  Date of Birth: 20-May-1965  MRN: [id number]    Date of Procedure: September 26, 2022    Pre-Op Diagnosis Codes:      * Closed fracture of second lumbar vertebra, unspecified fracture morphology, initial encounter (HCC) [S32.029A]    Post-Op Diagnosis:  Fracture L2, pseudoarthrosis L1/2       Procedure(s):  LUMBAR TWO/THREE FUSION EXTENSION THORACIC TEN- LUMBAR TWO FUSION WITH C-ARM **INPATIENT STATUS DUE TO MEDICARE**    Surgeon(s):  Pamalee Leyden, MD    Assistant:  Physician Assistant: Sallyanne Havers, PA-C    Anesthesia: General    Estimated Blood Loss (mL): 500    Complications: None    Specimens:   ID Type Source Tests Collected by Time Destination   1 : swab lumbar spine Swab Spine CULTURE, ANAEROBIC, CULTURE, WOUND Pamalee Leyden, MD 2022-09-26 1029    2 : SWAB LUMBAR SPINE Swab Spine CULTURE, ANAEROBIC, CULTURE, WOUND Pamalee Leyden, MD 09-26-2022 1048        Implants:  Implant Name Type Inv. Item Serial No. Manufacturer Lot No. LRB No. Used Action   KIT GRAFT BONE SM RHBMP-2 4.2MG  INJ CONTAIN NDL 20GA - [id number]  KIT GRAFT BONE SM RHBMP-2 4.2MG  INJ CONTAIN NDL 20GA  [employer] SPINALGRAFT TECH-WD [id number] N/A 1 Implanted   GRAFT BNE SUB 10CC FOAM PK VERSATILE COMPR RESIST VITOSS [id number]] [employer]] - [id number]  GRAFT BNE SUB 10CC FOAM PK VERSATILE COMPR RESIST VITOSS [id number]] [employer]]  STRYKER [employer] HOWM-WD [id number] N/A 1 Implanted   SCREW SPNL POLYAX 6.5X45 MM CANN SERRATO - [id number]  SCREW SPNL POLYAX 6.5X45 MM CANN SERRATO  STRYKER SPINE HOWM-WD [id number] N/A 2 Implanted   SCREW SPNL POLYAX 7.5X50 MM CANN SERRATO - [id number]  SCREW SPNL POLYAX 7.5X50 MM CANN SERRATO  STRYKER SPINE HOWM-WD [id number] N/A 2 Implanted   CONNECTOR SPNL XW 5.5/6 MM SIDE CLOSED FOR ROD EVEREST - [id number]  CONNECTOR SPNL XW 5.5/6 MM SIDE CLOSED FOR ROD EVEREST  STRYKER SPINE HOWM-WD [id number] N/A 2 Implanted   ROD SPNL 5.5X300  MM OFFSET TI DENALI - [id number]  ROD SPNL 5.5X300 MM OFFSET TI DENALI  STRYKER SPINE HOWM-WD [id number] N/A 2 Implanted   BLOCKER SPNL L50MM DIA6MM TI 1 LEV XIA 3 - [id number]  BLOCKER SPNL L50MM DIA6MM TI 1 LEV XIA 3  STRYKER SPINE HOWM-WD [id number] N/A 4 Implanted         Drains:   Urinary Catheter 09-26-22 2 Way (Active)   $ Urethral catheter insertion Inserted for procedure Sep 26, 2022 1017   Site Assessment Pink 09/26/22 1017   Urine Color Yellow 2022/09/26 1017   Urine Appearance Clear 09/26/2022 1017   Collection Container Standard Sep 26, 2022 1017   Securement Method Securing device (Describe) 09/26/2022 1017   Catheter Care  Perineal wipes 2022-09-26 1017   Catheter Best Practices  Drainage tube clipped to bed;Drainage bag less than half full;Catheter secured to thigh;Tamper seal intact;Bag below bladder;Bag not on floor;Lack of dependent loop in tubing 2022-09-26 1017   Status Draining 2022-09-26 1017   Discontinuation Reason Per provider order September 26, 2022 1017       Findings:  Infection Present At Time Of Surgery (PATOS) (choose all levels that have infection present):  - Organ Space infection (below fascia) present as evidenced by phlegmon  Other Findings: pseudo L1-2, crews L2 loose, ?infection    Electronically signed by Pamalee Leyden, MD on Sep 26, 2022 at 11:29 AM

## 2022-08-29 LAB — BASIC METABOLIC PANEL
Anion Gap: 7 mmol/L (ref 3.0–18)
BUN/Creatinine Ratio: 13 (ref 12–20)
BUN: 14 mg/dL (ref 7.0–18)
CO2: 27 mmol/L (ref 21–32)
Calcium: 8.6 mg/dL (ref 8.5–10.1)
Chloride: 102 mmol/L (ref 100–111)
Creatinine: 1.09 mg/dL (ref 0.6–1.3)
Est, Glom Filt Rate: 80 mL/min/{1.73_m2} (ref >60)
Glucose: 135 mg/dL — ABNORMAL HIGH (ref 74–99)
Potassium: 3.8 mmol/L (ref 3.5–5.5)
Sodium: 136 mmol/L (ref 136–145)

## 2022-08-29 LAB — CBC
Hematocrit: 34.3 % — ABNORMAL LOW (ref 36.0–48.0)
Hemoglobin: 11.6 g/dL — ABNORMAL LOW (ref 13.0–16.0)
MCH: 32.7 pg (ref 24.0–34.0)
MCHC: 33.8 g/dL (ref 31.0–37.0)
MCV: 96.6 FL (ref 78.0–100.0)
MPV: 11.2 FL (ref 9.2–11.8)
Nucleated RBCs: 0 /100{WBCs}
Platelets: 122 10*3/uL — ABNORMAL LOW (ref 135–420)
RBC: 3.55 M/uL — ABNORMAL LOW (ref 4.35–5.65)
RDW: 12.5 % (ref 11.6–14.5)
WBC: 9.3 10*3/uL (ref 4.6–13.2)
nRBC: 0 10*3/uL (ref 0.00–0.01)

## 2022-08-29 MED ORDER — DIPHENHYDRAMINE HCL 25 MG PO CAPS
25 | Freq: Four times a day (QID) | ORAL | Status: DC | PRN
Start: 2022-08-29 — End: 2022-08-30
  Administered 2022-08-30 (×2): 50 mg via ORAL

## 2022-08-29 MED ORDER — DIPHENHYDRAMINE HCL 50 MG/ML IJ SOLN
50 | Freq: Four times a day (QID) | INTRAMUSCULAR | Status: DC | PRN
Start: 2022-08-29 — End: 2022-08-30
  Administered 2022-08-29: 21:00:00 25 mg via INTRAVENOUS

## 2022-08-29 MED ORDER — TRANEXAMIC ACID-NACL 1000-0.7 MG/100ML-% IV SOLN
Freq: Once | INTRAVENOUS | Status: DC
Start: 2022-08-29 — End: 2022-08-30

## 2022-08-29 MED ORDER — HYDROMORPHONE HCL 4 MG PO TABS
4 | ORAL | Status: DC | PRN
Start: 2022-08-29 — End: 2022-08-30
  Administered 2022-08-29 – 2022-08-30 (×4): 4 mg via ORAL

## 2022-08-29 MED FILL — OXYCODONE HCL 5 MG PO TABS: 5 MG | ORAL | Qty: 2

## 2022-08-29 MED FILL — DIPHENHYDRAMINE HCL 50 MG/ML IJ SOLN: 50 MG/ML | INTRAMUSCULAR | Qty: 1

## 2022-08-29 MED FILL — PANTOPRAZOLE SODIUM 40 MG PO TBEC: 40 MG | ORAL | Qty: 1

## 2022-08-29 MED FILL — SEROQUEL 200 MG PO TABS: 200 MG | ORAL | Qty: 1

## 2022-08-29 MED FILL — FLUOXETINE HCL 20 MG PO CAPS: 20 MG | ORAL | Qty: 2

## 2022-08-29 MED FILL — HYDROMORPHONE HCL 1 MG/ML IJ SOLN: 1 MG/ML | INTRAMUSCULAR | Qty: 1

## 2022-08-29 MED FILL — HYDROMORPHONE HCL 4 MG PO TABS: 4 MG | ORAL | Qty: 1

## 2022-08-29 MED FILL — FAMOTIDINE 20 MG PO TABS: 20 MG | ORAL | Qty: 1

## 2022-08-29 MED FILL — ACETAMINOPHEN 325 MG PO TABS: 325 MG | ORAL | Qty: 2

## 2022-08-29 MED FILL — CLONAZEPAM 0.5 MG PO TABS: 0.5 MG | ORAL | Qty: 4

## 2022-08-29 MED FILL — BISACODYL EC 5 MG PO TBEC: 5 MG | ORAL | Qty: 1

## 2022-08-29 MED FILL — DIPHENHYDRAMINE HCL 25 MG PO CAPS: 25 MG | ORAL | Qty: 2

## 2022-08-29 MED FILL — CEFAZOLIN SODIUM 2 G IV SOLR: 2 g | INTRAVENOUS | Qty: 2000

## 2022-08-29 MED FILL — TRANEXAMIC ACID-NACL 1000-0.7 MG/100ML-% IV SOLN: INTRAVENOUS | Qty: 100

## 2022-08-29 MED FILL — POLYETHYLENE GLYCOL 3350 17 G PO PACK: 17 g | ORAL | Qty: 1

## 2022-08-29 NOTE — Care Coordination-Inpatient (Signed)
08/29/22 0840   IMM Letter   IMM Letter given to Patient/Family/Significant other/Guardian/POA/by: Jamie Kato RN   IMM Letter date given: 08/29/22   IMM Letter time given: 0830

## 2022-08-29 NOTE — Progress Notes (Signed)
Vss afeb  Neuro intact  Doing wel  Dc jp  Dc home  Fu 2 weeks

## 2022-08-29 NOTE — Discharge Summary (Signed)
Discharge/Transfer  Summary     Patient: Brandon Montoya. MRN: [id number]  SSN: [id number]    Date of Birth: Dec 12, 1965  Age: 57 y.o.  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 [S32.009K]    Discharge Diagnoses:      Discharge Condition: Good      Surgery: LUMBAR TWO/THREE FUSION EXTENSION THORACIC TEN- LUMBAR TWO FUSION WITH C-ARM **INPATIENT STATUS DUE TO MEDICARE**: 22612 (CPT)     Procedure(s) (LRB):  LUMBAR TWO/THREE FUSION EXTENSION THORACIC TEN- LUMBAR TWO FUSION WITH C-ARM **INPATIENT STATUS DUE TO MEDICARE** (N/A)       Hospital Course: benign      Disposition: home    Discharge Medications:   Current Discharge Medication List        START taking these medications    Details   oxyCODONE (ROXICODONE) 5 MG immediate release tablet Take 1 tablet by mouth every 6 hours as needed for Pain for up to 7 days. Intended supply: 7 days. Take lowest dose possible to manage pain Max Daily Amount: 20 mg  Qty: 28 tablet, Refills: 0    Comments: Reduce doses taken as pain becomes manageable  Associated Diagnoses: S/P lumbar fusion           CONTINUE these medications which have NOT CHANGED    Details   FLUoxetine (PROZAC) 20 MG capsule Take 2 capsules by mouth      atorvastatin (LIPITOR) 20 MG tablet Take 1 tablet by mouth daily Indications: high cholesterol      pantoprazole (PROTONIX) 40 MG tablet Take 1 tablet by mouth daily Indications: GERD      medical marijuana Take by mouth as needed.      clonazePAM (KLONOPIN) 1 MG tablet Take 2 tablets by mouth 2 times daily. Indications: Anxiety/depression/PTSD      QUEtiapine (SEROQUEL) 200 MG tablet Take 1 tablet by mouth 2 times daily Indications: Anxiety/depression/PTSD               No follow-ups on file.    Signed By: Pamalee Leyden, MD     August 29, 2022

## 2022-08-29 NOTE — Plan of Care (Signed)
Problem: Pain  Goal: Verbalizes/displays adequate comfort level or baseline comfort level  08/29/2022 2309 by Maylon Peppers, RN  Outcome: Progressing  08/29/2022 1030 by Maple Mirza, RN  Outcome: Progressing     Problem: Discharge Planning  Goal: Discharge to home or other facility with appropriate resources  08/29/2022 2309 by Maylon Peppers, RN  Outcome: Progressing  Flowsheets (Taken 08/29/2022 2233)  Discharge to home or other facility with appropriate resources: Identify barriers to discharge with patient and caregiver  08/29/2022 1030 by Maple Mirza, RN  Outcome: Progressing     Problem: Safety - Adult  Goal: Free from fall injury  08/29/2022 2309 by Maylon Peppers, RN  Outcome: Progressing  Flowsheets (Taken 08/29/2022 2233)  Free From Fall Injury: Instruct family/caregiver on patient safety  08/29/2022 1030 by Maple Mirza, RN  Outcome: Progressing     Problem: ABCDS Injury Assessment  Goal: Absence of physical injury  08/29/2022 2309 by Maylon Peppers, RN  Outcome: Progressing  Flowsheets (Taken 08/29/2022 2233)  Absence of Physical Injury: Implement safety measures based on patient assessment  08/29/2022 1030 by Maple Mirza, RN  Outcome: Progressing

## 2022-08-29 NOTE — Progress Notes (Addendum)
19:30 Assessment completed. Lungs are clear bilat IS = 3000. Honey comb dsg remains C/D/I with with CMS intact. Denies numbness or tingling or pain or discomfort in calves. Voiding per urinal w/o difficulty. Wife remains @ bedside in the recliner.     22:30 Shift assessment completed. See nsg flow sheet for detail    03:25 Reassessed with 0 changes noted. Back dsg remains C/D/I with CMS intact.Voiding per urinal w/o difficulty.Resting quietly in bed with eyes closed between cares.Wife remains @ bedside in the recliner.     07:05 Bedside and Verbal shift change report given to I Young RN (Cabin crew) by Cletis Media RN (offgoing nurse). Report included the following information Nurse Handoff Report.

## 2022-08-29 NOTE — Progress Notes (Signed)
Physical Therapy Attempt    Chart reviewed.  Pt refused to participate in physical therapy session due to:    []   Eating meal  []   Nausea  []   Dizziness  []   Lethargy  []   Lab Results  []   Blood Transfusion  []   Dialysis  [x]   Pain  [x]   Other:  Pt requesting PT to return later as he is having 8/10 pain (numerical pain scale).     Will attempt later today as pt schedule allows.    Darnelle Going PT

## 2022-08-29 NOTE — Plan of Care (Signed)
Problem: Pain  Goal: Verbalizes/displays adequate comfort level or baseline comfort level  08/29/2022 1030 by Maple Mirza, RN  Outcome: Progressing  08/28/2022 2345 by Maylon Peppers, RN  Outcome: Progressing     Problem: Discharge Planning  Goal: Discharge to home or other facility with appropriate resources  08/29/2022 1030 by Maple Mirza, RN  Outcome: Progressing  08/28/2022 2345 by Maylon Peppers, RN  Outcome: Progressing  Flowsheets (Taken 08/28/2022 2245)  Discharge to home or other facility with appropriate resources: Identify barriers to discharge with patient and caregiver     Problem: Safety - Adult  Goal: Free from fall injury  08/29/2022 1030 by Maple Mirza, RN  Outcome: Progressing  08/28/2022 2345 by Maylon Peppers, RN  Outcome: Progressing  Flowsheets (Taken 08/28/2022 2245)  Free From Fall Injury: Instruct family/caregiver on patient safety     Problem: ABCDS Injury Assessment  Goal: Absence of physical injury  08/29/2022 1030 by Maple Mirza, RN  Outcome: Progressing  08/28/2022 2345 by Maylon Peppers, RN  Outcome: Progressing

## 2022-08-29 NOTE — Progress Notes (Signed)
Physical Therapy Attempt    Chart reviewed.  Pt deferred to participate in physical therapy session due to:    []   Eating meal  []   Nausea  []   Dizziness  []   Lethargy  []   Lab Results  []   Blood Transfusion  []   Dialysis  [x]   Pain  [x]   Other:  Pt continues to c/o pain, 10/10 using numerical pain scale and MD has been called per Nurse Shaune Pascal.      Will attempt tomorrow as pt schedule allows.    Darnelle Going PT

## 2022-08-29 NOTE — Care Coordination-Inpatient (Signed)
CM updating discharge plan to outpatient PT, CM not able to find an accepting home health agency for pt. CM left a message on Dr. Harden Mo line requesting outpatient PT. CM also was connected post op line but no answer and could not leave a message. Shaune Pascal, RN made aware updated discharge plan, and will give dressing to take home. Pt and pt spouse also made aware of the change to outpatient physical therapy. All questions answered. CM available for any needs that may arise.

## 2022-08-29 NOTE — Progress Notes (Signed)
Occupational Therapy Goals:  Initiated 08/29/2022 to be met within 7-10 days.  Short Term Goals  Time Frame for Short Term Goals: 7 days  Short Term Goal 1: Patient will complete grooming at sink with supervision  Short Term Goal 2: Patient will complete bathing with supervision while adhering to back precautions  Short Term Goal 3: Patient will complete toileting with supervision  Short Term Goal 4: Patient will complete LBD with supervision and adaptive equipment as needed while adhering to back precautions    OCCUPATIONAL THERAPY EVALUATION    Patient: Brandon Montoya. (57 y.o. 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 Patient presented supine in bed with gripper socks and drain. Patient with visitor present for entire session. Patient completed assessment of ADLs and BUE strength, ROM (see details below).    Patient was educated on back precautions, verbalized 3/3 with cue. Patient was educated on adaptive dressing strategies. Patient provided adaptive equipment for lower body dressing/bathing including reacher, sock aid, long handled shoe horn, and long handled sponge. Patient educated on each item. Patient educated on use of reacher to thread underwear/pants, completed with minimum assist, and to doff socks, completed with standby assist. Patient educated on use of sock aid to don socks, completed with standby assist. Patient educated on use of long  handled shoe horn. Patient educated on use of long handled sponge for bathing/lotion application. Patient given opportunity to ask questions about use of adaptive equipment. Patient educated on icing, mobility schedule, verbalized understanding. Patient educated on bathing strategies, verbalized understanding. Patient completed grooming (oral hygiene) contact guard assist at sink.     Patient balance, mobility and decreased strength and ROM in back does impact ADL and functional mobility status.  Patient would benefit from [facility] at discharge to increase safety and independence with ADLs and functional mobility.     DEFICITS/IMPAIRMENTS:  Performance deficits / Impairments: Decreased functional mobility ;Decreased high-level IADLs;Decreased ADL status;Decreased endurance;Decreased ROM;Decreased strength;Decreased balance;Decreased coordination    Patient will benefit from skilled intervention to address the above impairments.  Patient's rehabilitation potential is considered to be Prognosis: Good.  Factors which may influence rehabilitation potential include:   []              None noted  []              Mental ability/status  [x]              Medical condition  [x]              Home/family situation and support systems  [x]              Safety awareness  [x]              Pain tolerance/management  []              Other:      PLAN :  Recommendations and Planned Interventions:      [x]   Self Care/ ADL Training         [x]       Therapeutic Activities  [x]                Functional Mobility Training   []       Cognitive Retraining  [x]                Therapeutic Exercises           [x]       Endurance Activities  [x]                Balance Training                    []       Neuromuscular Re-Education  []                Visual/Perceptual Training     [x]       Home Safety Training  [x]                Patient Education                   [x]       Family Training/Education  [x]                Strengthening                         [x]       ROM  []                Wheelchair mobility                [x]      Pain management  [x]                Safety education/training       [x]      Positioning  [x]               Equipment education              []      Return to work training  [x]               Coordination training              []       Energy conservation  [x]               Home management training and IADLs  []                Other (comment):  Frequency/Duration: Patient will be followed by occupational therapy to address goals, 1 time per day/3-5 days per week to address goals.    Further Equipment Recommendations for Discharge: Dan Humphreys,      Discharge Recommendation: Discharge Recommendations: Home with Home health OT    AMPAC: AM-PAC Inpatient Daily Activity Raw Score: 19/24  Current research shows that an AM-PAC score of 18 or greater is associated with a discharge to the patient's home setting.  Based on an AM-PAC score and their current ADL deficits; it is recommended that the patient have 2-3 sessions per week of Occupational Therapy at d/c to increase the patient's independence.    This AMPAC score should be considered in conjunction with interdisciplinary team recommendations to determine the most appropriate discharge setting. Patient's social support, diagnosis, medical stability, and prior level of function should also be taken into consideration.     SUBJECTIVE:   Patient stated "I'm hurting and sore."  OBJECTIVE DATA SUMMARY:     Past Medical History:   Diagnosis Date    Anxiety and depression 1994    on meds. "was in [employer]"    Arthritis 2019    hx bilateral knee surgery & revisions    Chronic pain     bilateral knees & lower back    GERD (gastroesophageal reflux disease) 2002    Pantoprazole    History of blood transfusion     in [place] with one of the knee replacement surgery    Hx of chest pain 2019    & syncope- resolved. no cardiologist    Hyperlipidemia 2019    Atorvastatin    Marijuana use     positive for Opioids  01/09/2022    OSA (obstructive sleep apnea)     couldn't tolerated CPAP.    PTSD (post-traumatic stress disorder) 1994    on meds. "was in [employer]". Saw therapist in the past     Past Surgical History:   Procedure Laterality Date    BACK SURGERY  2022    fx back -hardware (rods & screws)    CERVICAL FUSION N/A 05/20/2022    ANTERIOR CERVICAL DECOMPRESSION FUSION CERVICAL SIX/SEVEN, CERVICAL SEVEN/THORACIC ONE; C-ARMVictorino Dike SPINE] performed by Pamalee Leyden, MD at [facility] MAIN OR    CHOLECYSTECTOMY, LAPAROSCOPIC  07/03/2017    COLONOSCOPY      ESOPHAGOGASTRODUODENOSCOPY  09/30/2018    KNEE ARTHROPLASTY Bilateral 2019    with revisions    MASTECTOMY Right     due to gynecomastia     Barriers to Learning/Limitations: physical  Compensate with: visual, verbal, tactile, kinesthetic cues/model    Home Situation:   Social/Functional History  Lives With: Spouse  Type of Home: House  Home Layout: One level  Home Access: Stairs to enter with rails  Entrance Stairs - Number of Steps: 3  Entrance Stairs - Rails: Both  Bathroom Shower/Tub: Medical sales representative: Handicap height  Bathroom Equipment: Safety frame  Home Equipment: None, Cane  []   Right hand dominant   []   Left hand dominant    Cognitive/Behavioral Status:     WFL                               Skin: dressing to back  Edema: none visualized    Vision/Perceptual:    Vision: Within Functional Limits                               Coordination: BUE  Coordination: Within functional limits            Balance:     Balance  Sitting: Intact  Standing: With support    Strength: BUE     Strength: Within functional limits    Tone & Sensation: BUE        Tone: Normal  Sensation: Intact    Range of Motion: BUE        AROM: Within functional limits  PROM: Within functional limits      Functional Mobility and Transfers for ADLs:  Bed Mobility:     Bed Mobility Training  Bed Mobility Training: Yes  Rolling: Stand-by assistance  Supine to Sit: Stand-by  assistance  Transfers:                 Psychologist, prison and probation services Training: Yes  Interventions: Verbal  cues;Safety awareness training;Demonstration  Sit to Stand: Contact-guard assistance  Stand to Sit: Stand-by assistance    ADL Assessment:      Feeding: Independent  Grooming: Contact guard assistance  UE Bathing: Stand by assistance  LE Bathing: Minimal assistance;Adaptive equipment  UE Dressing: Setup  LE Dressing: Minimal assistance;Adaptive equipment;Increased time to complete  Toileting: Contact guard assistance        Pain:  Pain level pre-treatment: 5/10   Pain level post-treatment: 5/10   Pain Intervention(s): Rest, Ice, Repositioning   Response to intervention: Nurse notified    Activity Tolerance:   Activity Tolerance: Patient Tolerated treatment well  Please refer to the flowsheet for vital signs taken during this treatment.    After treatment:   Safety Devices  Type of Devices: Left in bed, Call light within reach   COMMUNICATION/EDUCATION:   Patient Education  Education Given To: Patient  Education Provided: Role of Therapy;Transfer Training;Plan of Care;ADL Adaptive Strategies;Fall Prevention Strategies;Mobility Training;Precautions;Equipment  Education Method: Verbal  Barriers to Learning: None  Education Outcome: Verbalized understanding;Demonstrated understanding    Thank you for this referral.  Charlena Cross, OTD, OTR/L  Minutes: 32    Eval Complexity: Decision Making: Low Complexity

## 2022-08-29 NOTE — Progress Notes (Addendum)
0930: Shift assessment completed & documented in flowsheet, Pt stable at moment, no s/sx of distress, chest rise and fall equally, no SOB noted at this moment, HS S1S2 stable & reg, V/S stable at this moment, Honeycomb dressing to lower/mid back is C/D/I, Pt currently resting in bed at safe position, call bell within reach, ongoing monitoring in place; JP drain removed w/o complication, skin around site is C/D/I, JP site covered with Telfa & Tegaderm & optifoam is covering surgical incision, Pt tolerated removal well, Pt educated on site care and when to notify MD office, Pt verbalized understanding.    1316: MD notified of Pt's request to stay another night r/t "pain control" per Pt,  New orders received from Carney Living, MD & placed in orders MD    -Dilaudid 4 mg PRN Q 4 hrs  -Ok for Pt to stay over night  -Encourage therapy     1607: MD contact r/t Pt's request to increase dose of Benadryl, new orders received from Carney Living, MD    -Benadryl 50 mg Q 6 hrs PRN

## 2022-08-30 MED FILL — HYDROMORPHONE HCL 2MG TABS: 2 2 MG | ORAL | 10 days supply | Qty: 56 | Fill #0 | Status: AC

## 2022-08-30 MED FILL — CEPHALEXIN 500MG CAPS: 500 500 MG | ORAL | 14 days supply | Qty: 56 | Fill #0 | Status: AC

## 2022-08-30 MED FILL — OXYCODONE HCL 5 MG PO TABS: 5 MG | ORAL | Qty: 2

## 2022-08-30 MED FILL — PANTOPRAZOLE SODIUM 40 MG PO TBEC: 40 MG | ORAL | Qty: 1

## 2022-08-30 MED FILL — CLONAZEPAM 0.5 MG PO TABS: 0.5 MG | ORAL | Qty: 4

## 2022-08-30 MED FILL — HYDROMORPHONE HCL 4 MG PO TABS: 4 MG | ORAL | Qty: 1

## 2022-08-30 MED FILL — FAMOTIDINE (PF) 20 MG/2ML IV SOLN: 20 MG/2ML | INTRAVENOUS | Qty: 2

## 2022-08-30 MED FILL — DIPHENHYDRAMINE HCL 25 MG PO CAPS: 25 MG | ORAL | Qty: 2

## 2022-08-30 MED FILL — ACETAMINOPHEN 325 MG PO TABS: 325 MG | ORAL | Qty: 2

## 2022-08-30 MED FILL — FLUOXETINE HCL 20 MG PO CAPS: 20 MG | ORAL | Qty: 2

## 2022-08-30 MED FILL — SEROQUEL 200 MG PO TABS: 200 MG | ORAL | Qty: 1

## 2022-08-30 MED FILL — BISACODYL EC 5 MG PO TBEC: 5 MG | ORAL | Qty: 1

## 2022-08-30 MED FILL — POLYETHYLENE GLYCOL 3350 17 G PO PACK: 17 g | ORAL | Qty: 1

## 2022-08-30 MED FILL — FAMOTIDINE 20 MG PO TABS: 20 MG | ORAL | Qty: 1

## 2022-08-30 NOTE — Progress Notes (Signed)
Positive culture result  Consulted ID (Dr. Almyra Free) - outpatient referral appropriate, cleared to discharge with PO Keflex. medication ordered, referral placed   Dr. Carney Living aware and agreed with plan  Continue with Dc home as planned

## 2022-08-30 NOTE — Progress Notes (Signed)
1021: Shift assessment completed & documented in flowsheet, Pt stable at moment, no s/sx of distress, chest rise and fall equally, no SOB noted at this moment, HS S1S2 stable & reg, BS x4 normoactive, V/S stable at this moment, optifoam dressing to mid back is C/D/I, Pt currently resting in bed at safe position, call bell within reach, ongoing monitoring in place.    1450: Discharge instructions & AVS reviewed Pt, no questions or concerns voiced, Pt verbalized understanding of all education, IV removed w/o complications

## 2022-08-30 NOTE — Discharge Summary (Signed)
Discharge/Transfer  Summary     Patient: Brandon Montoya. MRN: [id number]  SSN: [id number]    Date of Birth: 21-Dec-1965  Age: 57 y.o.  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 Intra op culture back with gram positive.      Disposition: home    Discharge Medications:   Current Discharge Medication List        START taking these medications    Details   oxyCODONE (ROXICODONE) 5 MG immediate release tablet Take 1 tablet by mouth every 6 hours as needed for Pain for up to 7 days. Intended supply: 7 days. Take lowest dose possible to manage pain Max Daily Amount: 20 mg  Qty: 28 tablet, Refills: 0    Comments: Reduce doses taken as pain becomes manageable  Associated Diagnoses: S/P lumbar fusion           CONTINUE these medications which have NOT CHANGED    Details   FLUoxetine (PROZAC) 20 MG capsule Take 2 capsules by mouth      atorvastatin (LIPITOR) 20 MG tablet Take 1 tablet by mouth daily Indications: high cholesterol      pantoprazole (PROTONIX) 40 MG tablet Take 1 tablet by mouth daily Indications: GERD      medical marijuana Take by mouth as needed.      clonazePAM (KLONOPIN) 1 MG tablet Take 2 tablets by mouth 2 times daily. Indications: Anxiety/depression/PTSD      QUEtiapine (SEROQUEL) 200 MG tablet Take 1 tablet by mouth 2 times daily Indications: Anxiety/depression/PTSD               No follow-ups on  file.    Signed By: Pamalee Leyden, MD     August 30, 2022

## 2022-08-30 NOTE — Care Coordination-Inpatient (Signed)
CM NW arranged outpatient physical therapy with [facility] [address], [place], [place] [address] on Wednesday 09/04/2022 at 430pm. This appointment has been confirmed with the patient and his spouse and added to the AVS.

## 2022-08-30 NOTE — Progress Notes (Signed)
Physical Therapy Goals:  Initiated 08/30/2022 to be met within 7-10 days.  Short Term Goals  Short Term Goal 1: Patient will perform supine to/from sit with SBA  Short Term Goal 2: Patient will perform sit to/from stand with SB-CGA in preperation for gait progression  Short Term Goal 3: Patient will ambulate 100 ft with RW and SB-CGA to progress OOB mobility  Short Term Goal 4: Patient will negotiate 3 stairs with handrails and CGA to simulate home entry    [x]   Patient has met MD mobilization critieria for d/c home   [x]   Recommend HH with 24 hour adult care   []   Benefit from additional acute PT session to address:        PHYSICAL THERAPY TREATMENT    Patient: Brandon Montoya. (57 y.o. 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 Patient ambulated 150 feet with RW, GB applied, CGA. Pt negotiated 5 stairs with handrails and CGA. Pt educated on icing, elevation, spinal precautions, positioning, home safety, home exercise program, and activity recommendations. Home health physical therapy is recommended upon discharge from hospital. Recommend 24 hour supervision/caregiver assistance upon d/c.    Progression toward goals:   [x]       Improving appropriately and progressing toward goals  []       Improving slowly and progressing toward goals  []       Not making progress toward goals and plan of care will be adjusted     PLAN:  Patient continues to benefit from skilled intervention to address the above  impairments.  Continue treatment per established plan of care.    Further Equipment Recommendations for Discharge:   Yes (RW has been provided and is in room)  Discharge Recommendation: Home health PT    AMPAC: 18/24    This AMPAC score should be considered in conjunction with interdisciplinary team recommendations to determine the most appropriate discharge setting. Patient's social support, diagnosis, medical stability, and prior level of function should also be taken into consideration.     SUBJECTIVE:   Patient stated Subjective: I feel a little better today    OBJECTIVE DATA SUMMARY:   Functional Mobility Training:  Bed Mobility:  Bed Mobility Training  Bed Mobility Training: Yes  Interventions: Tactile cues;Verbal cues;Demonstration  Supine to Sit: Stand-by assistance  Sit to Supine: Stand-by assistance  Transfers:  Psychologist, prison and probation services Training: Yes  Interventions: Verbal cues;Safety awareness training;Demonstration  Sit to Stand: Contact-guard assistance  Stand to Sit: Contact-guard assistance  Balance:  Balance  Sitting: Intact  Standing: Impaired;With support  Standing - Static: Fair  Standing - Dynamic: Fair  Ambulation/Gait Training:  Gait  Gait Training: Yes  Overall Level of Assistance: Contact-guard assistance  Distance (ft): 150 Feet  Assistive Device: Gait belt;Walker, rolling  Interventions: Demonstration;Verbal cues;Safety awareness training  Speed/Cadence: Slow  Step Length: Left shortened;Right shortened  Stance: Time  Gait Abnormalities: Antalgic;Decreased step clearance  Rail Use: Left  Stairs - Level of Assistance: Contact-guard assistance  Number of Stairs Trained: 5  Pain:  Pain level pre-treatment: 7/10  Pain level post-treatment: 7/10   Pain Intervention(s): Rest, Ice, Repositioning   Response to intervention: Nurse notified    Activity Tolerance:   Activity Tolerance: Patient tolerated evaluation without incident;Patient tolerated treatment well    After treatment:   []  Patient  left in no apparent distress sitting up in chair  [x]  Patient left in no apparent distress in bed  [x]  Call bell left within reach  [x]  Nursing notified  []  Caregiver present  []  Bed alarm activated  []  SCDs applied      COMMUNICATION/EDUCATION:   Patient Education  Education Given To: Patient  Education Provided: Role of Therapy;Plan of Care;Home Exercise Program;Transfer Training;Equipment;Fall Prevention Strategies  Education Method: Demonstration;Verbal  Barriers to Learning: None  Education Outcome: Verbalized understanding;Continued education needed      [employer], PT  Minutes: 34

## 2022-08-30 NOTE — Progress Notes (Signed)
VSS, afeb  Neuro intact  Dressing c/d/i  Mild improvement in pain with dilaudid    Plan:  DC home today when clears PT/OT and pain controlled

## 2022-09-02 LAB — CULTURE, ANAEROBIC: Culture: NO GROWTH

## 2022-09-04 LAB — CULTURE, WOUND (WITH GRAM STAIN)
Culture: NO GROWTH
Culture: NO GROWTH
Gram Stain: NONE SEEN
Gram Stain: NONE SEEN
Gram Stain: NONE SEEN
Gram Stain: NONE SEEN

## 2022-09-06 LAB — CULTURE, ANAEROBIC: Culture: NO GROWTH

## 2022-09-20 NOTE — Procedures (Signed)
 Interventional Radiology Brief Procedure Note    Procedure Performed: PICC Line Placement    Pre-Operative Diagnosis: Need for long term IV antibiotics    Post-Operative Diagnosis:  Same    Performed By: Vernell Panning, PA-C    Attending Radiologist: Eligio Diesel, MD    Procedure Date: 09/20/2022    Supervision: General    Type of Anesthesia: Local     Procedure:  Upper extremity double lumen Power PICC Line.  Ultrasound and fluoroscopic guidance used, images were captured and saved.  Using a micropuncture needle & under direct ultrasound guidance the Left Basilic vein was accessed with a 21 gauge needle followed by a sheathed needle dilator. Catheter cut to appropriate length with its tip located at the junction of the SVC & RA. Total cathter length was 47 cm.    Cap------------------------>YES   Mask---------------------->YES   Sterile gown------------->YES   Sterile gloves----------->YES   Hand hygeine---------->YES   Antiseptic prep--------->YES   Large sterile drape---->YES     Estimated Blood Loss:  None     Complications: None    Discharge Plan: Home . Access ready for use.    Vernell Panning, PA-C  [facility]  Vascular & Interventional Radiology  09/20/2022     Angio/IR at [facility]: [phone]  (after hours [phone])  Angio/IR at [facility]:    [phone]  (after hours [phone])  Angio/IR at [facility]:      [phone]  (after hours [phone])

## 2022-09-20 NOTE — Unmapped External Note (Signed)
 Patient Education  Table of Contents  PICC Insertion, Care After    To view videos and all your education online visit,  [email or web address].elsevier.com/[email or web address]  or scan this QR code with your smartphone.  Access to this content will expire in one year.  Clinician Note  Please follow up with your ordering doctor for continued care and follow up.  If you have any signs or symptoms of infection, as noted in the discharge instructions, or if you have any questions or concerns about your PICC line, please call Vernell Panning, PA at [phone].  PICC Insertion, Care After  The following information offers guidance on how to care for yourself after your procedure. Your health care provider may also give you more specific instructions. If you have problems or questions, contact your health care provider.  What can I expect after the procedure?  After the procedure, it is common to have mild discomfort and bruising at your insertion site.  Follow these instructions at home:  Bathing    Do not take baths, swim, or use a hot tub until your health care provider approves. Ask your health care provider if you may take showers. You may only be allowed to take sponge baths.  Keep the bandage (dressing) dry. If it gets wet, it needs to be changed right away to help prevent infection.  Activity    Rest as told by your health care provider. Ask your health care provider when you can return to your normal activities.  If your PICC is near or at the bend of your elbow, avoid activity with repeated motion at the elbow.  Do not lift anything that is heavier than 10 lb (4.5 kg), or the limit that you are told, until your health care provider says that it is safe.  If you use a crutch, do not use it with the arm on the same side as your PICC. You may need to use a walker instead.  Avoid any activity that requires great effort as told by your health care provider.  General instructions  If you were given a sedative during the procedure, it can  affect you for several hours. Do not drive or operate machinery until your health care provider says that it is safe.  Take over-the-counter and prescription medicines only as told by your health care provider.  Flush the PICC as told by your health care provider. Let your health care provider know right away if the PICC is hard to flush or does not flush. Do not use force to flush the PICC.  Do not use any products that contain nicotine or tobacco. These products include cigarettes, chewing tobacco, and vaping devices, such as e-cigarettes. These can delay healing after surgery. If you need help quitting, ask your health care provider.  Check your insertion site every day for signs of infection. Check for:  More redness, swelling, or pain.  Fluid or blood.  Warmth.  Pus or a bad smell.  Keep all follow-up visits. This is important. You will need to have your PICC dressing changed at least once a week.  Contact a health care provider if:  You have pain in your arm, ear, face, or teeth.  You have a fever or chills.  You have more redness, swelling, or pain around your insertion site.  You have fluid or blood coming from your insertion site.  Your insertion site feels warm to the touch.  You have pus or a bad smell coming from your insertion  site.  Your vein feels hard and raised like a cord under the skin around your insertion site.  Your PICC dressing has gotten wet.  Your PICC dressing is coming off. Tape it on until it can be changed.  Get help right away if:  You have problems with the PICC. These include if your PICC:  Is accidentally pulled all the way out. If this happens, cover the insertion site with a gauze dressing. Do not throw the PICC away. Your health care provider will need to check it to be sure the entire catheter came out.  Cannot be flushed, is hard to flush, or leaks around the insertion site when flushed.  Was tugged or pulled and has partially come out. Do not push the PICC back in.  Makes a  flushing sound when flushed.  Appears to have a hole or tear.  You feel your heart racing or skipping beats, or you have chest pain.  You have swelling, redness, warmth, or pain in the arm or leg where the PICC is placed.  You have a red streak going up your arm that starts under your PICC dressing.  You have numbness or tingling in your fingers, hand, or arm.  These symptoms may be an emergency. Get help right away. Call 911.  Do not wait to see if the symptoms will go away.  Do not drive yourself to the hospital.  Summary  After your procedure, it is common to have mild discomfort and bruising at your insertion site.  Do not lift anything that is heavier than 10 lb (4.5 kg), or the limit that you are told, until your health care provider says that it is safe.  Flush the PICC as told by your health care provider. Tell your health care provider right away if the PICC is hard to flush or does not flush. Do not use force to flush the PICC.  Check your insertion site every day for signs of infection.  This information is not intended to replace advice given to you by your health care provider. Make sure you discuss any questions you have with your health care provider.  Document Released: 2012-10-14 Document Updated: 2020-07-12 Document Reviewed: 2020-07-12  Elsevier Patient Education  2024 [employer].

## 2022-09-30 NOTE — Unmapped External Note (Signed)
 Patient Education  Table of Contents  Dalbavancin Injection    To view videos and all your education online visit,  [email or web address].elsevier.[email or web address]  or scan this QR code with your smartphone.  Access to this content will expire in one year.  Dalbavancin Injection  What is this medication?  DALBAVANCIN (DAL ba fleeta sin) treats infections caused by bacteria. It belongs to a group of medications called antibiotics. It will not treat colds, the flu, or infections caused by viruses.  This medicine may be used for other purposes; ask your health care provider or pharmacist if you have questions.  COMMON BRAND NAME(S): DALVANCE  What should I tell my care team before I take this medication?  They need to know if you have any of these conditions:  Kidney disease  Liver disease  Stomach or intestine problems, such as colitis  An unusual or allergic reaction to dalbavancin, other medications, foods, dyes, or preservatives  Pregnant or trying to get pregnant  Breast-feeding  How should I use this medication?  This medication is infused into a vein. It is usually given by your care team in a hospital or clinic setting. It may also be given at home.  If you get this medication at home, you will be taught how to prepare and give it. Use exactly as directed. Take it as directed on the prescription label at the same time every day. Take all of this medication unless your care team tells you to stop it early. Keep taking it even if you think you are better.  It is important that you put your used needles and syringes in a special sharps container. Do not put them in a trash can. If you do not have a sharps container, call your pharmacist or care team to get one.  Talk to your care team about the use of this medication in children. While it may be prescribed for children as young as newborns for selected conditions, precautions do apply.  Overdosage: If you think you have taken too much of this medicine contact a poison control  center or emergency room at once.  NOTE: This medicine is only for you. Do not share this medicine with others.  What if I miss a dose?  If you get this medication at the hospital or clinic: It is important not to miss your dose. Call your care team if you are unable to keep an appointment.  If you give yourself this medication at home: If you miss a dose, take it as soon as you can. If it is almost time for your next dose, take only that dose. Do not take double or extra doses. Call your care team with questions.  What may interact with this medication?  Estrogen and progestin hormones  This list may not describe all possible interactions. Give your health care provider a list of all the medicines, herbs, non-prescription drugs, or dietary supplements you use. Also tell them if you smoke, drink alcohol, or use illegal drugs. Some items may interact with your medicine.  What should I watch for while using this medication?  Tell your care team if your symptoms do not start to get better or if they get worse.  Do not treat diarrhea with over the counter products. Contact your care team if you have diarrhea that lasts more than 2 days or if it is severe and watery.  What side effects may I notice from receiving this medication?  Side effects that you should report  to your care team as soon as possible:  Allergic reactions--skin rash, itching, hives, swelling of the face, lips, tongue, or throat  Flushing, mostly over the face, neck, and chest, during injection  Infusion reactions--chest pain, shortness of breath or trouble breathing, feeling faint or lightheaded  Severe diarrhea, fever  Unusual vaginal discharge, itching, or odor  Side effects that usually do not require medical attention (report to your care team if they continue or are bothersome):  Diarrhea  Headache  Nausea  This list may not describe all possible side effects. Call your doctor for medical advice about side effects. You may report side effects to  FDA at 1-800-FDA-1088.  Where should I keep my medication?  Keep out of the reach of children and pets.  If you are using this medication at home, you will be instructed on how to store this medication. Get rid of any unused medication after the expiration date.  To get rid of medications that are no longer needed or have expired:  Take the medication to a medication take-back program. Check with your pharmacy or law enforcement to find a location.  If you cannot return the medication, ask your pharmacist or care team how to get rid of this medication safely.  NOTE: This sheet is a summary. It may not cover all possible information. If you have questions about this medicine, talk to your doctor, pharmacist, or health care provider.   2024 Elsevier/Gold Standard (2021-04-17)

## 2022-09-30 NOTE — Progress Notes (Signed)
 Patient monitored for 30 mins post Dalvance infusion.  No adverse reactions observed.

## 2022-09-30 NOTE — Unmapped External Note (Signed)
 Patient Education  Table of Contents  Dalbavancin Injection    To view videos and all your education online visit,  https://pe.elsevier.com/[email or web address]  or scan this QR code with your smartphone.  Access to this content will expire in one year.  Dalbavancin Injection  What is this medication?  DALBAVANCIN (DAL ba fleeta sin) treats infections caused by bacteria. It belongs to a group of medications called antibiotics. It will not treat colds, the flu, or infections caused by viruses.  This medicine may be used for other purposes; ask your health care provider or pharmacist if you have questions.  COMMON BRAND NAME(S): DALVANCE  What should I tell my care team before I take this medication?  They need to know if you have any of these conditions:  Kidney disease  Liver disease  Stomach or intestine problems, such as colitis  An unusual or allergic reaction to dalbavancin, other medications, foods, dyes, or preservatives  Pregnant or trying to get pregnant  Breast-feeding  How should I use this medication?  This medication is infused into a vein. It is usually given by your care team in a hospital or clinic setting. It may also be given at home.  If you get this medication at home, you will be taught how to prepare and give it. Use exactly as directed. Take it as directed on the prescription label at the same time every day. Take all of this medication unless your care team tells you to stop it early. Keep taking it even if you think you are better.  It is important that you put your used needles and syringes in a special sharps container. Do not put them in a trash can. If you do not have a sharps container, call your pharmacist or care team to get one.  Talk to your care team about the use of this medication in children. While it may be prescribed for children as young as newborns for selected conditions, precautions do apply.  Overdosage: If you think you have taken too much of this medicine contact a poison control  center or emergency room at once.  NOTE: This medicine is only for you. Do not share this medicine with others.  What if I miss a dose?  If you get this medication at the hospital or clinic: It is important not to miss your dose. Call your care team if you are unable to keep an appointment.  If you give yourself this medication at home: If you miss a dose, take it as soon as you can. If it is almost time for your next dose, take only that dose. Do not take double or extra doses. Call your care team with questions.  What may interact with this medication?  Estrogen and progestin hormones  This list may not describe all possible interactions. Give your health care provider a list of all the medicines, herbs, non-prescription drugs, or dietary supplements you use. Also tell them if you smoke, drink alcohol, or use illegal drugs. Some items may interact with your medicine.  What should I watch for while using this medication?  Tell your care team if your symptoms do not start to get better or if they get worse.  Do not treat diarrhea with over the counter products. Contact your care team if you have diarrhea that lasts more than 2 days or if it is severe and watery.  What side effects may I notice from receiving this medication?  Side effects that you should report  to your care team as soon as possible:  Allergic reactions--skin rash, itching, hives, swelling of the face, lips, tongue, or throat  Flushing, mostly over the face, neck, and chest, during injection  Infusion reactions--chest pain, shortness of breath or trouble breathing, feeling faint or lightheaded  Severe diarrhea, fever  Unusual vaginal discharge, itching, or odor  Side effects that usually do not require medical attention (report to your care team if they continue or are bothersome):  Diarrhea  Headache  Nausea  This list may not describe all possible side effects. Call your doctor for medical advice about side effects. You may report side effects to  FDA at 1-800-FDA-1088.  Where should I keep my medication?  Keep out of the reach of children and pets.  If you are using this medication at home, you will be instructed on how to store this medication. Get rid of any unused medication after the expiration date.  To get rid of medications that are no longer needed or have expired:  Take the medication to a medication take-back program. Check with your pharmacy or law enforcement to find a location.  If you cannot return the medication, ask your pharmacist or care team how to get rid of this medication safely.  NOTE: This sheet is a summary. It may not cover all possible information. If you have questions about this medicine, talk to your doctor, pharmacist, or health care provider.   2024 Elsevier/Gold Standard (2021-04-17)

## 2022-11-20 ENCOUNTER — Encounter

## 2022-11-25 ENCOUNTER — Inpatient Hospital Stay
Admit: 2022-11-25 | Disposition: A | Discharge status: Home / Self Care | Payer: Medicare (Managed Care) | Source: Home / Self Care | Attending: Orthopaedic Surgery | Admitting: Orthopaedic Surgery | Primary: Family Medicine

## 2022-11-25 ENCOUNTER — Inpatient Hospital Stay
Admit: 2022-11-25 | Disposition: A | Discharge status: Home / Self Care | Payer: Medicare (Managed Care) | Source: Ambulatory Visit | Attending: Orthopaedic Surgery | Admitting: Orthopaedic Surgery | Primary: Family Medicine

## 2022-11-25 DIAGNOSIS — M87052 Idiopathic aseptic necrosis of left femur: Principal | ICD-10-CM

## 2022-12-02 ENCOUNTER — Encounter: Payer: MEDICARE | Attending: Family Medicine | Primary: Family Medicine

## 2022-12-03 ENCOUNTER — Encounter: Payer: MEDICARE | Attending: Family Medicine | Primary: Family Medicine

## 2022-12-06 ENCOUNTER — Encounter: Admit: 2022-12-06 | Admitting: Orthopaedic Surgery

## 2022-12-06 DIAGNOSIS — M961 Postlaminectomy syndrome, not elsewhere classified: Secondary | ICD-10-CM

## 2022-12-10 ENCOUNTER — Encounter: Admit: 2022-12-10 | Admitting: Orthopaedic Surgery

## 2022-12-10 DIAGNOSIS — M961 Postlaminectomy syndrome, not elsewhere classified: Secondary | ICD-10-CM

## 2022-12-12 ENCOUNTER — Ambulatory Visit
Admit: 2022-12-12 | Discharge: 2022-12-16 | Disposition: A | Discharge status: Home / Self Care | Payer: MEDICARE | Source: Ambulatory Visit | Attending: Orthopaedic Surgery | Admitting: Orthopaedic Surgery | Primary: Family Medicine

## 2022-12-12 DIAGNOSIS — M961 Postlaminectomy syndrome, not elsewhere classified: Secondary | ICD-10-CM

## 2022-12-20 ENCOUNTER — Encounter

## 2022-12-20 NOTE — Telephone Encounter (Signed)
 1st attempt no answer left message.

## 2022-12-20 NOTE — Telephone Encounter (Signed)
 Brandon Montoya called back he is aware of his 01/02/23 appt @ 9am with arrival @ 7am was told to check into main ent. Not on any blood thinners or aspirin was told RN will contact him 3 day's before appt.

## 2022-12-23 ENCOUNTER — Ambulatory Visit: Payer: MEDICARE | Attending: Family Medicine | Primary: Family Medicine

## 2022-12-30 NOTE — Telephone Encounter (Signed)
 Called patient to confirm Ct abscess drainage scheduled for Thursday, December 26th at 0900 (arrive at 0700). All pre-procedure instructions reviewed with patient. He does not take any blood thinners. All questions answered.

## 2023-01-02 ENCOUNTER — Inpatient Hospital Stay
Admit: 2023-01-02 | Disposition: A | Discharge status: Home / Self Care | Payer: MEDICARE | Attending: Orthopaedic Surgery | Admitting: Orthopaedic Surgery | Primary: Family Medicine

## 2023-01-02 VITALS — BP 109/75 | HR 77 | Temp 98.20000°F | Resp 16 | Ht 73.0 in | Wt 230.4 lb

## 2023-01-02 DIAGNOSIS — M961 Postlaminectomy syndrome, not elsewhere classified: Principal | ICD-10-CM

## 2023-01-02 LAB — BASIC METABOLIC PANEL
Anion Gap: 3 mmol/L (ref 3.0–18)
BUN/Creatinine Ratio: 11 — ABNORMAL LOW (ref 12–20)
BUN: 11 mg/dL (ref 7.0–18)
CO2: 30 mmol/L (ref 21–32)
Calcium: 8.8 mg/dL (ref 8.5–10.1)
Chloride: 106 mmol/L (ref 100–111)
Creatinine: 1.04 mg/dL (ref 0.6–1.3)
Est, Glom Filt Rate: 84 mL/min/{1.73_m2} (ref >60)
Glucose: 100 mg/dL — ABNORMAL HIGH (ref 74–99)
Potassium: 3.6 mmol/L (ref 3.5–5.5)
Sodium: 139 mmol/L (ref 136–145)

## 2023-01-02 LAB — CBC WITH AUTO DIFFERENTIAL
Basophils %: 1 % (ref 0–2)
Basophils Absolute: 0 10*3/uL (ref 0.0–0.1)
Eosinophils %: 3 % (ref 0–5)
Eosinophils Absolute: 0.1 10*3/uL (ref 0.0–0.4)
Hematocrit: 41.6 % (ref 36.0–48.0)
Hemoglobin: 13.4 g/dL (ref 13.0–16.0)
Immature Granulocytes %: 0 % (ref 0.0–0.5)
Immature Granulocytes Absolute: 0 10*3/uL (ref 0.00–0.04)
Lymphocytes %: 35 % (ref 21–52)
Lymphocytes Absolute: 1.4 10*3/uL (ref 0.9–3.6)
MCH: 29.6 pg (ref 24.0–34.0)
MCHC: 32.2 g/dL (ref 31.0–37.0)
MCV: 92 fL (ref 78.0–100.0)
MPV: 10.9 fL (ref 9.2–11.8)
Monocytes %: 11 % — ABNORMAL HIGH (ref 3–10)
Monocytes Absolute: 0.5 10*3/uL (ref 0.05–1.2)
Neutrophils %: 50 % (ref 40–73)
Neutrophils Absolute: 2 10*3/uL (ref 1.8–8.0)
Nucleated RBCs: 0 /100{WBCs}
Platelets: 153 10*3/uL (ref 135–420)
RBC: 4.52 M/uL (ref 4.35–5.65)
RDW: 12.8 % (ref 11.6–14.5)
WBC: 4 10*3/uL — ABNORMAL LOW (ref 4.6–13.2)
nRBC: 0 10*3/uL (ref 0.00–0.01)

## 2023-01-02 LAB — APTT: APTT: 27 s (ref 23.0–36.4)

## 2023-01-02 LAB — PROTIME-INR
INR: 1 (ref 0.9–1.1)
Protime: 13.7 s (ref 11.9–14.9)

## 2023-01-02 MED ORDER — FENTANYL CITRATE (PF) 100 MCG/2ML IJ SOLN
100 | INTRAMUSCULAR | Status: DC
Start: 2023-01-02 — End: 2023-01-02

## 2023-01-02 MED ORDER — LIDOCAINE HCL (PF) 1 % IJ SOLN
1 | INTRAMUSCULAR | Status: AC | PRN
Start: 2023-01-02 — End: 2023-01-02
  Administered 2023-01-02: 15:00:00 7 via INTRADERMAL

## 2023-01-02 MED ORDER — MIDAZOLAM HCL 2 MG/2ML IJ SOLN
2 | INTRAMUSCULAR | Status: DC
Start: 2023-01-02 — End: 2023-01-02

## 2023-01-02 MED ORDER — FENTANYL CITRATE (PF) 100 MCG/2ML IJ SOLN
100 | INTRAMUSCULAR | Status: AC | PRN
Start: 2023-01-02 — End: 2023-01-02
  Administered 2023-01-02: 15:00:00 50 via INTRAVENOUS
  Administered 2023-01-02: 15:00:00 25 via INTRAVENOUS

## 2023-01-02 MED ORDER — MIDAZOLAM HCL (PF) 2 MG/2ML IJ SOLN
2 | INTRAMUSCULAR | Status: AC | PRN
Start: 2023-01-02 — End: 2023-01-02
  Administered 2023-01-02: 15:00:00 .5 via INTRAVENOUS
  Administered 2023-01-02: 15:00:00 1 via INTRAVENOUS

## 2023-01-02 MED ORDER — SODIUM CHLORIDE 0.9 % IV SOLN
0.9 | INTRAVENOUS | Status: DC
Start: 2023-01-02 — End: 2023-01-02
  Administered 2023-01-02: 12:00:00 via INTRAVENOUS

## 2023-01-02 MED ORDER — LIDOCAINE HCL 1% INJ (MIXTURES ONLY)
1 | INTRAMUSCULAR | Status: DC
Start: 2023-01-02 — End: 2023-01-02

## 2023-01-02 MED FILL — XYLOCAINE-MPF 1 % IJ SOLN: 1 % | INTRAMUSCULAR | Qty: 30

## 2023-01-02 MED FILL — MIDAZOLAM HCL 2 MG/2ML IJ SOLN: 2 MG/ML | INTRAMUSCULAR | Qty: 2

## 2023-01-02 MED FILL — SODIUM CHLORIDE 0.9 % IV SOLN: 0.9 % | INTRAVENOUS | Qty: 1000

## 2023-01-02 MED FILL — FENTANYL CITRATE (PF) 100 MCG/2ML IJ SOLN: 100 MCG/2ML | INTRAMUSCULAR | Qty: 2

## 2023-01-02 NOTE — H&P (Signed)
 OUTPATIENT HISTORY AND PHYSICAL      Today 01/02/2023     Indication/Symptoms:   Brandon Montoya. is a 57 y.o. male here for lumbar fluid collection aspiration/drainage hx of laminectomy     Current Meds:    Prior to Admission medications    Medication

## 2023-01-02 NOTE — H&P (Signed)
 History and Physical    Patient: Brandon Montoya.           Sex: male       DOA: 01/02/2023  Date of Birth:  Sep 20, 1965      Age:  57 y.o.     LOS:  LOS: 0 days        HPI:     Brandon Montoya. is a 57 y.o. male with thoracolumbars spine fluid colle

## 2023-01-02 NOTE — Discharge Instructions (Addendum)
 DISCHARGE SUMMARY from Nurse    PATIENT INSTRUCTIONS:    After general anesthesia or intravenous sedation, for 24 hours or while taking prescription Narcotics:  Limit your activities  Do not drive and operate hazardous machinery  Do not make important pers

## 2023-01-02 NOTE — Progress Notes (Signed)
 TRANSFER - OUT REPORT:    Verbal report given to Lafonda Mosses, RN (name) on Brandon Montoya. being transferred to PACU (unit) for routine post-op       Report consisted of patient's Situation, Background, Assessment and   Recommendations(SBAR).     Information f

## 2023-01-02 NOTE — Procedures (Signed)
 RADIOLOGY POST PROCEDURE NOTE     January 02, 2023       10:27 AM     Preoperative Diagnosis:   Posterior thoracolumbar spine fluid collection.    Postoperative Diagnosis:  Same.    Operator:  Dr. Larry Sierras    Assistant:  None.    Type of Anesthesia: 1

## 2023-01-03 LAB — CULTURE, BODY FLUID (WITH GRAM STAIN): Culture: NO GROWTH

## 2023-01-09 LAB — CULTURE, BODY FLUID (WITH GRAM STAIN)
Culture: NO GROWTH
Gram Stain: NONE SEEN

## 2023-01-09 LAB — CULTURE, ANAEROBIC: Culture: NO GROWTH

## 2023-02-24 ENCOUNTER — Encounter

## 2023-02-24 ENCOUNTER — Inpatient Hospital Stay: Admit: 2023-02-24 | Payer: MEDICARE | Primary: Family Medicine

## 2023-02-24 ENCOUNTER — Inpatient Hospital Stay: Primary: Family Medicine

## 2023-02-24 ENCOUNTER — Inpatient Hospital Stay: Admit: 2023-02-24 | Discharge: 2023-03-04 | Payer: MEDICARE | Primary: Family Medicine

## 2023-02-24 DIAGNOSIS — M87051 Idiopathic aseptic necrosis of right femur: Secondary | ICD-10-CM

## 2023-02-24 DIAGNOSIS — Z01818 Encounter for other preprocedural examination: Secondary | ICD-10-CM

## 2023-02-24 LAB — COMPREHENSIVE METABOLIC PANEL
ALT: 11 U/L — ABNORMAL LOW (ref 16–61)
AST: 11 U/L (ref 10–38)
Albumin/Globulin Ratio: 1.1 (ref 0.8–1.7)
Albumin: 3.7 g/dL (ref 3.4–5.0)
Alk Phosphatase: 138 U/L — ABNORMAL HIGH (ref 45–117)
Anion Gap: 5 mmol/L (ref 3.0–18)
BUN/Creatinine Ratio: 11 — ABNORMAL LOW (ref 12–20)
BUN: 12 mg/dL (ref 7.0–18)
CO2: 30 mmol/L (ref 21–32)
Calcium: 9 mg/dL (ref 8.5–10.1)
Chloride: 102 mmol/L (ref 100–111)
Creatinine: 1.1 mg/dL (ref 0.6–1.3)
Est, Glom Filt Rate: 79 mL/min/{1.73_m2} (ref >60)
Globulin: 3.5 g/dL (ref 2.0–4.0)
Glucose: 91 mg/dL (ref 74–99)
Potassium: 4.2 mmol/L (ref 3.5–5.5)
Sodium: 137 mmol/L (ref 136–145)
Total Bilirubin: 0.3 mg/dL (ref 0.2–1.0)
Total Protein: 7.2 g/dL (ref 6.4–8.2)

## 2023-02-24 LAB — CBC WITH AUTO DIFFERENTIAL
Basophils %: 0.7 % (ref 0.0–2.0)
Basophils Absolute: 0.03 10*3/uL (ref 0.00–0.10)
Eosinophils %: 1.6 % (ref 0.0–5.0)
Eosinophils Absolute: 0.07 10*3/uL (ref 0.00–0.40)
Hematocrit: 43.9 % (ref 36.0–48.0)
Hemoglobin: 14.3 g/dL (ref 13.0–16.0)
Immature Granulocytes %: 0 % (ref 0.0–0.5)
Immature Granulocytes Absolute: 0 10*3/uL (ref 0.00–0.04)
Lymphocytes %: 28.3 % (ref 21.0–52.0)
Lymphocytes Absolute: 1.22 10*3/uL (ref 0.90–3.30)
MCH: 31.6 pg (ref 24.0–34.0)
MCHC: 32.6 g/dL (ref 31.0–37.0)
MCV: 96.9 fL (ref 78.0–100.0)
MPV: 11 fL (ref 9.2–11.8)
Monocytes %: 10.9 % — ABNORMAL HIGH (ref 3.0–10.0)
Monocytes Absolute: 0.47 10*3/uL (ref 0.05–1.20)
Neutrophils %: 58.5 % (ref 40.0–73.0)
Neutrophils Absolute: 2.52 10*3/uL (ref 1.80–8.00)
Nucleated RBCs: 0 /100{WBCs}
Platelets: 145 10*3/uL (ref 135–420)
RBC: 4.53 M/uL (ref 4.35–5.65)
RDW: 13.4 % (ref 11.6–14.5)
WBC: 4.3 10*3/uL — ABNORMAL LOW (ref 4.6–13.2)
nRBC: 0 10*3/uL (ref 0.00–0.01)

## 2023-02-24 LAB — URINALYSIS
Bilirubin, Urine: NEGATIVE
Blood, Urine: NEGATIVE
Glucose, Ur: NEGATIVE mg/dL
Ketones, Urine: NEGATIVE mg/dL
Leukocyte Esterase, Urine: NEGATIVE
Nitrite, Urine: NEGATIVE
Protein, UA: NEGATIVE mg/dL
Specific Gravity, UA: 1.01 (ref 1.005–1.030)
Urobilinogen, Urine: 0.2 U/dL (ref 0.2–1.0)
pH, Urine: 8.5 — ABNORMAL HIGH (ref 5.0–8.0)

## 2023-02-24 LAB — PROTIME-INR
INR: 1 (ref 0.9–1.1)
Protime: 13.1 s (ref 11.9–14.9)

## 2023-02-24 LAB — SEDIMENTATION RATE: Sed Rate, Automated: 6 mm/h (ref 0–20)

## 2023-02-24 LAB — APTT: APTT: 26.5 s (ref 23.0–36.4)

## 2023-02-25 LAB — C-REACTIVE PROTEIN: CRP: 0.3 mg/dL (ref 0–0.3)

## 2023-02-25 LAB — HEMOGLOBIN A1C W/O EAG: Hemoglobin A1C: 5.6 % (ref 4.2–5.6)

## 2023-02-25 NOTE — Telephone Encounter (Signed)
 Last seen 08/21/2022   Last labs 02/24/2023  Last filled no known provider  Next appointment Visit date not found     Lab Results   Component Value Date    NA 137 02/24/2023    K 4.2 02/24/2023    CL 102 02/24/2023    CO2 30 02/24/2023    BUN 12 02/24/2023    CREATININE 1.10 02/24/2023    GLUCOSE 91 02/24/2023    CALCIUM 9.0 02/24/2023    BILITOT 0.3 02/24/2023    ALKPHOS 138 (H) 02/24/2023    AST 11 02/24/2023    ALT 11 (L) 02/24/2023    LABGLOM 79 02/24/2023    AGRATIO 1.3 12/18/2021    GLOB 3.5 02/24/2023

## 2023-02-26 LAB — EKG 12-LEAD
Atrial Rate: 74 {beats}/min
Diagnosis: NORMAL
P Axis: 38 degrees
P-R Interval: 178 ms
Q-T Interval: 380 ms
QRS Duration: 82 ms
QTc Calculation (Bazett): 421 ms
R Axis: 38 degrees
T Axis: 57 degrees
Ventricular Rate: 74 {beats}/min

## 2023-02-26 LAB — CULTURE, URINE: Culture: NO GROWTH

## 2023-02-26 LAB — CULTURE, MRSA, SCREENING

## 2023-02-27 MED ORDER — PANTOPRAZOLE SODIUM 40 MG PO TBEC
40 | ORAL_TABLET | Freq: Every day | ORAL | 1 refills | Status: DC
Start: 2023-02-27 — End: 2023-12-10

## 2023-06-16 NOTE — Telephone Encounter (Signed)
 I have contacted this patient through My-Chart for the purpose of assisting in scheduling AWV.

## 2023-07-09 ENCOUNTER — Ambulatory Visit: Payer: Medicare (Managed Care) | Attending: Family Medicine | Primary: Family Medicine

## 2023-07-22 ENCOUNTER — Encounter: Payer: Medicare (Managed Care) | Attending: Family Medicine | Primary: Family Medicine

## 2023-11-27 ENCOUNTER — Encounter

## 2023-11-27 ENCOUNTER — Inpatient Hospital Stay: Payer: Medicare (Managed Care) | Primary: Family Medicine

## 2023-11-27 ENCOUNTER — Inpatient Hospital Stay: Admit: 2023-11-27 | Payer: Medicare (Managed Care) | Primary: Family Medicine

## 2023-11-27 DIAGNOSIS — G5602 Carpal tunnel syndrome, left upper limb: Principal | ICD-10-CM

## 2023-11-27 DIAGNOSIS — Z01812 Encounter for preprocedural laboratory examination: Secondary | ICD-10-CM

## 2023-11-27 NOTE — Periop Note (Signed)
 "PCP   Dr VEAR Peck is  not aware of the surgery. Instructed to bring Insurance card and ID on the day of surgery.  Advance directives status: none   Routed CBC with diff to [facility]      SURGICAL PRE-ADMISSION INSTRUCTIONS    Your Medication instructions:        Take or Use these medications the morning of surgery with a sip of water : clonazepam , fluoxetine ,atorvastatin , pantoprazole     Do Not Take or Use morning of surgery: (enter specific medications)    Follow MD instructions for pre-op management of: solifenacin            Follow surgeon's instructions for stopping NSAIDs (i.e. Ibuprofen/Motrin, Advil, BC powder, Aleve/Naproxen, Voltaren cream or tablet, Mobic/Meloxicam, Excedrin, Celebrex, Ketorolac /Toradol , etc). If no instructions provided stop NSAIDs 7 days prior to surgery.    Stop supplements and vitamins (except multivitamins) 2 weeks prior to surgery or now if surgery within 2 weeks of PAT Phone interview.    Patient denies being on any inhalers, eye drops, ointment/lotions/gels, patches, injectable medications, phentermine, supplements, vitamins, and herbals at this time. Instructed patient to please notify Preanesthesia Testing at [phone] with any medication changes after the PAT phone appointment.    General Day Surgery Instructions    Do NOT eat or drink anything, including water , ice chips, soda, candy, or gum after MIDNIGHT on the night before surgery, unless you have specific instructions from your Surgeon or Anesthesia Provider to do so.   Please follow instructions for cleaning skin with antibacterial bar soap kit 3 days prior to surgery as directed.    Please shower with antibacterial bar soap prior to arrival on day of surgery.  No smoking/vaping/chewing tobacco products 24 hours before surgery.  No recreational drugs for one week prior to the day of surgery.  Remove all jewelry/body piercing, nail polish, makeup (including mascara); no lotions, powders, deodorant, or  perfume/cologne/after shave.  Leave all valuables, including money/purse/wallet, jewelry, laptop, etc.. at home.  Bring durable medical equipment (DME) needed: none    Glasses  may be worn to the hospital.  They will be removed prior to surgery.  Call your doctor if you have symptoms of a cold or illness, rash, wounds, or open areas on your skin prior to surgery. Patient denies any symptoms of illnesses and no open skin areas/rashes/wounds at this time.  More than one visitor is allowed in the waiting room of the [facility] on the day of surgery; however, only 1 visitor is allowed to accompany you in the clinical areas.  AN ADULT MUST DRIVE YOU HOME AFTER OUTPATIENT SURGERY.   If you are having an OUTPATIENT procedure, please make arrangements for a responsible adult to be with you for 24 hours after your surgery.  If you are admitted to the hospital, you will be assigned to a bed after surgery is complete.  Please anticipate being here at the Hospital A MINIMUM of 5-6 hours on the Surgery Day. Please DO NOT make any other appointment for the day of surgery. The staff will inform you and your family if any delays occur on the day of surgery. Please ensure that your designated driver or ride remains on campus for the entire duration of your stay on the day of surgery. This is important for your safety and timely discharge.     Arrival time will be provided to you by your surgeon's office on the day before your procedure/surgery. Normally you will be asked to  arrive 2-3 Hours in advance of your procedure.    Patient verbalized understanding of instructions provided during pre-anesthesia phone interview.   Sent above surgical instructions to patient via MyChart message. Encouraged patient to check MyChart for the written surgery instructions.   "

## 2023-11-27 NOTE — Periop Note (Signed)
"        Pre-Admission Testing Algorithm for Identification of Special Population for Anesthesia Review                                                            Patient History Triggers That Require Anesthesia Review & Potential Clearance      [] Anesthesia Reaction History: Malignant Hyperthermia (including family hx of MH) and Pseudocholinesterase Deficiency  [] Arrhythmias (Significant) or Conduction abnormalities (e.g., A-Fib, A-Flutter, SVT, PSVT, VT, V-Fib, Bradycardia with HR < 40, Sick Sinus Syndrome, Bi/Trigeminy, PACs/PVCs and accessory pathway tachycardias such as Wolf-Parkinson-White)  [] Cardiac History: CHF, CAD, Heart Valve Disorders (Aortic Stenosis, Mitral Regurgitation and Atresia), Cardiomyopathy, Aneurysms, All Ischemia, LBBB, Septal Defects, New/Recent Infarcts and Angina, and Pacemakers/AICDs-  [] Pulmonary History: Severe COPD: Emphysema/Chronic Bronchitis (recent exacerbation within 6 weeks of surgery), Pulmonary Fibrosis, Pulmonary HTN, and on Home Oxygen  [] Recent URI within 30 days of surgery (for example: Covid, pneumonia, bronchitis, etc.)  [] Kidney History: Chronic Renal Failure, on Hemodialysis or Peritoneal Dialysis  [] Hepatic History: Liver Failure, Cirrhosis, Ascites  [] HgA1c > 8.0 or on insulin pump  [] Hgb < 10  [] CVA/Stroke History/Tia  [] Seizure/Epilepsy History  [] PVD/PAD  [] Head and Neck Cancers History: Chemotherapy/Radiation Treatment  [] Bleeding and Clotting Disorders: Hemophilia, Von Willebrand (DDAVP, Factor V Leiden, Factor Deficiencies Disorders, Thrombocytopenia, DIC, Sickle Cell Disease, Hereditary Hemorrhagic Telangiectasia (HHT) and Prothrombin Gene Mutation  [] Rare Diseases: Cystic Fibrosis, Muscular Dystrophies, Phenylketonuria (PKU), Achalasia, etc  [] Autoimmune Diseases(to include the following): Type-1 Diabetes, RA, Lupus, Multiple Sclerosis, Addison's and Graves's Diseases, Hashimoto's Thyroiditis, Myasthenia Gravis, Scleroderma, Sjogren's Syndrome, Pernicious  Anemia, Autoimmune Vasculitis  [] Transplant History: Organ or Bone Marrow   [x] Abnormal Labs: Elevated Creatinine, Elevated Liver Enzymes, Elevated and Low Potassium and Sodium, Low Platelets   [] Blood Clots History: DVT/PE (if treated with anticoagulation- request instructions for Pre-Op management)-Do Not Require Anesthesia Review  [] If BMI 50 or greater, then sent chart to anesthesia for review for possible airway check.      Patient meets criteria for Spec Pop- Notified Yevette/Dominique posting via email.   "

## 2023-11-28 LAB — FAX TO: Fax to Number: 7578733239

## 2023-11-28 NOTE — Telephone Encounter (Signed)
"-----   Message from Your Healthcare Team sent at 11/28/2023  3:36 PM EST -----  Regarding: ECC Referral Request  [facility] Referral Request    Reason for referral request: Specialty Provider    Specialist/Lab/Test patient is requesting (if known):Neurologist    Specialist Phone Number (if applicable):    Additional Information The patient is requesting for a referral to a neurologist  --------------------------------------------------------------------------------------------------------------------------    Relationship to Patient: Self     Call Back Information: OK to leave message on voicemail  Preferred Call Back Number: Phone [phone]  "

## 2023-12-01 NOTE — Telephone Encounter (Signed)
"  Patient needs appt for referral-message sent  "

## 2023-12-03 ENCOUNTER — Inpatient Hospital Stay: Payer: Medicare (Managed Care) | Attending: Orthopaedic Surgery

## 2023-12-03 MED ORDER — FENTANYL CITRATE (PF) 100 MCG/2ML IJ SOLN
100 | INTRAMUSCULAR | Status: AC
Start: 2023-12-03 — End: 2023-12-03

## 2023-12-03 MED ORDER — PROCHLORPERAZINE EDISYLATE 10 MG/2ML IJ SOLN
10 | Freq: Once | INTRAMUSCULAR | Status: DC | PRN
Start: 2023-12-03 — End: 2023-12-03

## 2023-12-03 MED ORDER — SODIUM CHLORIDE 0.9 % IV SOLN
0.9 | INTRAVENOUS | Status: DC | PRN
Start: 2023-12-03 — End: 2023-12-03

## 2023-12-03 MED ORDER — OXYCODONE-ACETAMINOPHEN 5-325 MG PO TABS
5-325 | ORAL_TABLET | Freq: Four times a day (QID) | ORAL | 0 refills | 7.00000 days | Status: AC | PRN
Start: 2023-12-03 — End: 2023-12-10
  Filled 2023-12-03: qty 18, 5d supply, fill #0

## 2023-12-03 MED ORDER — MIDAZOLAM HCL 2 MG/2ML IJ SOLN
2 | Freq: Once | INTRAMUSCULAR | Status: DC | PRN
Start: 2023-12-03 — End: 2023-12-03
  Administered 2023-12-03: 18:00:00 2 via INTRAVENOUS

## 2023-12-03 MED ORDER — PHENYLEPHRINE HCL 1 MG/10ML IV SOSY
1 | INTRAVENOUS | Status: AC
Start: 2023-12-03 — End: 2023-12-03

## 2023-12-03 MED ORDER — NORMAL SALINE FLUSH 0.9 % IV SOLN
0.9 | Freq: Two times a day (BID) | INTRAVENOUS | Status: DC
Start: 2023-12-03 — End: 2023-12-03

## 2023-12-03 MED ORDER — LACTATED RINGERS IV SOLN
INTRAVENOUS | Status: DC
Start: 2023-12-03 — End: 2023-12-03
  Administered 2023-12-03 (×2): via INTRAVENOUS

## 2023-12-03 MED ORDER — PROPOFOL 200 MG/20ML IV EMUL
200 | INTRAVENOUS | Status: AC
Start: 2023-12-03 — End: 2023-12-03

## 2023-12-03 MED ORDER — NORMAL SALINE FLUSH 0.9 % IV SOLN
0.9 | INTRAVENOUS | Status: DC | PRN
Start: 2023-12-03 — End: 2023-12-03

## 2023-12-03 MED ORDER — CEFAZOLIN 2000 MG IN 20 ML SWFI IV SYRINGE (PREMIX)
Status: AC
Start: 2023-12-03 — End: 2023-12-03
  Administered 2023-12-03: 18:00:00 2000 mg via INTRAVENOUS

## 2023-12-03 MED ORDER — KETOROLAC TROMETHAMINE 30 MG/ML IJ SOLN
30 | Freq: Once | INTRAMUSCULAR | Status: DC | PRN
Start: 2023-12-03 — End: 2023-12-03
  Administered 2023-12-03: 19:00:00 30 via INTRAVENOUS

## 2023-12-03 MED ORDER — OXYCODONE HCL 5 MG PO TABS
5 | ORAL | Status: DC | PRN
Start: 2023-12-03 — End: 2023-12-03

## 2023-12-03 MED ORDER — LIDOCAINE HCL (PF) 1 % IJ SOLN
1 | INTRAMUSCULAR | Status: DC | PRN
Start: 2023-12-03 — End: 2023-12-03
  Administered 2023-12-03: 19:00:00 5 via INTRADERMAL

## 2023-12-03 MED ORDER — LABETALOL HCL 5 MG/ML IV SOLN
5 | INTRAVENOUS | Status: DC | PRN
Start: 2023-12-03 — End: 2023-12-03

## 2023-12-03 MED ORDER — PHENYLEPHRINE HCL 1 MG/10ML IV SOSY
1 | Freq: Once | INTRAVENOUS | Status: DC | PRN
Start: 2023-12-03 — End: 2023-12-03
  Administered 2023-12-03: 19:00:00 200 via INTRAVENOUS
  Administered 2023-12-03: 19:00:00 150 via INTRAVENOUS
  Administered 2023-12-03: 19:00:00 50 via INTRAVENOUS
  Administered 2023-12-03: 19:00:00 150 via INTRAVENOUS
  Administered 2023-12-03 (×2): 100 via INTRAVENOUS
  Administered 2023-12-03: 19:00:00 150 via INTRAVENOUS
  Administered 2023-12-03: 18:00:00 100 via INTRAVENOUS

## 2023-12-03 MED ORDER — DEXAMETHASONE SODIUM PHOSPHATE 4 MG/ML IJ SOLN
4 | Freq: Once | INTRAMUSCULAR | Status: DC | PRN
Start: 2023-12-03 — End: 2023-12-03
  Administered 2023-12-03: 18:00:00 4 via INTRAVENOUS

## 2023-12-03 MED ORDER — MEPERIDINE HCL 50 MG/ML IJ SOLN
50 | INTRAMUSCULAR | Status: DC | PRN
Start: 2023-12-03 — End: 2023-12-03

## 2023-12-03 MED ORDER — ONDANSETRON HCL 4 MG/2ML IJ SOLN
4 | Freq: Once | INTRAMUSCULAR | Status: DC | PRN
Start: 2023-12-03 — End: 2023-12-03
  Administered 2023-12-03: 18:00:00 4 via INTRAVENOUS

## 2023-12-03 MED ORDER — SODIUM CHLORIDE 0.9 % IR SOLN
0.9 | Status: AC | PRN
Start: 2023-12-03 — End: 2023-12-03
  Administered 2023-12-03: 19:00:00 500

## 2023-12-03 MED ORDER — FENTANYL CITRATE (PF) 100 MCG/2ML IJ SOLN
100 | INTRAMUSCULAR | Status: DC | PRN
Start: 2023-12-03 — End: 2023-12-03

## 2023-12-03 MED ORDER — NALOXONE HCL 0.4 MG/ML IJ SOLN
0.4 | INTRAMUSCULAR | Status: DC | PRN
Start: 2023-12-03 — End: 2023-12-03

## 2023-12-03 MED ORDER — MIDAZOLAM HCL (PF) 2 MG/2ML IJ SOLN
2 | Freq: Once | INTRAMUSCULAR | Status: DC | PRN
Start: 2023-12-03 — End: 2023-12-03

## 2023-12-03 MED ORDER — PROPOFOL 200 MG/20ML IV EMUL
200 | Freq: Once | INTRAVENOUS | Status: DC | PRN
Start: 2023-12-03 — End: 2023-12-03
  Administered 2023-12-03: 19:00:00 50 via INTRAVENOUS
  Administered 2023-12-03: 18:00:00 200 via INTRAVENOUS

## 2023-12-03 MED ORDER — MIDAZOLAM HCL 2 MG/2ML IJ SOLN
2 | INTRAMUSCULAR | Status: AC
Start: 2023-12-03 — End: 2023-12-03

## 2023-12-03 MED ORDER — KETOROLAC TROMETHAMINE 30 MG/ML IJ SOLN
30 | INTRAMUSCULAR | Status: AC
Start: 2023-12-03 — End: 2023-12-03

## 2023-12-03 MED ORDER — GLYCOPYRROLATE 0.4 MG/2ML IJ SOLN
0.4 | Freq: Once | INTRAMUSCULAR | Status: DC | PRN
Start: 2023-12-03 — End: 2023-12-03
  Administered 2023-12-03: 18:00:00 .2 via INTRAVENOUS

## 2023-12-03 MED ORDER — CHLORHEXIDINE GLUCONATE 4 % EX SOLN
4 | Freq: Every day | CUTANEOUS | Status: DC | PRN
Start: 2023-12-03 — End: 2023-12-03

## 2023-12-03 MED ORDER — DIPHENHYDRAMINE HCL 50 MG/ML IJ SOLN
50 | Freq: Once | INTRAMUSCULAR | Status: AC | PRN
Start: 2023-12-03 — End: 2023-12-03
  Administered 2023-12-03: 20:00:00 12.5 mg via INTRAVENOUS

## 2023-12-03 MED ORDER — ONDANSETRON HCL 4 MG/2ML IJ SOLN
4 | INTRAMUSCULAR | Status: AC
Start: 2023-12-03 — End: 2023-12-03

## 2023-12-03 MED ORDER — ONDANSETRON HCL 4 MG/2ML IJ SOLN
4 | Freq: Once | INTRAMUSCULAR | Status: DC | PRN
Start: 2023-12-03 — End: 2023-12-03

## 2023-12-03 MED ORDER — LIDOCAINE HCL (PF) 2 % IJ SOLN
2 | INTRAMUSCULAR | Status: AC
Start: 2023-12-03 — End: 2023-12-03

## 2023-12-03 MED ORDER — LIDOCAINE HCL 1% INJ (MIXTURES ONLY)
1 | INTRAMUSCULAR | Status: AC
Start: 2023-12-03 — End: 2023-12-03

## 2023-12-03 MED ORDER — GLYCOPYRROLATE 0.4 MG/2ML IJ SOLN
0.4 | INTRAMUSCULAR | Status: AC
Start: 2023-12-03 — End: 2023-12-03

## 2023-12-03 MED ORDER — BUPIVACAINE HCL (PF) 0.5 % IJ SOLN
0.5 | INTRAMUSCULAR | Status: DC | PRN
Start: 2023-12-03 — End: 2023-12-03
  Administered 2023-12-03: 19:00:00 5 via INTRAMUSCULAR

## 2023-12-03 MED ORDER — DEXAMETHASONE SODIUM PHOSPHATE 4 MG/ML IJ SOLN
4 | INTRAMUSCULAR | Status: AC
Start: 2023-12-03 — End: 2023-12-03

## 2023-12-03 MED ORDER — LIDOCAINE HCL (PF) 2 % IJ SOLN
2 | Freq: Once | INTRAMUSCULAR | Status: DC | PRN
Start: 2023-12-03 — End: 2023-12-03
  Administered 2023-12-03: 18:00:00 50 via INTRAVENOUS

## 2023-12-03 MED ORDER — BUPIVACAINE HCL (PF) 0.5 % IJ SOLN
0.5 | INTRAMUSCULAR | Status: AC
Start: 2023-12-03 — End: 2023-12-03

## 2023-12-03 MED ORDER — HYDROMORPHONE HCL PF 1 MG/ML IJ SOLN
1 | INTRAMUSCULAR | Status: AC | PRN
Start: 2023-12-03 — End: 2023-12-03
  Administered 2023-12-03 (×4): 0.5 mg via INTRAVENOUS

## 2023-12-03 MED ORDER — FENTANYL CITRATE (PF) 100 MCG/2ML IJ SOLN
100 | Freq: Once | INTRAMUSCULAR | Status: DC | PRN
Start: 2023-12-03 — End: 2023-12-03
  Administered 2023-12-03 (×2): 50 via INTRAVENOUS

## 2023-12-03 MED FILL — LACTATED RINGERS IV SOLN: INTRAVENOUS | Qty: 1000 | Fill #0

## 2023-12-03 MED FILL — HIBICLENS 4 % EX SOLN: 4 % | CUTANEOUS | Qty: 118 | Fill #0

## 2023-12-03 MED FILL — KETOROLAC TROMETHAMINE 30 MG/ML IJ SOLN: 30 mg/mL | INTRAMUSCULAR | Qty: 1 | Fill #0

## 2023-12-03 MED FILL — HYDROMORPHONE HCL 1 MG/ML IJ SOLN: 1 mg/mL | INTRAMUSCULAR | Qty: 1 | Fill #0

## 2023-12-03 MED FILL — MIDAZOLAM HCL 2 MG/2ML IJ SOLN: 2 mg/mL | INTRAMUSCULAR | Qty: 2 | Fill #0

## 2023-12-03 MED FILL — CEFAZOLIN 2000 MG IN 20 ML SWFI IV SYRINGE (PREMIX): Qty: 2000 | Fill #0

## 2023-12-03 MED FILL — BD POSIFLUSH 0.9 % IV SOLN: 0.9 % | INTRAVENOUS | Qty: 40 | Fill #0

## 2023-12-03 MED FILL — PROPOFOL 200 MG/20ML IV EMUL: 200 MG/20ML | INTRAVENOUS | Qty: 20 | Fill #0

## 2023-12-03 MED FILL — PHENYLEPHRINE HCL (PRESSORS) 1 MG/10ML IV SOSY: 1 MG/0ML | INTRAVENOUS | Qty: 10 | Fill #0

## 2023-12-03 MED FILL — ONDANSETRON HCL 4 MG/2ML IJ SOLN: 4 MG/2ML | INTRAMUSCULAR | Qty: 2 | Fill #0

## 2023-12-03 MED FILL — FENTANYL CITRATE (PF) 100 MCG/2ML IJ SOLN: 100 MCG/2ML | INTRAMUSCULAR | Qty: 2 | Fill #0

## 2023-12-03 MED FILL — XYLOCAINE-MPF 2 % IJ SOLN: 2 % | INTRAMUSCULAR | Qty: 5 | Fill #0

## 2023-12-03 MED FILL — GLYCOPYRROLATE 0.4 MG/2ML IJ SOLN: 0.4 MG/2ML | INTRAMUSCULAR | Qty: 2 | Fill #0

## 2023-12-03 MED FILL — LIDOCAINE HCL (PF) 1 % IJ SOLN: 1 % | INTRAMUSCULAR | Qty: 30 | Fill #0

## 2023-12-03 MED FILL — SENSORCAINE-MPF 0.5 % IJ SOLN: 0.5 % | INTRAMUSCULAR | Qty: 30 | Fill #0

## 2023-12-03 MED FILL — DIPHENHYDRAMINE HCL 50 MG/ML IJ SOLN: 50 mg/mL | INTRAMUSCULAR | Qty: 1 | Fill #0

## 2023-12-03 MED FILL — DEXAMETHASONE SODIUM PHOSPHATE 4 MG/ML IJ SOLN: 4 mg/mL | INTRAMUSCULAR | Qty: 1 | Fill #0

## 2023-12-03 NOTE — Progress Notes (Signed)
 D/C instructions given to patient and caregiver.

## 2023-12-03 NOTE — H&P (Signed)
 "    [facility]  History and Physical Exam    Patient: Brandon Montoya. MRN: [id number]  SSN: [id number]    Date of Birth: 1965-05-22  Age: 58 y.o.  Sex: male      Subjective:      Chief Complaint: left upper extremity pain    History of Present Illness:  Patient complains of left upper extremity pain on the affected side and difficulty with daily activity. Pain is increased with increasing activity. Pain interferes with activities of daily living.    Past Medical History:   Diagnosis Date    Anxiety and depression 1994    on meds. was in [employer]    Arthritis 2019    hx bilateral knee surgery & revisions, cervical    Chronic pain     bilateral knees & lower back    Exercise tolerance finding 11/27/2023    can walk stairs withotu cx pain or sob    GERD (gastroesophageal reflux disease) 2002    Pantoprazole     History of blood transfusion     in [place]  with one of the knee replacement surgery    Hx of chest pain 2019    & syncope- resolved. no cardiologist    Hyperlipidemia 2019    Atorvastatin     Marijuana use     positive for Opioids 01/09/2022 no longer using    OSA (obstructive sleep apnea)     couldn't tolerated CPAP.    PTSD (post-traumatic stress disorder) 1994    on meds. was in [employer]. Saw therapist in the past    Wears reading eyeglasses      Past Surgical History:   Procedure Laterality Date    BACK SURGERY  2023    fx back -hardware (rods & screws)    CERVICAL FUSION N/A 05/20/2022    ANTERIOR CERVICAL DECOMPRESSION FUSION CERVICAL SIX/SEVEN, CERVICAL SEVEN/THORACIC ONE; C-ARM; [STRYKER SPINE] performed by Alaine Oneil NOVAK, MD at [facility] MAIN OR    CHOLECYSTECTOMY, LAPAROSCOPIC  07/03/2017    COLONOSCOPY  03/2023    ESOPHAGOGASTRODUODENOSCOPY  09/30/2018    KNEE ARTHROPLASTY Bilateral 2019    with revisions    LUMBAR FUSION N/A 08/28/2022    LUMBAR TWO/THREE FUSION EXTENSION THORACIC TEN- LUMBAR TWO FUSION WITH C-ARM **INPATIENT STATUS DUE TO MEDICARE** performed  by Alaine Oneil NOVAK, MD at [facility] MAIN OR    MASTECTOMY Right 2000    due to gynecomastia    XR MIDLINE EQUAL OR GREATER THAN 5 YEARS  09/20/2022    XR MIDLINE EQUAL OR GREATER THAN 5 YEARS 09/20/2022     Social History     Occupational History    Not on file   Tobacco Use    Smoking status: Former     Types: Cigarettes, Cigars     Start date: 2000     Passive exposure: Current    Smokeless tobacco: Never   Vaping Use    Vaping status: Never Used   Substance and Sexual Activity    Alcohol use: Yes     Comment: rarely    Drug use: Not Currently     Types: Marijuana Oda)     Comment: last used june/ July 2025    Sexual activity: Not on file     Prior to Admission medications   Medication Sig Start Date End Date Taking? Authorizing Provider   oxyCODONE -acetaminophen  (PERCOCET) 5-325 MG per tablet Take 1 tablet by mouth every 6 hours as  needed for Pain for up to 7 days. Intended supply: 7 days. Take lowest dose possible to manage pain Max Daily Amount: 4 tablets 12/03/23 12/10/23 Yes Claudene Tanda SAILOR, PA-C   montelukast  (SINGULAIR ) 10 MG tablet Take 1 tablet by mouth nightly 03/26/23  Yes [provider]   solifenacin (VESICARE) 5 MG tablet Take 2 tablets by mouth daily   Yes [provider]   pantoprazole  (PROTONIX ) 40 MG tablet TAKE 1 TABLET BY MOUTH EVERY DAY 02/27/23  Yes Mugaisi, Hesed N, MD   FLUoxetine  (PROZAC ) 20 MG capsule Take 4 capsules by mouth 2 times daily 03/05/18  Yes [provider]   atorvastatin  (LIPITOR) 20 MG tablet Take 1 tablet by mouth daily Indications: high cholesterol   Yes [provider]   clonazePAM  (KLONOPIN ) 1 MG tablet Take 2 tablets by mouth 2 times daily. Indications: Anxiety/depression/PTSD   Yes [provider]   QUEtiapine  (SEROQUEL ) 200 MG tablet Take 2 tablets by mouth at bedtime Indications: Anxiety/depression/PTSD    [provider]     History reviewed. No pertinent family history.    Allergies:   Allergies   Allergen Reactions     Eletriptan Anaphylaxis, Shortness Of Breath, Swelling, Angioedema, Hives and Other (See Comments)    Indomethacin Nausea And Vomiting        Review of Systems:  A comprehensive review of systems was negative.    Objective:       Physical Exam:  General:  Alert, oriented, pleasant, well-groomed  HEENT:  Normocephalic, atraumatic  Lungs:   Clear to auscultation  Heart:    Regular rate and rhythm  Abdomen:  Soft, non-tender  Extremities:   Exam consistent with carpal tunnel and inflammation in the first extensor compartment               Assessment:      Left upper extremity pain with exam and diagnostic procedures consistent with carpal tunnel and de quervain's tenosynovitis.     Plan:       Proceed with carpal tunnel release and first extensor compartment release.     The patient has failed conservative measures including anti-inflammatories, injections, activity modification, and a trial of physical therapy or external joint support which includes a home exercise program.    The various methods of treatment have been discussed with the patient and family. After consideration of risks, benefits, and other options for treatment, the patient has consented to surgical interventions. Questions were answered and preoperative teaching was done by myself and Dr. Stevan.     Signed By: Tanda SAILOR Claudene, PA-C     December 03, 2023       "

## 2023-12-03 NOTE — Anesthesia Pre Procedure (Addendum)
 "Department of Anesthesiology  Preprocedure Note       Name:  Brandon Montoya.   Age:  58 y.o.  DOB:  Nov 28, 1965                                          MRN:  [id number]         Date:  12/03/2023      Surgeon: Clotilde):  Stevan Yancy DASEN, MD    Procedure: Procedure(s):  LEFT CARPAL TUNNEL RELEASE AND LEFT DEQUERVAINS FIRST EXTENSOR RELEASE SPEC POP    Medications prior to admission:   Prior to Admission medications   Medication Sig Start Date End Date Taking? Authorizing Provider   montelukast  (SINGULAIR ) 10 MG tablet Take 1 tablet by mouth nightly 03/26/23  Yes [provider]   solifenacin (VESICARE) 5 MG tablet Take 2 tablets by mouth daily   Yes [provider]   pantoprazole  (PROTONIX ) 40 MG tablet TAKE 1 TABLET BY MOUTH EVERY DAY 02/27/23  Yes Mugaisi, Hesed N, MD   FLUoxetine  (PROZAC ) 20 MG capsule Take 4 capsules by mouth 2 times daily 03/05/18  Yes [provider]   atorvastatin  (LIPITOR) 20 MG tablet Take 1 tablet by mouth daily Indications: high cholesterol   Yes [provider]   clonazePAM  (KLONOPIN ) 1 MG tablet Take 2 tablets by mouth 2 times daily. Indications: Anxiety/depression/PTSD   Yes [provider]   QUEtiapine  (SEROQUEL ) 200 MG tablet Take 2 tablets by mouth at bedtime Indications: Anxiety/depression/PTSD    [provider]       Current medications:    Current Facility-Administered Medications   Medication Dose Route Frequency Provider Last Rate Last Admin    ceFAZolin  (ANCEF ) 2000 mg in sterile water  20 mL IV syringe  2,000 mg IntraVENous On Call to OR Stevan Yancy DASEN, MD        lactated ringers  infusion   IntraVENous Continuous Stevan Yancy DASEN, MD 125 mL/hr at 12/03/23 1126 New Bag at 12/03/23 1126    chlorhexidine  gluconate (ANTISEPTIC SKIN CLEANSER ) 4 % solution   Topical Daily PRN Stevan Yancy DASEN, MD           Allergies:    Allergies   Allergen Reactions    Eletriptan Anaphylaxis, Shortness Of Breath, Swelling, Angioedema, Hives  and Other (See Comments)    Indomethacin Nausea And Vomiting       Problem List:    Patient Active Problem List   Diagnosis Code    Abnormal glucose level R73.09    Allergic conjunctivitis H10.10    Pain in joint involving pelvic region and thigh M25.559    Aseptic necrosis of bone (HCC) 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.00    Aspiration pneumonia of lower lobe (HCC) J69.0    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    Bipolar 1 disorder (HCC) F31.9    Breast pain N64.4    Closed fracture of metacarpal bone S62.309A    Constipation K59.00    Enlarged prostate N40.0    Enthesopathy of hip region M76.899    Gallstone pancreatitis K85.10    Gastroesophageal reflux disease K21.9    History of arthroplasty of left knee S03.347    Hyperlipidemia E78.5    Hypertrophy of breast N62    Insomnia G47.00    Osteoarthritis of knee M17.9    Loss of consciousness (HCC) R40.20    Chronic low back pain M54.50, G89.29  Obesity with body mass index 30 or greater E66.9    Old anterior cruciate ligament disruption M23.50    Panic disorder without agoraphobia F41.0    Plantar fasciitis M72.2    Post-traumatic stress disorder, unspecified F43.10    Recurrent major depression F33.9    Sleep apnea, unspecified G47.30    Vitamin D deficiency E55.9    Artificial knee joint present Z96.659    Cervicalgia M54.2    Cocaine use F14.90    Elbow pain M25.529    Erectile dysfunction N52.9    History of colonic polyps Z86.0100    Hypotension I95.9    Prediabetes R73.03    Cervical radiculopathy due to osteoarthritis of spine M47.22    Major depressive disorder, 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 was in [employer]    Arthritis 2019    hx bilateral knee surgery & revisions, cervical    Chronic pain     bilateral knees & lower back    Exercise tolerance finding 11/27/2023    can walk stairs  withotu cx pain or sob    GERD (gastroesophageal reflux disease) 2002    Pantoprazole     History of blood transfusion     in [place]  with one of the knee replacement surgery    Hx of chest pain 2019    & syncope- resolved. no cardiologist    Hyperlipidemia 2019    Atorvastatin     Marijuana use     positive for Opioids 01/09/2022 no longer using    OSA (obstructive sleep apnea)     couldn't tolerated CPAP.    PTSD (post-traumatic stress disorder) 1994    on meds. was in [employer]. Saw therapist in the past    Wears reading eyeglasses        Past Surgical History:        Procedure Laterality Date    BACK SURGERY  2023    fx back -hardware (rods & screws)    CERVICAL FUSION N/A 05/20/2022    ANTERIOR CERVICAL DECOMPRESSION FUSION CERVICAL SIX/SEVEN, CERVICAL SEVEN/THORACIC ONE; C-ARM; [STRYKER SPINE] performed by Alaine Oneil NOVAK, MD at [facility] MAIN OR    CHOLECYSTECTOMY, LAPAROSCOPIC  07/03/2017    COLONOSCOPY  03/2023    ESOPHAGOGASTRODUODENOSCOPY  09/30/2018    KNEE ARTHROPLASTY Bilateral 2019    with revisions    LUMBAR FUSION N/A 08/28/2022    LUMBAR TWO/THREE FUSION EXTENSION THORACIC TEN- LUMBAR TWO FUSION WITH C-ARM **INPATIENT STATUS DUE TO MEDICARE** performed by Alaine Oneil NOVAK, MD at [facility] MAIN OR    MASTECTOMY Right 2000    due to gynecomastia    XR MIDLINE EQUAL OR GREATER THAN 5 YEARS  09/20/2022    XR MIDLINE EQUAL OR GREATER THAN 5 YEARS 09/20/2022       Social History:    Social History     Tobacco Use    Smoking status: Former     Types: Cigarettes, Cigars     Start date: 2000     Passive exposure: Current    Smokeless tobacco: Never   Substance Use Topics    Alcohol use: Yes     Comment: rarely  Counseling given: Not Answered      Vital Signs (Current):   Vitals:    12/03/23 1107   BP: 130/82   Pulse: 78   Resp: 16   Temp: 97.3 F (36.3 C)   TempSrc: Temporal   SpO2: 100%   Weight: 103.2 kg (227 lb 9.6 oz)   Height: 1.854 m (6' 1)                                               BP Readings from Last 3 Encounters:   12/03/23 130/82   01/02/23 109/75   08/30/22 118/76       NPO Status: Time of last liquid consumption: 2000                        Time of last solid consumption: 2000                        Date of last liquid consumption: 12/02/23                        Date of last solid food consumption: 12/02/23    BMI:   Wt Readings from Last 3 Encounters:   12/03/23 103.2 kg (227 lb 9.6 oz)   11/27/23 105.7 kg (233 lb)   01/02/23 104.5 kg (230 lb 6.4 oz)     Body mass index is 30.03 kg/m.    CBC:   Lab Results   Component Value Date/Time    WBC 5.2 11/27/2023 11:55 AM    RBC 4.37 11/27/2023 11:55 AM    RBC 4.52 02/20/2022 02:47 PM    HGB 13.9 11/27/2023 11:55 AM    HCT 40.8 11/27/2023 11:55 AM    MCV 93.4 11/27/2023 11:55 AM    RDW 12.4 11/27/2023 11:55 AM    PLT 121 11/27/2023 11:55 AM       CMP:   Lab Results   Component Value Date/Time    NA 140 11/27/2023 11:55 AM    K 3.7 11/27/2023 11:55 AM    CL 101 11/27/2023 11:55 AM    CO2 28 11/27/2023 11:55 AM    BUN 11 11/27/2023 11:55 AM    CREATININE 0.98 11/27/2023 11:55 AM    AGRATIO 1.3 12/18/2021 10:25 AM    LABGLOM >90 11/27/2023 11:55 AM    LABGLOM >60.0 02/20/2022 02:47 PM    GLUCOSE 94 11/27/2023 11:55 AM    GLUCOSE 89 02/20/2022 02:47 PM    CALCIUM  9.7 11/27/2023 11:55 AM    BILITOT 0.4 11/27/2023 11:55 AM    ALKPHOS 97 11/27/2023 11:55 AM    ALKPHOS 112 12/18/2021 10:25 AM    AST 22 11/27/2023 11:55 AM    ALT 12 11/27/2023 11:55 AM       POC Tests: No results for input(s): POCGLU, POCNA, POCK, POCCL, POCBUN, POCHEMO, POCHCT in the last 72 hours.    Coags:   Lab Results   Component Value Date/Time    PROTIME 13.1 02/24/2023 01:29 PM    INR 1.0 02/24/2023 01:29 PM    APTT 26.5 02/24/2023 01:29 PM       HCG (If Applicable): No results found for: PREGTESTUR, PREGSERUM, HCG, HCGQUANT     ABGs: No results found for: PHART, PO2ART, PCO2ART, HCO3ART, BEART, O2SATART  Type & Screen (If  Applicable):  Lab Results   Component Value Date    ABORH B POSITIVE 08/28/2022    LABANTI NEG 08/28/2022       Drug/Infectious Status (If Applicable):  No results found for: HIV, HEPCAB    COVID-19 Screening (If Applicable): No results found for: COVID19        Anesthesia Evaluation    Patient summary reviewed and Nursing notes reviewed      Airway:  Mallampati: III  TM distance: >3 FB   Neck ROM: limited    Mouth opening: > = 3 FB   Dental:  normal exam         Pulmonary:   normal exam   breath sounds clear to auscultation  (+) pneumonia:              sleep apnea: on noncompliant                        Cardiovascular:   Negative CV ROS  Exercise tolerance: good (>4 METS)            Rhythm: regular  Rate: normal                Neuro/Psych:    (+)   neuromuscular disease:        psychiatric history:                 GI/Hepatic/Renal:    (+)   GERD:                Endo/Other:  Negative Endo/Other ROS                    Abdominal:              Vascular:         Other Findings:            Anesthesia Plan      general     ASA 3       Induction: intravenous.      Anesthetic plan and risks discussed with patient and spouse.      Plan discussed with CRNA.    Attending anesthesiologist reviewed and agrees with Preprocedure content        GA vs MAC/Regional discussed. Pt declines regional and prefers GA        Toribio Slay, MD   12/03/2023            "

## 2023-12-03 NOTE — Periop Note (Signed)
"  TRANSFER - OUT REPORT:    Verbal report given to Charlies, RN on [name]  being transferred to [place], [place] for routine post-op       Report consisted of patient's Situation, Background, Assessment and   Recommendations(SBAR).     Information from the following report(s) Nurse Handoff Report, Adult Overview, Surgery Report, Intake/Output, MAR, Recent Results, and Med Rec Status was reviewed with the receiving nurse.           Lines:   Peripheral IV 12/03/23 Right;Ventral Forearm (Active)   Site Assessment Clean, dry & intact 12/03/23 1400   Line Status Infusing 12/03/23 1400   Phlebitis Assessment No symptoms 12/03/23 1400   Infiltration Assessment 0 12/03/23 1400   Alcohol Cap Used No 12/03/23 1130   Dressing Status Clean, dry & intact 12/03/23 1400   Dressing Type Transparent 12/03/23 1400   Dressing Intervention New 12/03/23 1130        Opportunity for questions and clarification was provided.      Patient transported with:  Registered Nurse        "

## 2023-12-03 NOTE — Anesthesia Postprocedure Evaluation (Signed)
"  Department of Anesthesiology  Postprocedure Note    Patient: Brandon Montoya.  MRN: [id number]  Birthdate: [birth year]  Date of evaluation: 12/03/2023    Procedure Summary       Date: 12/03/23 Room / Location: [facility] MAIN 05 / [facility] MAIN OR    Anesthesia Start: 1312 Anesthesia Stop: 1406    Procedure: LEFT CARPAL TUNNEL RELEASE AND LEFT DEQUERVAINS FIRST EXTENSOR RELEASE SPEC POP (Left: Hand) Diagnosis:       Carpal tunnel syndrome on left      Radial styloid tenosynovitis      (Carpal tunnel syndrome on left [G56.02])      (Radial styloid tenosynovitis [M65.4])    Surgeons: Stevan Yancy DASEN, MD Responsible Provider: Teddy Sieving, MD    Anesthesia Type: General ASA Status: 3            Anesthesia Type: General    Aldrete Phase I:      Aldrete Phase II:      Anesthesia Post Evaluation    Patient location during evaluation: PACU  Patient participation: complete - patient participated  Level of consciousness: awake and alert  Pain score: 0  Airway patency: patent  Nausea & Vomiting: no vomiting and no nausea  Cardiovascular status: blood pressure returned to baseline  Respiratory status: acceptable  Hydration status: euvolemic  Multimodal analgesia pain management approach  Pain management: adequate      No notable events documented.  "

## 2023-12-03 NOTE — H&P (Signed)
 "        Orthopaedic PRE-OP Admission History and Physical    Patient: Brandon Montoya. MRN: [id number]  SSN: [id number]    Date of Birth: 05-05-1965  Age: 58 y.o.  Sex: male            Subjective:   Patient is a 58 y.o. African American male who presents with history of left hand carpal tunnel and deQuervain's tenosynovitis. He has failed conservative treatment.    Patient Active Problem List    Diagnosis Date Noted    Major depressive disorder, recurrent severe without psychotic features (HCC) 08/21/2022    Severe major depression, single episode, with psychotic features (HCC) 08/21/2022    Cervical radiculopathy due to osteoarthritis of spine 05/20/2022    Artificial knee joint present 02/20/2022    Cervicalgia 02/20/2022    Cocaine use 02/20/2022    Elbow pain 02/20/2022    Erectile dysfunction 02/20/2022    History of colonic polyps 02/20/2022    Hypotension 02/20/2022    Prediabetes 02/20/2022    Abnormal glucose level 12/17/2021    Allergic conjunctivitis 12/17/2021    Aseptic necrosis of bone (HCC) 12/17/2021    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    Breast pain 12/17/2021    Closed fracture of metacarpal bone 12/17/2021    Constipation 12/17/2021    Enthesopathy of hip region 12/17/2021    Gastroesophageal reflux disease 12/17/2021    History of arthroplasty of left knee 12/17/2021    Hypertrophy of breast 12/17/2021    Insomnia 12/17/2021    Osteoarthritis of knee 12/17/2021    Chronic low back pain 12/17/2021    Obesity with body mass index 30 or greater 12/17/2021    Old anterior cruciate ligament disruption 12/17/2021    Panic disorder without agoraphobia 12/17/2021    Plantar fasciitis 12/17/2021    Post-traumatic stress disorder, unspecified 12/17/2021    Recurrent major depression 12/17/2021    Sleep apnea, unspecified 12/17/2021    Vitamin D deficiency 12/17/2021    Loss of consciousness (HCC) 07/09/2017    Aspiration pneumonia of lower lobe (HCC) 07/08/2017    Bipolar 1 disorder (HCC) 06/13/2017     Enlarged prostate 06/13/2017    Gallstone pancreatitis 06/13/2017    Hyperlipidemia 10/05/2014    Pain in joint involving pelvic region and thigh 10/12/2013     Past Medical History:   Diagnosis Date    Anxiety and depression 1994    on meds. was in [employer]    Arthritis 2019    hx bilateral knee surgery & revisions, cervical    Chronic pain     bilateral knees & lower back    Exercise tolerance finding 11/27/2023    can walk stairs withotu cx pain or sob    GERD (gastroesophageal reflux disease) 2002    Pantoprazole     History of blood transfusion     in [place]  with one of the knee replacement surgery    Hx of chest pain 2019    & syncope- resolved. no cardiologist    Hyperlipidemia 2019    Atorvastatin     Marijuana use     positive for Opioids 01/09/2022 no longer using    OSA (obstructive sleep apnea)     couldn't tolerated CPAP.    PTSD (post-traumatic stress disorder) 1994    on meds. was in [employer]. Saw therapist in the past    Wears reading eyeglasses       Past Surgical History:   Procedure Laterality  Date    BACK SURGERY  2023    fx back -hardware (rods & screws)    CERVICAL FUSION N/A 05/20/2022    ANTERIOR CERVICAL DECOMPRESSION FUSION CERVICAL SIX/SEVEN, CERVICAL SEVEN/THORACIC ONE; C-ARM; [STRYKER SPINE] performed by Alaine Oneil NOVAK, MD at [facility] MAIN OR    CHOLECYSTECTOMY, LAPAROSCOPIC  07/03/2017    COLONOSCOPY  03/2023    ESOPHAGOGASTRODUODENOSCOPY  09/30/2018    KNEE ARTHROPLASTY Bilateral 2019    with revisions    LUMBAR FUSION N/A 08/28/2022    LUMBAR TWO/THREE FUSION EXTENSION THORACIC TEN- LUMBAR TWO FUSION WITH C-ARM **INPATIENT STATUS DUE TO MEDICARE** performed by Alaine Oneil NOVAK, MD at [facility] MAIN OR    MASTECTOMY Right 2000    due to gynecomastia    XR MIDLINE EQUAL OR GREATER THAN 5 YEARS  09/20/2022    XR MIDLINE EQUAL OR GREATER THAN 5 YEARS 09/20/2022      Prior to Admission medications   Medication Sig Start Date End Date Taking? Authorizing Provider   oxyCODONE -acetaminophen   (PERCOCET) 5-325 MG per tablet Take 1 tablet by mouth every 6 hours as needed for Pain for up to 7 days. Intended supply: 7 days. Take lowest dose possible to manage pain Max Daily Amount: 4 tablets 12/03/23 12/10/23 Yes Claudene Tanda SAILOR, PA-C   montelukast  (SINGULAIR ) 10 MG tablet Take 1 tablet by mouth nightly 03/26/23  Yes [provider]   solifenacin (VESICARE) 5 MG tablet Take 2 tablets by mouth daily   Yes [provider]   pantoprazole  (PROTONIX ) 40 MG tablet TAKE 1 TABLET BY MOUTH EVERY DAY 02/27/23  Yes Mugaisi, Hesed N, MD   FLUoxetine  (PROZAC ) 20 MG capsule Take 4 capsules by mouth 2 times daily 03/05/18  Yes [provider]   atorvastatin  (LIPITOR) 20 MG tablet Take 1 tablet by mouth daily Indications: high cholesterol   Yes [provider]   clonazePAM  (KLONOPIN ) 1 MG tablet Take 2 tablets by mouth 2 times daily. Indications: Anxiety/depression/PTSD   Yes [provider]   QUEtiapine  (SEROQUEL ) 200 MG tablet Take 2 tablets by mouth at bedtime Indications: Anxiety/depression/PTSD    [provider]     Current Facility-Administered Medications   Medication Dose Route Frequency    ceFAZolin  (ANCEF ) 2000 mg in sterile water  20 mL IV syringe  2,000 mg IntraVENous On Call to OR    lactated ringers  infusion   IntraVENous Continuous    chlorhexidine  gluconate (ANTISEPTIC SKIN CLEANSER ) 4 % solution   Topical Daily PRN      Allergies   Allergen Reactions    Eletriptan Anaphylaxis, Shortness Of Breath, Swelling, Angioedema, Hives and Other (See Comments)    Indomethacin Nausea And Vomiting      Social History     Tobacco Use    Smoking status: Former     Types: Cigarettes, Cigars     Start date: 2000     Passive exposure: Current    Smokeless tobacco: Never   Substance Use Topics    Alcohol use: Yes     Comment: rarely      History reviewed. No pertinent family history.     Review of Systems  A comprehensive review of systems was negative except for that written  in the HPI.        Objective:   Patient Vitals for the past 8 hrs:   BP Temp Temp src Pulse Resp SpO2 Height Weight   12/03/23 1107 130/82 97.3 F (36.3 C) Temporal 78 16 100 %  1.854 m (6' 1) 103.2 kg (227 lb 9.6 oz)     Temp (24hrs), Avg:97.3 F (36.3 C), Min:97.3 F (36.3 C), Max:97.3 F (36.3 C)      Gen: Well-developed,  in no acute distress   HEENT: Pink conjunctivae, PERRL, hearing intact to voice, moist mucous membranes   Neck: Supple, without masses, thyroid non-tender   Resp: No accessory muscle use, clear breath sounds without wheezes rales or rhonchi   Card: No murmurs, normal S1, S2 without thrills, bruits or peripheral edema   Abd: Soft, non-tender, non-distended, normoactive bowel sounds are present, no palpable organomegaly and no detectable hernias   Lymph: No cervical or inguinal adenopathy   Musc: decreased sensation in a median n distribution. Pain with finkelstein's maneuver.   Skin: No skin breakdown noted. Skin warm, pink, dry  Neuro: Cranial nerves are grossly intact, no focal motor weakness, follows commands appropriately   Psych: Good insight, oriented to person, place and time, alert      Labs: No results found for this or any previous visit (from the past 24 hours).    Assessment:     Patient Active Problem List    Diagnosis Date Noted    Major depressive disorder, recurrent severe without psychotic features (HCC) 08/21/2022    Severe major depression, single episode, with psychotic features (HCC) 08/21/2022    Cervical radiculopathy due to osteoarthritis of spine 05/20/2022    Artificial knee joint present 02/20/2022    Cervicalgia 02/20/2022    Cocaine use 02/20/2022    Elbow pain 02/20/2022    Erectile dysfunction 02/20/2022    History of colonic polyps 02/20/2022    Hypotension 02/20/2022    Prediabetes 02/20/2022    Abnormal glucose level 12/17/2021    Allergic conjunctivitis 12/17/2021    Aseptic necrosis of bone (HCC) 12/17/2021    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     Breast pain 12/17/2021    Closed fracture of metacarpal bone 12/17/2021    Constipation 12/17/2021    Enthesopathy of hip region 12/17/2021    Gastroesophageal reflux disease 12/17/2021    History of arthroplasty of left knee 12/17/2021    Hypertrophy of breast 12/17/2021    Insomnia 12/17/2021    Osteoarthritis of knee 12/17/2021    Chronic low back pain 12/17/2021    Obesity with body mass index 30 or greater 12/17/2021    Old anterior cruciate ligament disruption 12/17/2021    Panic disorder without agoraphobia 12/17/2021    Plantar fasciitis 12/17/2021    Post-traumatic stress disorder, unspecified 12/17/2021    Recurrent major depression 12/17/2021    Sleep apnea, unspecified 12/17/2021    Vitamin D deficiency 12/17/2021    Loss of consciousness (HCC) 07/09/2017    Aspiration pneumonia of lower lobe (HCC) 07/08/2017    Bipolar 1 disorder (HCC) 06/13/2017    Enlarged prostate 06/13/2017    Gallstone pancreatitis 06/13/2017    Hyperlipidemia 10/05/2014    Pain in joint involving pelvic region and thigh 10/12/2013     Left hand carpal tunnel syndrome  Left wrist deQuervain's tenosynovitis    Plan:   Risks and benefits discussed at length. He understands and wishes to proceed with left carpal tunnel release and 1st extensor compartment release.  Proceed with surgical intervention without contraindications.      YANCY ONEIDA STAKES, MD  "

## 2023-12-03 NOTE — Progress Notes (Signed)
"  Patient tolerating PO     "

## 2023-12-03 NOTE — Periop Note (Signed)
"  Reviewed PTA medication list with patient/caregiver and patient/caregiver denies any additional medications.     Patient admits to having a responsible adult care for them at home for at least 24 hours after surgery.    Patient encouraged to use gown warming system and informed that using said warming gown to regulate body temperature prior to a procedure has been shown to help reduce the risks of blood clots and infection.    Patient's pharmacy of choice verified and documented in PTA medication section.    Dual skin assessment & fall risk band verification completed with Faith A RN.    "

## 2023-12-03 NOTE — Discharge Instructions (Addendum)
 Upper Extremity Surgery Discharge Instructions  Dr. Stevan    Please take the time to review the following instructions before you leave the hospital and use them as guidelines during your recovery from surgery. If you have any questions, you may contact my office at [phone].    Wound Care / Dressing Change    __________ Do NOT remove your dressing or get them wet.     _____x_____ You may change your dressings as needed. Two days after your surgery date you should remove your dressing. A big, bulky dressing isn't necessary as long as there isn't any drainage from the incisions. If there is drainage you can put a band-aid, primapore, or mepilex dressing over the incision and change it daily until drainage stops. It isn't necessary to apply antibiotic ointment to your incisions. If you have glue over your incision do not peel it off.  If you have steri-strips over your incision they will start to peel off in 7-10 days. They don't need to be removed prior to that. When they begin to peel off you can remove them. They should all be removed by 14 days from you surgery. Keep a towel or gauze in any skin folds that may hang over the incision so that it stays dry.     Showering / Bathing    ____x______ Brandon Montoya may shower 2 days after your surgery. Your dressing may be removed for showering. You may get your incision and the dressing adhered to your skin wet in the shower. Do not vigorously scrub your incision or the adherent dressing. Apply a clean, dry dressing after you have dried your incisions if there is drainage. Do not take a bath or get into a swimming pool or Jacuzzi until further instruction. Do not soak your incisions under water .     __________ Do not get the clear, plastic dressing wet. Once you remove it four days from surgery, you may get the incision wet if there is no drainage. If there is drainage, please call the office.     Sling    _____x____ Brandon Montoya are not required to wear a sling and  should do so only as needed for comfort.     _________ Keep your arm in the immobilizer/sling at all times except when showering and changing your clothes. When showering or changing, keep your arm at your side. Do not move it away from your body.     _________ Keep your arm in the immobilizer/sling at all times except when showering and changing your clothes, and doing the exercises shown to you by Dr. Stevan or your physical/occupational therapist prior to your discharge from the hospital. Keep your arm at your side when changing your clothes and showering. Do not move it away from your body.     Ice and Elevation    Continue ice consistently for 48 hours after surgery. After 48 hours, you should ice 3 times per day for 20 minutes at a time for the next 5 days. After 1 week from surgery, you may use ice as needed for pain.     Diet    You may advance your regular diet as tolerated. Increase your clear liquid intake for the next 2-3 days.     Medications    You will be given prescriptions for pain medication, inflammation, and nausea when you are discharged from the hospital. Please use the medications as prescribed. Pain medications may cause constipation - Colace or Milk of Magnesia may  be used as needed. Other possible side effects of pain medications are dizziness, headache, nausea, vomiting, and urinary retention. Discontinue the pain medication if you develop itching, rash, shortness of breath, or difficulties swallowing. If these symptoms become severe or aren't relieved by discontinuing the medication, you should seek immediate medical attention.   Refills of pain medication are authorized during office hours only (8AM - 5PM Monday through Friday)  If you were prescribed Percocet/Oxycodone  or Dilaudid /Hydromorphone  you must have a written prescription. These medications cannot be leagally called into the pharmacy.   Do not take Tylenol /Acetaminophen  in addition to your pain medication, as most pain  medications already contain this. Do not exceed 4000mg  of Tylenol /Acetaminophen  per day.   You may resume the medication you were taking prior to your surgery. Pain medication may change the effects of any antidepressant medication you may be taking. If you have any questions about possible interactions between your regular medication and the pain medication, you should consult the physician who prescribes your regular medications.  Other instructions:  Interscalene Block normal course: A numb and often immobile shoulder and upper arm is expected for approximately 8 - 12 hours after the surgery. The duration of the numbness can vary and is dependent on the type of local anesthetic used, additives and individual variation. Once the numbness starts to wear off, the discomfort from surgery will intensify progressively over the next 1-2 hours. Therefore we recommend starting oral narcotics (e.g. Norco or Percocet) as soon as oral medications are tolerated. These medications should be taken on a scheduled basis, allowing for a smooth transition from the nerve block to oral medication based pain relief.      Follow up appointment  Please follow up at your scheduled appointment 7-10 days from the day of your surgery. If you do not already have an appointment, please call our office at [phone] for your follow up appointment.    Physical Therapy:    ______ If you already have a therapy appointment, please be sure to attend your sessions as scheduled. If you do not have physical therapy scheduled, please call Dr. Amey office to set up your first appointment as soon as possible.    ___x___ Physical Therapy will be discussed with you at your first follow-up appointment with Dr. Stevan. You do not need to start therapy prior to that.     ______ Begin physical therapy with your Home Health Physical Therapy. This will be set up for you before you leave the hospital.     Important Signs and Symptoms:    If any of the  following signs and symptoms occurs, you should contact Dr. Amey office at [phone],. Please be advised if a problem arises which you feel requires immediate medical attention or you are unable to contact Dr. Amey office,  you should seek immediate medical attention.       Signs and symptoms to watch for include:    A sudden increase in swelling and/or redness or warmth at the area of your surgery which isn't relieved by rest, ice, and elevation.  Oral temperature greater than 101.5 degrees for 12 hours or more which isn't relieved by an increase in fluid intake and taking two Tylenol  every 4-6 hours.  Excessive drainage from your incisions, or drainage which hasn't stopped by 72 hours after your surgery.  Fever, chills, shortness of breath, chest pain, nausea, vomiting or other signs and symptoms which are of concern to you.  DISCHARGE SUMMARY from Nurse    PATIENT INSTRUCTIONS:    After general anesthesia or intravenous sedation, for 24 hours or while taking prescription Narcotics:  Limit your activities  Do not drive and operate hazardous machinery  Do not make important personal or business decisions  Do  not drink alcoholic beverages  If you have not urinated within 8 hours after discharge, please contact your surgeon on call.    Report the following to your surgeon:  Excessive pain, swelling, redness or odor of or around the surgical area  Temperature over 100.5  Nausea and vomiting lasting longer than 4 hours or if unable to take medications  Any signs of decreased circulation or nerve impairment to extremity: change in color, persistent  numbness, tingling, coldness or increase pain  Any questions        If you experience any of the following symptoms ABOVE, please follow up with DR.BADDAR.    *  Please give a list of your current medications to your Primary Care Provider.    *  Please update this list whenever your medications are discontinued, doses are      changed, or new  medications (including over-the-counter products) are added.    *  Please carry medication information at all times in case of emergency situations.    These are general instructions for a healthy lifestyle:    No smoking/ No tobacco products/ Avoid exposure to second hand smoke  Surgeon General's Warning:  Quitting smoking now greatly reduces serious risk to your health.    Obesity, smoking, and sedentary lifestyle greatly increases your risk for illness    A healthy diet, regular physical exercise & weight monitoring are important for maintaining a healthy lifestyle    You may be retaining fluid if you have a history of heart failure or if you experience any of the following symptoms:  Weight gain of 3 pounds or more overnight or 5 pounds in a week, increased swelling in our hands or feet or shortness of breath while lying flat in bed.  Please call your doctor as soon as you notice any of these symptoms; do not wait until your next office visit.     Patient armband removed and shredded     The discharge information has been reviewed with the patient and caregiver.  The patient and caregiver verbalized understanding.  Discharge medications reviewed with the patient and caregiver and appropriate educational materials and side effects teaching were provided.           Nausea and Vomiting After Surgery: Care Instructions  Your Care Instructions     After you've had surgery, you may feel sick to your stomach (nauseated) or you may vomit. Sometimes anesthesia can make you feel sick. It's a common side effect and often doesn't last long. Pain also can make you feel sick or vomit. After the anesthesia wears off, you may feel pain from the incision (cut). That pain could then upset your stomach. Taking pain medicine can also make you feel sick to your stomach.  Whatever the cause, you may get medicine that can help. There are also some things you can do at home to prevent nausea and feel better.  The doctor has checked you  carefully, but problems can develop later. If you notice any problems or new symptoms, get medical treatment right away.  Follow-up care is a key part of your treatment and safety. Be sure to make and go to all appointments, and call  your doctor if you are having problems. It's also a good idea to know your test results and keep a list of the medicines you take.  How can you care for yourself at home?  Take your pain medicine as soon as you have pain. It works better if you take it before the pain gets bad.  Call your doctor if you have any problems with your medicine.  To prevent dehydration, drink plenty of fluids. Choose water  and other clear liquids until you feel better. If you have kidney, heart, or liver disease and have to limit fluids, talk with your doctor before you increase the amount of fluids you drink.  When you are able to eat, try clear soups, mild foods, and liquids until all symptoms are gone for 12 to 48 hours. Other good choices include dry toast, crackers, cooked cereal, and gelatin dessert, such as Jell-O.  Do not smoke. Smoking and being around smoke can make nausea worse.   When should you call for help?    Call your doctor now or seek immediate medical care if:    You have new or worse nausea or vomiting.     You are too sick to your stomach to drink any fluids.     You cannot keep down fluids.     You have symptoms of dehydration, such as:  Dry eyes and a dry mouth.  Passing only a little urine.  Feeling thirstier than usual.     You are dizzy or lightheaded, or you feel like you may faint.           Infection After Surgery: Care Instructions  Overview  After surgery, an infection is always possible. It doesn't mean that the surgery didn't go well.  How can you care for yourself at home?  Make sure your surgeon knows about the infection, especially if you saw another doctor about your symptoms.  If your doctor prescribed antibiotics, take them as directed. Do not stop taking them just  because you feel better. You need to take the full course of antibiotics.  Keep the skin clean and dry.  You may have a bandage over the cut (incision). A bandage helps the incision heal and protects it. Your doctor will tell you how to take care of this. Keep it clean and dry. You may have drainage from the wound.     Call your doctor now or seek immediate medical care if:    You have signs of an infection such as:  Increased pain, swelling, warmth, or redness in the area.  Red streaks leading from the area.  Pus draining from the wound.  A new or higher fever.       Bleeding After Surgery: Care Instructions  Overview  After surgery, it is common to have some minor bruising or bleeding from the cut (incision) made by your doctor. But problems may occur that cause you to bleed too much in the surgery area.  An injury to a blood vessel can cause bleeding after surgery. Other causes include medicines such as aspirin or anticoagulants (blood thinners).  To help stop the bleeding, your doctor may have put pressure on the incision or sewn up or cauterized (sealed) the incision. Or you may have had surgery to stop bleeding inside the surgery area. Your doctor also may have given you medicines that help stop the bleeding.  How can you care for yourself at home?  If you have strips of tape on the  incision, leave the tape on until it falls off. Or follow your doctor's instructions for removing the tape. Keep the area dry at all times.  You will have a dressing over the incision. A dressing helps the incision heal and protects it. Your doctor will tell you how to take care of this.  If you do not have tape on the incision, wash the area daily with warm, soapy water , and pat it dry. Don't use hydrogen peroxide or alcohol, which can slow healing.    When should you call for help?    Call your doctor now or seek immediate medical care if:    You are dizzy or lightheaded, or you feel like you may faint.     You have bleeding that  starts again or gets worse, such as soaking one or more bandages over 2 to 4 hours, even after holding pressure on the area.         __________________________________________________________________________________________________________________________________

## 2023-12-03 NOTE — Periop Note (Signed)
"  TRANSFER - IN REPORT:    Verbal report received from OR nurse and CRNA on Brandon Montoya.  being received from OR for routine post-op      Report consisted of patient's Situation, Background, Assessment and   Recommendations(SBAR).     Information from the following report(s) Nurse Handoff Report, Adult Overview, Surgery Report, Intake/Output, MAR, Recent Results, and Med Rec Status was reviewed with the receiving nurse.    Opportunity for questions and clarification was provided.      Assessment completed upon patient's arrival to unit and care assumed.     "

## 2023-12-03 NOTE — Op Note (Signed)
"  OPERATIVE REPORT    Patient: Brandon Montoya CSN: [id number] SSN: [id number]    Date of Birth: 01/30/1965  Age: 58 y.o.  Sex: male      Admit Date: 12/03/2023  Admit Diagnosis: Carpal tunnel syndrome on left [G56.02]  Radial styloid tenosynovitis [M65.4]    Preoperative Diagnosis: Carpal tunnel syndrome on left [G56.02]  Radial styloid tenosynovitis [M65.4]  Postoperative Diagnosis: same    Procedure: Procedure(s):  LEFT CARPAL TUNNEL RELEASE AND LEFT DEQUERVAINS FIRST EXTENSOR RELEASE SPEC POP  Surgeon: YANCY ONEIDA STAKES, MD  Assistant(s):  Clotilda Found, PA  Anesthesia: General   Estimated Blood Loss:<50CC  Specimens: * No specimens in log *   Complications: None  Implants: * No implants in log *    BRIEF HISTORY: The patient has been suffering from carpal tunnel syndrome. They have failed conservative treatment. We discussed the option of surgical treatment including the risks and benefits. They understood and wished to proceed.    Operative Summary: The patient was brought to the operating suite, properly identified, placed supine upon the operating table and placed under a general anesthetic. Once an adequate level of anesthesia was obtained, a tourniquet was placed high on the affected arm. The had and forearm was prepped and draped in the  usual sterile fashion. The forearm was exsanguinated with an Esmarch, tourniquet was inflated to 250 mmHg. A 1.5 cm incision was made in her palm utilizing one of the palmar creases along the radial side of the ring finger. We dissected down through the subcutaneous tissue. The underlying palmaris fascia was split in line with the skin incision. The underlying transverse carpal ligament was then identified and split longitudinally. Care was taken to protect and preserve the underlying median nerve. A complete and adequate release was performed. Once that was completed, the area was lavaged. The skin was closed with 3-0 nylon in a horizontal mattress fashion. A sterile  compressive dressing was applied. The area was injected with local anesthetic.     Attention was turned to the first extensor compartment.  A 1 cm transverse incision was made just proximal to the distal radial styloid.  We dissected through the subcutaneous tissue.  Care was taken to protect and preserve the crossing radial sensory nerves.  The underlying first extensor carpal [place] was exposed.  This was split longitudinally along its sheath.  There was significant thickening and fluid within the sheath.  This was released along its length.  No accessory compartment was identified.  The tendons remained stable within the groove.  Once an adequate release of been completed air was then lavaged.  The incision was closed in standard fashion.  A sterile compressive dressing was applied.  The patient was awakened and transferred to the recovery in stable condition.  All needle and sponge counts were reported as correct at the end of the case.  Renay Sharps, PA did assist throughout the entire procedure.  "

## 2023-12-03 NOTE — Progress Notes (Signed)
"  TRANSFER - IN REPORT:    Verbal report received from TAHIRA,RN on Brandon Montoya.  being received from PACU for routine progression of patient care      Report consisted of patient's Situation, Background, Assessment and   Recommendations(SBAR).     Information from the following report(s) Nurse Handoff Report, Adult Overview, Surgery Report, Intake/Output, and MAR was reviewed with the receiving nurse.    Opportunity for questions and clarification was provided.      Assessment completed upon patient's arrival to unit and care assumed.     "

## 2023-12-10 ENCOUNTER — Inpatient Hospital Stay: Admit: 2023-12-10 | Payer: Medicare (Managed Care) | Primary: Family Medicine

## 2023-12-10 ENCOUNTER — Ambulatory Visit
Admit: 2023-12-10 | Discharge: 2023-12-10 | Payer: Medicare (Managed Care) | Attending: Family Medicine | Primary: Family Medicine

## 2023-12-10 VITALS — BP 121/84 | HR 77 | Temp 98.40000°F | Resp 20 | Ht 73.0 in | Wt 237.0 lb

## 2023-12-10 DIAGNOSIS — Z0000 Encounter for general adult medical examination without abnormal findings: Principal | ICD-10-CM

## 2023-12-10 DIAGNOSIS — R739 Hyperglycemia, unspecified: Principal | ICD-10-CM

## 2023-12-10 LAB — LIPID PANEL
Chol/HDL Ratio: 3.1 (ref 0–5.0)
Cholesterol, Total: 243 mg/dL
HDL: 80 mg/dL — ABNORMAL HIGH (ref 40–60)
LDL Cholesterol: 151 mg/dL — ABNORMAL HIGH (ref 0–100)
Triglycerides: 61 mg/dL (ref 0–150)
VLDL Cholesterol Calculated: 12 mg/dL

## 2023-12-10 LAB — PSA SCREENING: PSA: 3 ng/mL (ref 1.4–4.4)

## 2023-12-10 MED ORDER — PANTOPRAZOLE SODIUM 40 MG PO TBEC
40 | ORAL_TABLET | Freq: Every day | ORAL | 1 refills | 30.00000 days | Status: DC
Start: 2023-12-10 — End: 2023-12-24

## 2023-12-10 NOTE — ACP (Advance Care Planning) (Signed)
"  Advance Care Planning     Advance Care Planning (ACP) Physician/NP/PA Conversation    Date of Conversation: 12/10/2023  Conducted with: Patient with Decision Making Capacity  Other persons present: Spouse Brandon Montoya    Healthcare Decision Maker:   Primary Decision Maker (Active): Brandon Montoya [phone]     Today we documented Decision Maker(s) consistent with Legal Next of Kin hierarchy.    Care Preferences:    Hospitalization:  If your health worsens and it becomes clear that your chance of recovery is unlikely, what would be your preference regarding hospitalization?  The patient would prefer hospitalization.    Ventilation:  If you were unable to breath on your own and your chance of recovery was unlikely, what would be your preference about the use of a ventilator (breathing machine) if it was available to you?  The patient would desire the use of a ventilator.    Resuscitation:  In the event your heart stopped as a result of an underlying serious health condition, would you want attempts made to restart your heart, or would you prefer a natural death?  Yes, attempt to resuscitate.    treatment goals, ventilation preferences, hospitalization preferences, and resuscitation preferences    Conversation Outcomes / Follow-Up Plan:  ACP in process - completing/providing documents  Reviewed DNR/DNI and patient elects Full Code (Attempt Resuscitation)    Length of Voluntary ACP Conversation in minutes:  16 minutes    Brandon Vineyard LOISE Peck, MD         "

## 2023-12-10 NOTE — Patient Instructions (Signed)
 "     Learning About Being Physically Active  What is physical activity?     Being physically active means doing any kind of activity that gets your body moving.  The types of physical activity that can help you get fit and stay healthy include:  Aerobic or cardio activities. These make your heart beat faster and make you breathe harder, such as brisk walking, riding a bike, or running. They strengthen your heart and lungs and build up your endurance.  Strength training activities. These make your muscles work against, or resist, something. Examples include lifting weights or doing push-ups. These activities help tone and strengthen your muscles and bones.  Stretches. These let you move your joints and muscles through their full range of motion. Stretching helps you be more flexible.  Reaching a balance between these three types of physical activity is important because each one contributes to your overall fitness.  What are the benefits of being active?  Being active is one of the best things you can do for your health. It helps you to:  Feel stronger and have more energy to do all the things you like to do.  Focus better at school or work.  Feel, think, and sleep better.  Reach and stay at a healthy weight.  Lose fat and build lean muscle.  Lower your risk for serious health problems, including diabetes, heart attack, high blood pressure, and some cancers.  Keep your heart, lungs, bones, muscles, and joints strong and healthy.  How can you make being active part of your life?  Start slowly. Make it your long-term goal to get at least 30 minutes of exercise on most days of the week. Walking is a good choice. You also may want to do other activities, such as running, swimming, cycling, or playing tennis or team sports.  Pick activities that you like--ones that make your heart beat faster, your muscles stronger, and your muscles and joints more flexible. If you find more than one thing you like doing, do them all.  You don't have to do the same thing every day.  Get your heart pumping every day. Any activity that makes your heart beat faster and keeps it at that rate for a while counts.  Here are some great ways to get your heart beating faster:  Go for a brisk walk, run, or hike.  Go for a swim or bike ride.  Take an online exercise class or dance.  Play a game of touch football, basketball, or soccer.  Play tennis, pickleball, or racquetball.  Climb stairs.  Even some household chores can be aerobic. Just do them at a faster pace. Raking or mowing the lawn, sweeping the garage, and vacuuming and cleaning your home all can help get your heart rate up.  Strengthen your muscles during the week. You don't have to lift heavy weights or grow big, bulky muscles to get stronger. Doing a few simple activities that make your muscles work against, or resist, something can help you get stronger. Aim for at least twice a week.  For example, you can:  Do push-ups or sit-ups, which use your own body weight as resistance.  Lift weights or dumbbells or use stretch bands at home or in a gym or community center.  Stretch your muscles often. Stretching will help you as you become more active. It can help you stay flexible and loosen tight muscles. It can also help improve your balance and posture and can be  a great way to relax.  Be sure to stretch the muscles you'll be using when you work out. It's best to warm your muscles slightly before you stretch them. Walk or do some other light aerobic activity for a few minutes. Then start stretching.  When you stretch your muscles:  Do it slowly. Stretching is not about going fast or making sudden movements.  Don't push or bounce during a stretch.  Hold each stretch for at least 15 to 30 seconds, if you can. You should feel a stretch in the muscle, but not pain.  Breathe out as you do the stretch. Then breathe in as you hold the stretch. Don't hold your breath.  If you're worried about how more  activity might affect your health, have a checkup before you start. Follow any special advice your doctor gives you for getting a smart start.  Where can you learn more?  Go to [employer] and enter W332 to learn more about Learning About Being Physically Active.  Current as of: August 07, 2022  Content Version: 14.6   2024-2025 [place], [place].   Care instructions adapted under license by [facility]. If you have questions about a medical condition or this instruction, always ask your healthcare professional. Romayne Alderman, [facility] disclaims any warranty or liability for your use of this information.         Learning About Vision Tests  What are vision tests?     The four most common vision tests are visual acuity tests, refraction, visual field tests, and color vision tests.  Visual acuity (sharpness) tests  These tests are used:  To see if you need glasses or contact lenses.  To monitor an eye problem.  To check an eye injury.  Visual acuity tests are done as part of routine exams. You may also have this test when you get your driver's license or apply for some types of jobs.  Visual field tests  These tests are used:  To check for vision loss in any area of your range of vision.  To screen for certain eye diseases.  To look for nerve damage after a stroke, head injury, or other problem that could reduce blood flow to the brain.  Refraction and color tests  A refraction test is done to find the right prescription for glasses and contact lenses.  A color vision test is done to check for color blindness.  Color vision is often tested as part of a routine exam. You may also have this test when you apply for a job where recognizing different colors is important, such as truck driving, optician, dispensing, or the [employer].  How are vision tests done?  Visual acuity test   You cover one eye at a time.  You read aloud from a wall chart across the room.  You read aloud from a small card that  you hold in your hand.  Refraction   You look into a special device.  The device puts lenses of different strengths in front of each eye to see how strong your glasses or contact lenses need to be.  Visual field tests   Your doctor may have you look through special machines.  Or your doctor may simply have you stare straight ahead while they move a finger into and out of your field of vision.  Color vision test   You look at pieces of printed test patterns in various colors. You say what number or symbol you see.  Your doctor  may have you trace the number or symbol using a pointer.  How do these tests feel?  There is very little chance of having a problem from this test. If dilating drops are used for a vision test, they may make the eyes sting and cause a medicine taste in the mouth.  Follow-up care is a key part of your treatment and safety. Be sure to make and go to all appointments, and call your doctor if you are having problems. It's also a good idea to know your test results and keep a list of the medicines you take.  Where can you learn more?  Go to [employer] and enter G551 to learn more about Learning About Vision Tests.  Current as of: August 07, 2022  Content Version: 14.6   2024-2025 [place], [place].   Care instructions adapted under license by [facility]. If you have questions about a medical condition or this instruction, always ask your healthcare professional. Romayne Alderman, [facility], disclaims any warranty or liability for your use of this information.         Starting a Weight-Loss Plan: Care Instructions  Overview    It can be a challenge to lose weight. But your doctor can help you make a weight-loss plan that meets your needs.  You don't have to make a lot of big changes at once. A better idea might be to focus on small changes and stick with them. When those changes become habit, you can add a few more changes.  Some people find it helpful to take an exercise  or nutrition class. If you have questions, ask your doctor about seeing a registered dietitian or an exercise specialist. You might also think about joining a weight-loss support group.  If you're not ready to make changes right now, try to pick a date in the future. Then make an appointment with your doctor to talk about when and how you'll get started with a plan.  Follow-up care is a key part of your treatment and safety. Be sure to make and go to all appointments, and call your doctor if you are having problems. It's also a good idea to know your test results and keep a list of the medicines you take.  How can you care for yourself as you start a weight-loss plan?   Set realistic goals. Many people expect to lose much more weight than is likely. A weight loss of 5% to 10% of your body weight may be enough to improve your health.  Get family and friends involved to provide support. Talk to them about why you are trying to lose weight, and ask them to help. They can help by participating in exercise and having meals with you, even if they may be eating something different.  Find what works best for you. If you do not have time or do not like to cook, a program that offers meal replacement bars or shakes may be better for you. Or if you like to prepare meals, finding a plan that includes daily menus and recipes may be best.  Ask your doctor about other health professionals who can help you achieve your weight-loss goals.  A dietitian can help you make healthy changes in your diet.  An exercise specialist or personal trainer can help you develop a safe and effective exercise program.  A counselor or psychiatrist can help you cope with issues such as depression, anxiety, or family problems that can make it hard to focus  on weight loss.  Consider joining a support group for people who are trying to lose weight. Your doctor can suggest groups in your area.  Where can you learn more?  Go to  [employer] and enter U357 to learn more about Starting a Weight-Loss Plan: Care Instructions.  Current as of: March 08, 2023  Content Version: 14.6   2024-2025 [place], [place].   Care instructions adapted under license by [facility]. If you have questions about a medical condition or this instruction, always ask your healthcare professional. Romayne Alderman, [facility], disclaims any warranty or liability for your use of this information.         Advance Directives: Care Instructions  Overview  An advance directive is a legal way to state your wishes at the end of your life. It tells your loved ones and doctor what to do if you can't say what you want.  There are two main types of advance directives. You can change them any time your wishes change.  Living will. This form tells your loved ones and doctor your wishes about life support and other treatment. The form is also called a declaration.  Medical power of attorney. This form lets you name a person to make treatment decisions for you when you can't speak for yourself. This person is called a health care agent (health care proxy, health care surrogate). The form is also called a durable power of attorney for health care.  If you do not have an advance directive, decisions about your medical care may be made by a family member or doctor who doesn't know you or by a judge.  It may help to think of an advance directive as a gift to the people who care for you. If you have one, they won't have to make tough decisions by themselves.  For more information, including forms for your state, see the [employer] website (plumberbiz.com.cy).  Follow-up care is a key part of your treatment and safety. Be sure to make and go to all appointments, and call your doctor if you are having problems. It's also a good idea to know your test results and keep a list of the medicines you take.  What should you include in an  advance directive?  Many states have a unique advance directive form. (It may ask you to address specific issues.) Or you might use a universal form that's approved by many states.  If your form doesn't tell you what to address, it may be hard to know what to include in your advance directive. Use the questions below to help you get started.  Who do you want to make decisions about your medical care if you are not able to?  What life-support measures do you want if you have a serious illness that gets worse over time or can't be cured?  What are you most afraid of that might happen? (Maybe you're afraid of having pain, losing your independence, or being kept alive by machines.)  Where would you prefer to die? (Your home? A hospital? A nursing home?)  Do you want to donate your organs when you die?  Do you want certain religious practices performed before you die?  When should you call for help?  Be sure to contact your doctor if you have any questions.  Where can you learn more?  Go to [employer] and enter R264 to learn more about Advance Directives: Care Instructions.  Current as of: July 08, 2023  Content Version: 14.6  2024-2025 [employer].   Care instructions adapted under license by [facility]. If you have questions about a medical condition or this instruction, always ask your healthcare professional. Romayne Alderman, [facility], disclaims any warranty or liability for your use of this information.         A Healthy Heart: Care Instructions  Overview    Coronary artery disease, also called heart disease, occurs when a substance called plaque builds up in the vessels that supply oxygen-rich blood to your heart muscle. This can narrow the blood vessels and reduce blood flow. A heart attack happens when blood flow is completely blocked. A high-fat diet, smoking, and other factors increase the risk of heart disease.  Your doctor has found that you have a chance of having heart  disease. A heart-healthy lifestyle can help keep your heart healthy and prevent heart disease. This lifestyle includes eating healthy, being active, staying at a weight that's healthy for you, and not smoking, vaping, or using other tobacco or nicotine products. It also includes taking medicines as directed, managing other health conditions, and trying to get a healthy amount of sleep.  Follow-up care is a key part of your treatment and safety. Be sure to make and go to all appointments, and contact your doctor if you are having problems. It's also a good idea to know your test results and keep a list of the medicines you take.  How can you care for yourself at home?  Diet  Use less salt when you cook and eat. This helps lower your blood pressure. Taste food before salting. Add only a little salt when you think you need it. With time, your taste buds will adjust to less salt.  Eat fewer snack items, fast foods, canned soups, and other high-salt, high-fat, processed foods.  Read food labels and try to avoid saturated and trans fats. They increase your risk of heart disease by raising cholesterol levels.  Limit the amount of solid fat--butter, margarine, and shortening--you eat. Use olive, peanut, or canola oil when you cook. Bake, broil, and steam foods instead of frying them.  Eat a variety of fruit and vegetables every day. Dark green, deep orange, red, or yellow fruits and vegetables are especially good for you. Examples include spinach, carrots, peaches, and berries.  Foods high in fiber can reduce your cholesterol and provide important vitamins and minerals. High-fiber foods include whole-grain cereals and breads, oatmeal, beans, brown rice, citrus fruits, and apples.  Eat lean proteins. Heart-healthy proteins include seafood, lean meats and poultry, eggs, beans, peas, nuts, seeds, and soy products.  Limit drinks and foods with added sugar. These include candy, desserts, and soda pop.  Heart-healthy  lifestyle  If your doctor recommends it, get more exercise. For many people, walking is a good choice. Or you may want to swim, bike, or do other activities. Bit by bit, increase the time you're active every day. Try for at least 30 minutes on most days of the week.  If you smoke, vape, or use other tobacco or nicotine products, try to quit. If you cant quit, cut back as much as you can. If you need help quitting, talk to your doctor about quit programs and medicines. Quitting is one of the most important things you can do to protect your heart. Also avoid secondhand smoke and the aerosol mist from vaping.  Stay at a weight that's healthy for you. Talk to your doctor if you need help losing weight.  Try to  get 7 to 9 hours of sleep each night.  Limit alcohol to 2 drinks a day for men and 1 drink a day for women. Too much alcohol can cause health problems.  Manage other health problems such as diabetes, high blood pressure, and high cholesterol. If you think you may have a problem with alcohol or drug use, talk to your doctor.  Medicines  Take your medicines exactly as prescribed. Contact your doctor if you think you are having a problem with your medicine.  When should you call for help?  Call 911 if you have symptoms of a heart attack. These may include:  Chest pain or pressure, or a strange feeling in the chest.  Sweating.  Shortness of breath.  Pain, pressure, or a strange feeling in the back, neck, jaw, or upper belly or in one or both shoulders or arms.  Lightheadedness or sudden weakness.  A fast or irregular heartbeat.  After you call 911, the operator may tell you to chew 1 adult-strength or 2 to 4 low-dose aspirin. Wait for an ambulance. Do not try to drive yourself.  Watch closely for changes in your health, and be sure to contact your doctor if you have any problems.  Where can you learn more?  Go to [employer] and enter F075 to learn more about A Healthy Heart: Care  Instructions.  Current as of: August 07, 2022  Content Version: 14.6   2024-2025 [place], [place].   Care instructions adapted under license by [facility]. If you have questions about a medical condition or this instruction, always ask your healthcare professional. Romayne Alderman, [facility], disclaims any warranty or liability for your use of this information.    Personalized Preventive Plan for Brandon Montoya. - 12/10/2023  Medicare offers a range of preventive health benefits. Some of the tests and screenings are paid in full while other may be subject to a deductible, co-insurance, and/or copay.  Some of these benefits include a comprehensive review of your medical history including lifestyle, illnesses that may run in your family, and various assessments and screenings as appropriate.  After reviewing your medical record and screening and assessments performed today your provider may have ordered immunizations, labs, imaging, and/or referrals for you.  A list of these orders (if applicable) as well as your Preventive Care list are included within your After Visit Summary for your review.      "

## 2023-12-10 NOTE — Progress Notes (Signed)
 "HPI  Brandon Montoya. comes in for follow up care.  He is accompanied by his wife.  Left carpal tunnel: Patient has left carpal tunnel syndrome.  He had carpal tunnel had release last week.  Patient also had left de Quervain's first extensor release done.  He is following up with the hand specialist.  He has a wrist splint in place at the moment.  Denies pain or discomfort.  He is taking pain medication as needed.  Mood disorder: Patient has mood disorder.  He has bipolar disorder.  He is seen through the [place]. he is on Seroquel , clonazepam , fluoxetine .  He will continue with these medications.  He will continue with management as recommended by his specialist.  LUTS: Patient has no urinary tract symptoms.  He has urgency and occasional incontinence.  He is on vesicare.  He has been stable on the medication.  He would like to see the urologist for follow-up care.  Referral will be placed.   Headache: Patient has severe headaches.  These have been ongoing for months.  Headaches are global.  No changes in vision or focal weakness.  States that he bumped his head on his truck and since then has been having the headaches.  I will order head CT scan.  Patient also requests referral to see the neurologist given the headaches.  I will refer patient to neurologist.  Patient is taking Tylenol  for pain as needed.   GERD: Patient has gastroesophageal reflux disease.  He is on Protonix .  Stable on medication.  Denies dark stools or hematemesis.  Continue current treatment plan.  Hyperglycemia: I will check HbA1c.  Health maintenance: Will check PSA to screen for prostate cancer.  I will order hepatitis C antibody screening test.        Past Medical History  Past Medical History:   Diagnosis Date    Anxiety and depression 1994    on meds. was in [employer]    Arthritis 2019    hx bilateral knee surgery & revisions, cervical    Chronic pain     bilateral knees & lower back    Exercise tolerance finding 11/27/2023    can walk  stairs withotu cx pain or sob    GERD (gastroesophageal reflux disease) 2002    Pantoprazole     History of blood transfusion     in [place]  with one of the knee replacement surgery    Hx of chest pain 2019    & syncope- resolved. no cardiologist    Hyperlipidemia 2019    Atorvastatin     Marijuana use     positive for Opioids 01/09/2022 no longer using    OSA (obstructive sleep apnea)     couldn't tolerated CPAP.    PTSD (post-traumatic stress disorder) 1994    on meds. was in [employer]. Saw therapist in the past    Wears reading eyeglasses        Surgical History  Past Surgical History:   Procedure Laterality Date    BACK SURGERY  2023    fx back -hardware (rods & screws)    CARPAL TUNNEL RELEASE Left 12/03/2023    LEFT CARPAL TUNNEL RELEASE AND LEFT DEQUERVAINS FIRST EXTENSOR RELEASE SPEC POP performed by Stevan Yancy DASEN, MD at [facility] MAIN OR    CERVICAL FUSION N/A 05/20/2022    ANTERIOR CERVICAL DECOMPRESSION FUSION CERVICAL SIX/SEVEN, CERVICAL SEVEN/THORACIC ONE; C-ARMMERL DONATE SPINE] performed by Alaine Oneil NOVAK, MD at [facility] MAIN OR    CHOLECYSTECTOMY,  LAPAROSCOPIC  07/03/2017    COLONOSCOPY  03/2023    ESOPHAGOGASTRODUODENOSCOPY  09/30/2018    KNEE ARTHROPLASTY Bilateral 2019    with revisions    LUMBAR FUSION N/A 08/28/2022    LUMBAR TWO/THREE FUSION EXTENSION THORACIC TEN- LUMBAR TWO FUSION WITH C-ARM **INPATIENT STATUS DUE TO MEDICARE** performed by Alaine Oneil NOVAK, MD at [facility] MAIN OR    MASTECTOMY Right 2000    due to gynecomastia    XR MIDLINE EQUAL OR GREATER THAN 5 YEARS  09/20/2022    XR MIDLINE EQUAL OR GREATER THAN 5 YEARS 09/20/2022        Medications  Current Outpatient Medications   Medication Sig Dispense Refill    oxyCODONE -acetaminophen  (PERCOCET) 5-325 MG per tablet Take 1 tablet by mouth every 6 hours as needed for Pain for up to 7 days. Intended supply: 7 days. Take lowest dose possible to manage pain Max Daily Amount: 4 tablets 18 tablet 0    montelukast  (SINGULAIR ) 10 MG tablet Take 1  tablet by mouth nightly      solifenacin (VESICARE) 5 MG tablet Take 2 tablets by mouth daily      pantoprazole  (PROTONIX ) 40 MG tablet TAKE 1 TABLET BY MOUTH EVERY DAY 90 tablet 1    FLUoxetine  (PROZAC ) 20 MG capsule Take 4 capsules by mouth 2 times daily      atorvastatin  (LIPITOR) 20 MG tablet Take 1 tablet by mouth daily Indications: high cholesterol      QUEtiapine  (SEROQUEL ) 200 MG tablet Take 2 tablets by mouth at bedtime Indications: Anxiety/depression/PTSD      clonazePAM  (KLONOPIN ) 1 MG tablet Take 2 tablets by mouth 2 times daily. Indications: Anxiety/depression/PTSD       No current facility-administered medications for this visit.       Allergies  Allergies   Allergen Reactions    Eletriptan Anaphylaxis, Shortness Of Breath, Swelling, Angioedema, Hives and Other (See Comments)    Indomethacin Nausea And Vomiting       Family History  History reviewed. No pertinent family history.    Social History  Social History     Socioeconomic History    Marital status: Married     Spouse name: Not on file    Number of children: Not on file    Years of education: Not on file    Highest education level: Not on file   Occupational History    Not on file   Tobacco Use    Smoking status: Former     Types: Cigarettes, Cigars     Start date: 2000     Passive exposure: Current    Smokeless tobacco: Never   Vaping Use    Vaping status: Never Used   Substance and Sexual Activity    Alcohol use: Yes     Alcohol/week: 0.0 - 1.0 standard drinks of alcohol     Comment: rarely    Drug use: Not Currently     Types: Marijuana Oda)     Comment: last used june/ July 2025    Sexual activity: Not on file   Other Topics Concern    Not on file   Social History Narrative    Not on file     Social Drivers of Health     Financial Resource Strain: Low Risk  (02/20/2022)    Overall Financial Resource Strain (CARDIA)     Difficulty of Paying Living Expenses: Not hard at all   Food Insecurity: No Food Insecurity (12/10/2023)  Hunger Vital  Sign     Worried About Running Out of Food in the Last Year: Never true     Ran Out of Food in the Last Year: Never true   Transportation Needs: No Transportation Needs (12/10/2023)    PRAPARE - Therapist, Art (Medical): No     Lack of Transportation (Non-Medical): No   Physical Activity: Inactive (12/10/2023)    Exercise Vital Sign     Days of Exercise per Week: 0 days     Minutes of Exercise per Session: 0 min   Stress: Not on file   Social Connections: Not on file   Intimate Partner Violence: Not on file   Housing Stability: Low Risk  (12/10/2023)    Housing Stability Vital Sign     Unable to Pay for Housing in the Last Year: No     Number of Times Moved in the Last Year: 0     Homeless in the Last Year: No       Review of Systems  Review of Systems - Review of all systems is negative except as noted above in the HPI.    Vital Signs  BP 121/84   Pulse 77   Temp 98.4 F (36.9 C)   Resp 20   Ht 1.854 m (6' 1)   Wt 107.5 kg (237 lb)   SpO2 99%   BMI 31.27 kg/m       Physical Exam  Physical Examination: General appearance - oriented to person, place, and time and acyanotic, in no respiratory distress  Mental status - affect appropriate to mood  Neck - supple, no significant adenopathy  Lymphatics - no palpable lymphadenopathy  Chest - no tachypnea, retractions or cyanosis  Heart - S1 and S2 normal  Abdomen - no rebound tenderness noted  Back exam - limited range of motion  Neurological - neck supple without rigidity, motor and sensory grossly normal bilaterally, normal muscle tone, no tremors, strength 5/5  Musculoskeletal - osteoarthritic changes noted in right hands, left hand is in a splint  Extremities - intact peripheral pulses        Results  No results found for this visit on 12/10/23.    ASSESSMENT and PLAN    ICD-10-CM    1. Hyperlipidemia, unspecified hyperlipidemia type  E78.5 Lipid Panel      2. Severe major depression, single episode, with psychotic features (HCC)  F32.3        3. Bipolar 1 disorder (HCC)  F31.9       4. PTSD (post-traumatic stress disorder)  F43.10       5. Lower urinary tract symptoms (LUTS)  R39.9 [facility] [place]      6. Gastro-esophageal reflux disease without esophagitis  K21.9 pantoprazole  (PROTONIX ) 40 MG tablet      7. Screening for malignant neoplasm of prostate  Z12.5 PSA Screening      8. Need for hepatitis C screening test  Z11.59 Hepatitis C Ab, Rflx to Qt by PCR      9. Hyperglycemia  R73.9 Hemoglobin A1C      10. ACP (advance care planning)  Z71.89       11. Chronic intractable headache, unspecified headache type  R51.9 [facility] - Terrill Coulter, MD, Neurology, [facility] [place])    H10.70 CT HEAD WO CONTRAST     External Referral To Neurology      lab results and schedule of future lab studies reviewed with  patient  reviewed diet, exercise and weight control  cardiovascular risk and specific lipid/LDL goals reviewed  reviewed medications and side effects in detail  radiology results and schedule of future radiology studies reviewed with patient  I spent 30 minutes with this established patient on chronic care separate from Medicare wellness exam.    I have discussed the diagnosis with the patient and the intended plan of care as seen in the above orders. The patient has received an after-visit summary and questions were answered concerning future plans. I have discussed medication, side effects, and warnings with the patient in detail. The patient verbalized understanding and is in agreement with the plan of care. The patient will contact the office with any additional concerns.    Ortencia Askari LOISE Peck, MD    PLEASE NOTE:   This document has been produced using voice recognition software. Unrecognized errors in transcription may be present  "

## 2023-12-10 NOTE — Progress Notes (Signed)
 "Medicare Annual Wellness Visit    Brandon Montoya. is here for Medicare AWV and Referral - General ([facility])    Assessment & Plan    Diagnosis Orders   1. Medicare annual wellness visit, subsequent        2. Severe major depression, single episode, with psychotic features (HCC)        3. Hyperlipidemia, unspecified hyperlipidemia type  Lipid Panel      4. Bipolar 1 disorder (HCC)        5. PTSD (post-traumatic stress disorder)        6. Lower urinary tract symptoms (LUTS)  [facility] [place]      7. Gastro-esophageal reflux disease without esophagitis  pantoprazole  (PROTONIX ) 40 MG tablet      8. Screening for malignant neoplasm of prostate  PSA Screening      9. Need for hepatitis C screening test  Hepatitis C Ab, Rflx to Qt by PCR      10. Hyperglycemia  Hemoglobin A1C      11. ACP (advance care planning)        12. Chronic intractable headache, unspecified headache type  [facility] - Terrill Coulter, MD, Neurology, [facility] [place])    CT HEAD WO CONTRAST    External Referral To Neurology           Return in about 6 months (around 06/09/2024), or if symptoms worsen or fail to improve, for DYSLIPIDEMIA, headache.     Subjective   Brandon Montoya. comes in for Medicare wellness exam.      Patient's complete Health Risk Assessment and screening values have been reviewed and are found in Flowsheets. The following problems were reviewed today and where indicated follow up appointments were made and/or referrals ordered.    Positive Risk Factor Screenings with Interventions:          Controlled Medication Review:    Today's Pain Level: No data recorded   Opioid Risk: (Low risk score <55) Opioid risk score: 50    Patient is low risk for opioid use disorder or overdose.    Last PDMP Mark as Reviewed:  Review User Review Instant Review Result                 Inactivity:  On average, how many days per week do you engage in moderate to strenuous exercise (like a brisk walk)?: 0 days  (!) Abnormal  On average, how many minutes do you engage in exercise at this level?: 0 min  Interventions:  Patient advised to follow up in the office for further evaluation and treatment     Abnormal BMI (obese):  Body mass index is 31.27 kg/m. (!) Abnormal  Interventions:  Patient advised to follow-up in this office for further evaluation and treatment      Dentist Screen:  Have you seen the dentist within the past year?: (!) No  Intervention:  Advised to schedule with their dentist     Vision Screen:  Do you have difficulty driving, watching TV, or doing any of your daily activities because of your eyesight?: No  Have you had an eye exam within the past year?: (!) No  Interventions:   Patient encouraged to make appointment with their eye specialist       Advanced Directives:  Do you have a Living Will?: (!) No  Intervention:  see ACP note        Objective   Vitals:  12/10/23 0807   BP: 121/84   Pulse: 77   Resp: 20   Temp: 98.4 F (36.9 C)   SpO2: 99%   Weight: 107.5 kg (237 lb)   Height: 1.854 m (6' 1)      Body mass index is 31.27 kg/m.               Allergies   Allergen Reactions    Eletriptan Anaphylaxis, Shortness Of Breath, Swelling, Angioedema, Hives and Other (See Comments)    Indomethacin Nausea And Vomiting     Prior to Visit Medications   Medication Sig Taking? Authorizing Provider   pantoprazole  (PROTONIX ) 40 MG tablet Take 1 tablet by mouth daily Yes Agnes Brightbill N, MD   oxyCODONE -acetaminophen  (PERCOCET) 5-325 MG per tablet Take 1 tablet by mouth every 6 hours as needed for Pain for up to 7 days. Intended supply: 7 days. Take lowest dose possible to manage pain Max Daily Amount: 4 tablets Yes Claudene Tanda SAILOR, PA-C   montelukast  (SINGULAIR ) 10 MG tablet Take 1 tablet by mouth nightly Yes [provider]   solifenacin (VESICARE) 5 MG tablet Take 2 tablets by mouth daily Yes [provider]   FLUoxetine  (PROZAC ) 20 MG capsule Take 4 capsules by mouth 2 times daily Yes  [provider]   atorvastatin  (LIPITOR) 20 MG tablet Take 1 tablet by mouth daily Indications: high cholesterol Yes [provider]   QUEtiapine  (SEROQUEL ) 200 MG tablet Take 2 tablets by mouth at bedtime Indications: Anxiety/depression/PTSD Yes [provider]   clonazePAM  (KLONOPIN ) 1 MG tablet Take 2 tablets by mouth 2 times daily. Indications: Anxiety/depression/PTSD Yes [provider]       CareTeam (Including outside providers/suppliers regularly involved in providing care):   Patient Care Team:  Taggart Prasad, Allen SAILOR, MD as PCP - General (Family Medicine)  Dawud Mays, Allen SAILOR, MD as PCP - Empaneled Provider  Dayla, Donice B, DO (Infectious Diseases)     Recommendations for Preventive Services Due: see orders and patient instructions/AVS.  Recommended screening schedule for the next 5-10 years is provided to the patient in written form: see Patient Instructions/AVS.     Reviewed and updated this visit:  Tobacco  Allergies  Meds  Problems  Med Hx  Surg Hx  Fam Hx            I have discussed the diagnosis with the patient and the intended plan of care as seen in the above orders. The patient has received an after-visit summary and questions were answered concerning future plans. I have discussed medication, side effects, and warnings with the patient in detail. The patient verbalized understanding and is in agreement with the plan of care. The patient will contact the office with any additional concerns.  Personalized preventative plan of care was discussed, printed and given to patient.    Astrid Vides SAILOR Peck, MD     "

## 2023-12-11 LAB — HEPATITIS C AB, RFLX TO QT BY PCR: Hepatitis C Ab: NONREACTIVE {s_co_ratio}

## 2023-12-11 LAB — HCV INTERPRETATION

## 2023-12-11 LAB — HEMOGLOBIN A1C
Estimated Avg Glucose: 115 mg/dL
Hemoglobin A1C: 5.7 % — ABNORMAL HIGH (ref 4.2–5.6)

## 2023-12-12 ENCOUNTER — Encounter

## 2023-12-12 MED ORDER — ATORVASTATIN CALCIUM 40 MG PO TABS
40 | ORAL_TABLET | Freq: Every day | ORAL | 1 refills | 90.00000 days | Status: DC
Start: 2023-12-12 — End: 2023-12-24

## 2023-12-18 LAB — HEMOGLOBIN A1C
Estimated Avg Glucose, External: 117 mg/dL (ref 91–123)
Hemoglobin A1C, External: 5.7 % — ABNORMAL HIGH (ref 4.8–5.6)

## 2023-12-19 ENCOUNTER — Ambulatory Visit: Payer: Medicare (Managed Care) | Primary: Family Medicine

## 2023-12-25 ENCOUNTER — Inpatient Hospital Stay: Payer: Medicare (Managed Care) | Primary: Family Medicine

## 2023-12-25 NOTE — Periop Note (Addendum)
 "PCP is aware of the surgery. Instructed to bring Insurance card and ID on the day of surgery.  Advance directives status: none     SURGICAL PRE-ADMISSION INSTRUCTIONS    Your Medication instructions:    TAKE these medications the morning of surgery with a sip of water : Atorvastatin , Clonazepam , Fluoxetine , Pantoprazole     Follow surgeon's instructions for stopping NSAIDs (i.e. Ibuprofen/Motrin, Advil, BC powder, Aleve/Naproxen, Voltaren cream or tablet, Mobic/Meloxicam, Excedrin, Celebrex, Ketorolac /Toradol , etc). If no instructions provided stop NSAIDs 7 days prior to surgery.    Stop supplements and vitamins (except multivitamins) 2 weeks prior to surgery or now if surgery within 2 weeks of PAT Phone interview.    Patient denies being on any inhalers, eye drops, ointment/lotions/gels, patches, injectable medications, phentermine, supplements, vitamins, and herbals at this time. Instructed patient to please notify Preanesthesia Testing at [phone] with any medication changes after the PAT phone appointment.    General Day Surgery Instructions    Do NOT eat or drink anything, including water , ice chips, soda, candy, or gum after MIDNIGHT on the night before surgery, unless you have specific instructions from your Surgeon or Anesthesia Provider to do so.   Please follow instructions for cleaning skin with CHG wash kit 3 days prior to surgery as directed.(Mailed)   Please shower with antibacterial bar soap prior to arrival on day of surgery.  No smoking/vaping/chewing tobacco products 24 hours before surgery.  No recreational drugs for one week prior to the day of surgery.  Remove all jewelry/body piercing, no lotions, powders, deodorant, or cologne/after shave.  Leave all valuables, including money/wallet, jewelry, laptop, etc.. at home.  Bring durable medical equipment (DME) needed: Vannie  Glasses may be worn to the hospital. They will be removed prior to surgery.  Call your doctor if you have symptoms of a  cold or illness, rash, wounds, or open areas on your skin prior to surgery. Patient denies any symptoms of illnesses and no open skin areas/rashes/wounds at this time.  More than one visitor is allowed in the waiting room of the Surgical [facility] on the day of surgery; however, only 1 visitor is allowed to accompany you in the clinical areas.  AN ADULT MUST DRIVE YOU HOME AFTER OUTPATIENT SURGERY.   If you are having an OUTPATIENT procedure, please make arrangements for a responsible adult to be with you for 24 hours after your surgery.  If you are admitted to the hospital, you will be assigned to a bed after surgery is complete.  Please anticipate being here at the Hospital A MINIMUM of 5-6 hours on the Surgery Day. Please DO NOT make any other appointment for the day of surgery. The staff will inform you and your family if any delays occur on the day of surgery. Please ensure that your designated driver or ride remains on campus for the entire duration of your stay on the day of surgery. This is important for your safety and timely discharge.     Arrival time will be provided to you by your surgeon's office on the day before your procedure/surgery. Normally you will be asked to arrive 2-3 Hours in advance of your procedure.    Please [facility] on the day of your procedure     Patient verbalized understanding of instructions provided during pre-anesthesia phone interview.     HOOS and PROMIS done by RN via phone with patient    CBC plt 121 routed to [place] at  Dr. Amey office    Sent above surgical instructions to patient via MyChart message. Encouraged patient to check MyChart for the written surgery instructions.   "

## 2023-12-25 NOTE — Periop Note (Signed)
"        Pre-Admission Testing Algorithm for Identification of Special Population for Anesthesia Review                                                            Patient History Triggers That Require Anesthesia Review & Potential Clearance      [] Anesthesia Reaction History: Malignant Hyperthermia (including family hx of MH) and Pseudocholinesterase Deficiency  [x] Arrhythmias (Significant) or Conduction abnormalities (e.g., A-Fib, A-Flutter, SVT, PSVT, VT, V-Fib, Bradycardia with HR < 40, Sick Sinus Syndrome, Bi/Trigeminy, PACs/PVCs and accessory pathway tachycardias such as Wolf-Parkinson-White)  [] Cardiac History: CHF, CAD, Heart Valve Disorders (Aortic Stenosis, Mitral Regurgitation and Atresia), Cardiomyopathy, Aneurysms, All Ischemia, LBBB, Septal Defects, New/Recent Infarcts and Angina, and Pacemakers/AICDs-  [] Pulmonary History: Severe COPD: Emphysema/Chronic Bronchitis (recent exacerbation within 6 weeks of surgery), Pulmonary Fibrosis, Pulmonary HTN, and on Home Oxygen  [] Recent URI within 30 days of surgery (for example: Covid, pneumonia, bronchitis, etc.)  [] Kidney History: Chronic Renal Failure, on Hemodialysis or Peritoneal Dialysis  [] Hepatic History: Liver Failure, Cirrhosis, Ascites  [] HgA1c > 8.0 or on insulin pump  [] Hgb < 10  [] CVA/Stroke History/Tia  [] Seizure/Epilepsy History  [] PVD/PAD  [] Head and Neck Cancers History: Chemotherapy/Radiation Treatment  [] Bleeding and Clotting Disorders: Hemophilia, Von Willebrand (DDAVP, Factor V Leiden, Factor Deficiencies Disorders, Thrombocytopenia, DIC, Sickle Cell Disease, Hereditary Hemorrhagic Telangiectasia (HHT) and Prothrombin Gene Mutation  [] Rare Diseases: Cystic Fibrosis, Muscular Dystrophies, Phenylketonuria (PKU), Achalasia, etc  [] Autoimmune Diseases(to include the following): Type-1 Diabetes, RA, Lupus, Multiple Sclerosis, Addison's and Graves's Diseases, Hashimoto's Thyroiditis, Myasthenia Gravis, Scleroderma, Sjogren's Syndrome, Pernicious  Anemia, Autoimmune Vasculitis  [] Transplant History: Organ or Bone Marrow   [] Abnormal Labs: Elevated Creatinine, Elevated Liver Enzymes, Elevated and Low Potassium and Sodium, Low Platelets   [] Blood Clots History: DVT/PE (if treated with anticoagulation- request instructions for Pre-Op management)-Do Not Require Anesthesia Review  [] If BMI 50 or greater, then sent chart to anesthesia for review for possible airway check.  [] Patient Does not meet Spec Pop Criteria    Patient meets criteria for Spec Pop- Notified Brandon Montoya/Brandon Montoya posting via email.   "

## 2024-01-07 ENCOUNTER — Ambulatory Visit: Admit: 2024-01-07 | Payer: Medicare (Managed Care) | Primary: Family Medicine

## 2024-01-07 ENCOUNTER — Inpatient Hospital Stay: Payer: Medicare (Managed Care) | Attending: Orthopaedic Surgery

## 2024-01-07 LAB — POCT GLUCOSE: POC Glucose: 108 mg/dL (ref 70–110)

## 2024-01-07 LAB — TYPE AND SCREEN
ABO/Rh: B POS
Antibody Screen: NEGATIVE

## 2024-01-07 MED ORDER — CEFAZOLIN 2000 MG IN 20 ML SWFI IV SYRINGE (PREMIX)
Status: AC
Start: 2024-01-07 — End: 2024-01-07
  Administered 2024-01-07: 13:00:00 2000 mg via INTRAVENOUS

## 2024-01-07 MED ORDER — SODIUM CHLORIDE 0.9 % IR SOLN
0.9 | Status: AC | PRN
Start: 2024-01-07 — End: 2024-01-07
  Administered 2024-01-07: 13:00:00 2000

## 2024-01-07 MED ORDER — DEXAMETHASONE SODIUM PHOSPHATE 4 MG/ML IJ SOLN
4 | Freq: Once | INTRAMUSCULAR | Status: DC | PRN
Start: 2024-01-07 — End: 2024-01-07
  Administered 2024-01-07: 13:00:00 8 via INTRAVENOUS

## 2024-01-07 MED ORDER — SODIUM CHLORIDE 0.9 % IV SOLN
0.9 | INTRAVENOUS | Status: DC | PRN
Start: 2024-01-07 — End: 2024-01-07

## 2024-01-07 MED ORDER — PHENYLEPHRINE HCL 1 MG/10ML IV SOSY
1 | INTRAVENOUS | Status: AC
Start: 2024-01-07 — End: 2024-01-07

## 2024-01-07 MED ORDER — ONDANSETRON HCL 4 MG/2ML IJ SOLN
4 | Freq: Four times a day (QID) | INTRAMUSCULAR | Status: DC | PRN
Start: 2024-01-07 — End: 2024-01-07

## 2024-01-07 MED ORDER — DEXAMETHASONE SODIUM PHOSPHATE 4 MG/ML IJ SOLN
4 | INTRAMUSCULAR | Status: AC
Start: 2024-01-07 — End: 2024-01-07

## 2024-01-07 MED ORDER — LIDOCAINE HCL (CARDIAC) PF 100 MG/5ML IV SOLN
100 | Freq: Once | INTRAVENOUS | Status: DC | PRN
Start: 2024-01-07 — End: 2024-01-07
  Administered 2024-01-07: 13:00:00 100 via INTRAVENOUS

## 2024-01-07 MED ORDER — OXYCODONE-ACETAMINOPHEN 5-325 MG PO TABS
5-325 | ORAL_TABLET | Freq: Four times a day (QID) | ORAL | 0 refills | 6.00000 days | Status: AC | PRN
Start: 2024-01-07 — End: 2024-01-14
  Filled 2024-01-07: qty 28, 7d supply, fill #0

## 2024-01-07 MED ORDER — TRANEXAMIC ACID-NACL 1000-0.7 MG/100ML-% IV SOLN
1000-0.7 | Freq: Once | INTRAVENOUS | Status: AC
Start: 2024-01-07 — End: 2024-01-07
  Administered 2024-01-07: 12:00:00 1000 mg via INTRAVENOUS

## 2024-01-07 MED ORDER — CEFAZOLIN 2000 MG IN 20 ML SWFI IV SYRINGE (PREMIX)
Freq: Three times a day (TID) | Status: DC
Start: 2024-01-07 — End: 2024-01-07

## 2024-01-07 MED ORDER — SUGAMMADEX SODIUM 500 MG/5ML IV SOLN
500 | INTRAVENOUS | Status: AC
Start: 2024-01-07 — End: 2024-01-07

## 2024-01-07 MED ORDER — NALOXONE HCL 0.4 MG/ML IJ SOLN
0.4 | INTRAMUSCULAR | Status: DC | PRN
Start: 2024-01-07 — End: 2024-01-07

## 2024-01-07 MED ORDER — LIDOCAINE HCL (CARDIAC) PF 100 MG/5ML IV SOLN
100 | INTRAVENOUS | Status: AC
Start: 2024-01-07 — End: 2024-01-07

## 2024-01-07 MED ORDER — NORMAL SALINE FLUSH 0.9 % IV SOLN
0.9 | Freq: Two times a day (BID) | INTRAVENOUS | Status: DC
Start: 2024-01-07 — End: 2024-01-07

## 2024-01-07 MED ORDER — OXYCODONE HCL 5 MG PO TABS
5 | ORAL | Status: DC | PRN
Start: 2024-01-07 — End: 2024-01-07

## 2024-01-07 MED ORDER — BUPIVACAINE-EPINEPHRINE 0.5% -1:200000 IJ SOLN (MIXTURES ONLY)
INTRAMUSCULAR | Status: DC | PRN
Start: 2024-01-07 — End: 2024-01-07
  Administered 2024-01-07: 14:00:00 32 via INTRAMUSCULAR

## 2024-01-07 MED ORDER — FENTANYL CITRATE (PF) 100 MCG/2ML IJ SOLN
100 | INTRAMUSCULAR | Status: DC | PRN
Start: 2024-01-07 — End: 2024-01-07
  Administered 2024-01-07 (×2): 25 ug via INTRAVENOUS

## 2024-01-07 MED ORDER — ONDANSETRON HCL 4 MG/2ML IJ SOLN
4 | Freq: Once | INTRAMUSCULAR | Status: DC | PRN
Start: 2024-01-07 — End: 2024-01-07

## 2024-01-07 MED ORDER — DEXMEDETOMIDINE HCL 200 MCG/2ML IV SOLN
200 | Freq: Once | INTRAVENOUS | Status: DC | PRN
Start: 2024-01-07 — End: 2024-01-07
  Administered 2024-01-07: 14:00:00 6 via INTRAVENOUS

## 2024-01-07 MED ORDER — MIDAZOLAM HCL 2 MG/2ML IJ SOLN
2 | INTRAMUSCULAR | Status: AC
Start: 2024-01-07 — End: 2024-01-07

## 2024-01-07 MED ORDER — CEFAZOLIN 2000 MG IN 20 ML SWFI IV SYRINGE (PREMIX)
Freq: Once | Status: DC
Start: 2024-01-07 — End: 2024-01-07

## 2024-01-07 MED ORDER — TRANEXAMIC ACID-NACL 1000-0.7 MG/100ML-% IV SOLN
1000-0.7 | Freq: Once | INTRAVENOUS | Status: AC
Start: 2024-01-07 — End: 2024-01-07
  Administered 2024-01-07: 14:00:00 1000 mg via INTRAVENOUS

## 2024-01-07 MED ORDER — ROCURONIUM BROMIDE 50 MG/5ML IV SOLN
50 | Freq: Once | INTRAVENOUS | Status: DC | PRN
Start: 2024-01-07 — End: 2024-01-07
  Administered 2024-01-07: 13:00:00 10 via INTRAVENOUS
  Administered 2024-01-07: 13:00:00 80 via INTRAVENOUS

## 2024-01-07 MED ORDER — ONDANSETRON 4 MG PO TBDP
4 | Freq: Three times a day (TID) | ORAL | Status: DC | PRN
Start: 2024-01-07 — End: 2024-01-07

## 2024-01-07 MED ORDER — LACTATED RINGERS IV SOLN
INTRAVENOUS | Status: DC
Start: 2024-01-07 — End: 2024-01-07

## 2024-01-07 MED ORDER — MORPHINE SULFATE 4 MG/ML IJ SOLN
4 | INTRAMUSCULAR | Status: AC
Start: 2024-01-07 — End: 2024-01-07

## 2024-01-07 MED ORDER — KETOROLAC TROMETHAMINE 30 MG/ML IJ SOLN
30 | INTRAMUSCULAR | Status: AC
Start: 2024-01-07 — End: 2024-01-07

## 2024-01-07 MED ORDER — DROPERIDOL 2.5 MG/ML IJ SOLN
2.5 | Freq: Once | INTRAMUSCULAR | Status: DC | PRN
Start: 2024-01-07 — End: 2024-01-07

## 2024-01-07 MED ORDER — PROPOFOL 200 MG/20ML IV EMUL
200 | INTRAVENOUS | Status: AC
Start: 2024-01-07 — End: 2024-01-07

## 2024-01-07 MED ORDER — DIPHENHYDRAMINE HCL 50 MG/ML IJ SOLN
50 | Freq: Once | INTRAMUSCULAR | Status: DC | PRN
Start: 2024-01-07 — End: 2024-01-07

## 2024-01-07 MED ORDER — PHENYLEPHRINE HCL 1 MG/10ML IV SOSY
1 | Freq: Once | INTRAVENOUS | Status: DC | PRN
Start: 2024-01-07 — End: 2024-01-07
  Administered 2024-01-07: 13:00:00 200 via INTRAVENOUS
  Administered 2024-01-07: 14:00:00 100 via INTRAVENOUS
  Administered 2024-01-07: 13:00:00 300 via INTRAVENOUS
  Administered 2024-01-07: 13:00:00 200 via INTRAVENOUS

## 2024-01-07 MED ORDER — CEFAZOLIN 2000 MG IN 20 ML SWFI IV SYRINGE (PREMIX)
Status: AC
Start: 2024-01-07 — End: 2024-01-07
  Administered 2024-01-07: 16:00:00 2000 mg via INTRAVENOUS

## 2024-01-07 MED ORDER — LABETALOL HCL 5 MG/ML IV SOLN
5 | INTRAVENOUS | Status: DC | PRN
Start: 2024-01-07 — End: 2024-01-07

## 2024-01-07 MED ORDER — ASPIRIN 81 MG PO TBEC
81 | ORAL_TABLET | Freq: Two times a day (BID) | ORAL | 0 refills | 30.00000 days | Status: AC
Start: 2024-01-07 — End: 2024-01-28
  Filled 2024-01-07: qty 42, 21d supply, fill #0

## 2024-01-07 MED ORDER — HYDROMORPHONE HCL 1 MG/ML IJ SOLN
1 | Freq: Once | INTRAMUSCULAR | Status: DC | PRN
Start: 2024-01-07 — End: 2024-01-07
  Administered 2024-01-07 (×2): .5 via INTRAVENOUS

## 2024-01-07 MED ORDER — BUPIVACAINE-EPINEPHRINE (PF) 0.5% -1:200000 IJ SOLN
INTRAMUSCULAR | Status: AC
Start: 2024-01-07 — End: 2024-01-07

## 2024-01-07 MED ORDER — MIDAZOLAM HCL 2 MG/2ML IJ SOLN
2 | Freq: Once | INTRAMUSCULAR | Status: DC | PRN
Start: 2024-01-07 — End: 2024-01-07
  Administered 2024-01-07: 12:00:00 4 via INTRAVENOUS

## 2024-01-07 MED ORDER — DEXMEDETOMIDINE HCL 200 MCG/2ML IV SOLN
200 | INTRAVENOUS | Status: AC
Start: 2024-01-07 — End: 2024-01-07

## 2024-01-07 MED ORDER — GLYCOPYRROLATE 0.4 MG/2ML IJ SOLN
0.4 | Freq: Once | INTRAMUSCULAR | Status: DC | PRN
Start: 2024-01-07 — End: 2024-01-07
  Administered 2024-01-07: 12:00:00 .1 via INTRAVENOUS

## 2024-01-07 MED ORDER — FENTANYL CITRATE (PF) 100 MCG/2ML IJ SOLN
100 | INTRAMUSCULAR | Status: AC
Start: 2024-01-07 — End: 2024-01-07

## 2024-01-07 MED ORDER — TRANEXAMIC ACID-NACL 1000-0.7 MG/100ML-% IV SOLN
1000-0.7 | Freq: Once | INTRAVENOUS | Status: DC | PRN
Start: 2024-01-07 — End: 2024-01-07

## 2024-01-07 MED ORDER — ROCURONIUM BROMIDE 50 MG/5ML IV SOLN
50 | INTRAVENOUS | Status: AC
Start: 2024-01-07 — End: 2024-01-07

## 2024-01-07 MED ORDER — HYDROMORPHONE HCL 1 MG/ML IJ SOLN
1 | INTRAMUSCULAR | Status: AC
Start: 2024-01-07 — End: 2024-01-07

## 2024-01-07 MED ORDER — TRANEXAMIC ACID-NACL 1000-0.7 MG/100ML-% IV SOLN
1000-0.7 | INTRAVENOUS | Status: AC
Start: 2024-01-07 — End: 2024-01-07

## 2024-01-07 MED ORDER — OXYCODONE HCL 5 MG PO TABS
5 | ORAL | Status: DC | PRN
Start: 2024-01-07 — End: 2024-01-07
  Administered 2024-01-07: 16:00:00 10 mg via ORAL

## 2024-01-07 MED ORDER — SUGAMMADEX SODIUM 500 MG/5ML IV SOLN
500 | Freq: Once | INTRAVENOUS | Status: DC | PRN
Start: 2024-01-07 — End: 2024-01-07
  Administered 2024-01-07: 14:00:00 450 via INTRAVENOUS

## 2024-01-07 MED ORDER — OXYCODONE HCL 5 MG PO TABS
5 | Freq: Once | ORAL | Status: DC | PRN
Start: 2024-01-07 — End: 2024-01-07

## 2024-01-07 MED ORDER — GLYCOPYRROLATE 0.4 MG/2ML IJ SOLN
0.4 | INTRAMUSCULAR | Status: AC
Start: 2024-01-07 — End: 2024-01-07

## 2024-01-07 MED ORDER — SODIUM CHLORIDE 0.9 % IR SOLN
0.9 | Status: AC | PRN
Start: 2024-01-07 — End: 2024-01-07
  Administered 2024-01-07: 13:00:00 500

## 2024-01-07 MED ORDER — HYDROMORPHONE HCL PF 1 MG/ML IJ SOLN
1 | INTRAMUSCULAR | Status: AC | PRN
Start: 2024-01-07 — End: 2024-01-07
  Administered 2024-01-07 (×2): 0.5 mg via INTRAVENOUS

## 2024-01-07 MED ORDER — LACTATED RINGERS IV SOLN
INTRAVENOUS | Status: DC
Start: 2024-01-07 — End: 2024-01-07
  Administered 2024-01-07 (×3): via INTRAVENOUS

## 2024-01-07 MED ORDER — ACETAMINOPHEN 500 MG PO TABS
500 | Freq: Once | ORAL | Status: AC
Start: 2024-01-07 — End: 2024-01-07
  Administered 2024-01-07: 12:00:00 1000 mg via ORAL

## 2024-01-07 MED ORDER — NORMAL SALINE FLUSH 0.9 % IV SOLN
0.9 | INTRAVENOUS | Status: DC | PRN
Start: 2024-01-07 — End: 2024-01-07

## 2024-01-07 MED ORDER — PROPOFOL 200 MG/20ML IV EMUL
200 | Freq: Once | INTRAVENOUS | Status: DC | PRN
Start: 2024-01-07 — End: 2024-01-07
  Administered 2024-01-07: 13:00:00 200 via INTRAVENOUS

## 2024-01-07 MED ORDER — FENTANYL CITRATE (PF) 100 MCG/2ML IJ SOLN
100 | Freq: Once | INTRAMUSCULAR | Status: DC | PRN
Start: 2024-01-07 — End: 2024-01-07
  Administered 2024-01-07: 12:00:00 100 via INTRAVENOUS

## 2024-01-07 MED FILL — ACETAMINOPHEN EXTRA STRENGTH 500 MG PO TABS: 500 mg | ORAL | Qty: 2 | Fill #0

## 2024-01-07 MED FILL — FENTANYL CITRATE (PF) 100 MCG/2ML IJ SOLN: 100 MCG/2ML | INTRAMUSCULAR | Qty: 2 | Fill #0

## 2024-01-07 MED FILL — BD POSIFLUSH 0.9 % IV SOLN: 0.9 % | INTRAVENOUS | Qty: 40 | Fill #0

## 2024-01-07 MED FILL — LIDOCAINE HCL (CARDIAC) PF 100 MG/5ML IV SOLN: 100 MG/5ML | INTRAVENOUS | Qty: 5 | Fill #0

## 2024-01-07 MED FILL — KETOROLAC TROMETHAMINE 30 MG/ML IJ SOLN: 30 mg/mL | INTRAMUSCULAR | Qty: 1 | Fill #0

## 2024-01-07 MED FILL — MORPHINE SULFATE 4 MG/ML IJ SOLN: 4 mg/mL | INTRAMUSCULAR | Qty: 1 | Fill #0

## 2024-01-07 MED FILL — PHENYLEPHRINE HCL 1 MG/10ML IV SOSY: 1 MG/0ML | INTRAVENOUS | Qty: 10 | Fill #0

## 2024-01-07 MED FILL — BRIDION 500 MG/5ML IV SOLN: 500 MG/5ML | INTRAVENOUS | Qty: 5 | Fill #0

## 2024-01-07 MED FILL — HYDROMORPHONE HCL 1 MG/ML IJ SOLN: 1 mg/mL | INTRAMUSCULAR | Qty: 1 | Fill #0

## 2024-01-07 MED FILL — DEXMEDETOMIDINE HCL 200 MCG/2ML IV SOLN: 200 MCG/2ML | INTRAVENOUS | Qty: 2 | Fill #0

## 2024-01-07 MED FILL — LACTATED RINGERS IV SOLN: INTRAVENOUS | Qty: 1000 | Fill #0

## 2024-01-07 MED FILL — DEXAMETHASONE SODIUM PHOSPHATE 4 MG/ML IJ SOLN: 4 mg/mL | INTRAMUSCULAR | Qty: 2 | Fill #0

## 2024-01-07 MED FILL — DEXAMETHASONE SODIUM PHOSPHATE 4 MG/ML IJ SOLN: 4 mg/mL | INTRAMUSCULAR | Qty: 1 | Fill #0

## 2024-01-07 MED FILL — TRANEXAMIC ACID-NACL 1000-0.7 MG/100ML-% IV SOLN: 1000-0.7 MG/100ML-% | INTRAVENOUS | Qty: 200 | Fill #0

## 2024-01-07 MED FILL — CEFAZOLIN 2000 MG IN 20 ML SWFI IV SYRINGE (PREMIX): Qty: 2000 | Fill #0

## 2024-01-07 MED FILL — MIDAZOLAM HCL 2 MG/2ML IJ SOLN: 2 mg/mL | INTRAMUSCULAR | Qty: 2 | Fill #0

## 2024-01-07 MED FILL — PROPOFOL 200 MG/20ML IV EMUL: 200 MG/20ML | INTRAVENOUS | Qty: 20 | Fill #0

## 2024-01-07 MED FILL — ROCURONIUM BROMIDE 50 MG/5ML IV SOLN: 50 MG/5ML | INTRAVENOUS | Qty: 10 | Fill #0

## 2024-01-07 MED FILL — PHENYLEPHRINE HCL (PRESSORS) 1 MG/10ML IV SOSY: 1 MG/0ML | INTRAVENOUS | Qty: 10 | Fill #0

## 2024-01-07 MED FILL — MARCAINE/EPINEPHRINE PF 0.5% -1:200000 IJ SOLN: INTRAMUSCULAR | Qty: 30 | Fill #0

## 2024-01-07 MED FILL — GLYCOPYRROLATE 0.4 MG/2ML IJ SOLN: 0.4 MG/2ML | INTRAMUSCULAR | Qty: 2 | Fill #0

## 2024-01-07 MED FILL — OXYCODONE HCL 5 MG PO TABS: 5 mg | ORAL | Qty: 2 | Fill #0

## 2024-01-07 NOTE — Anesthesia Pre Procedure (Signed)
 "Department of Anesthesiology  Preprocedure Note       Name:  Brandon Montoya.   Age:  58 y.o.  DOB:  02-07-1965                                          MRN:  [id number]         Date:  01/07/2024      Surgeon: Clotilde):  Stevan Yancy DASEN, MD    Procedure: Procedure(s):  LEFT TOTAL HIP REPLACEMENT ANTERIOR APPROACH WITH C-ARM *SPEC POP*    Medications prior to admission:   Prior to Admission medications   Medication Sig Start Date End Date Taking? Authorizing Provider   pantoprazole  (PROTONIX ) 40 MG tablet Take 1 tablet by mouth every morning Indications: GERD   Yes [provider]   atorvastatin  (LIPITOR) 40 MG tablet Take 1 tablet by mouth every morning Indications: high cholesterol   Yes [provider]   montelukast  (SINGULAIR ) 10 MG tablet Take 1 tablet by mouth nightly Indications: allergies   Yes [provider]   solifenacin (VESICARE) 10 MG tablet Take 1 tablet by mouth every morning Indications: bladder   Yes [provider]   FLUOXETINE  HCL PO Take 80 mg by mouth 2 times daily   Yes [provider]   QUEtiapine  (SEROQUEL ) 400 MG tablet Take 1 tablet by mouth at bedtime Indications: Anxiety/depression/PTSD   Yes [provider]   clonazePAM  (KLONOPIN ) 2 MG tablet Take 1 tablet by mouth 2 times daily. Indications: Anxiety/depression/PTSD   Yes [provider]       Current medications:    Current Facility-Administered Medications   Medication Dose Route Frequency Provider Last Rate Last Admin    lactated ringers  infusion   IntraVENous Continuous Stevan Yancy DASEN, MD 125 mL/hr at 01/07/24 0600 New Bag at 01/07/24 0600    ceFAZolin  (ANCEF ) 2000 mg in sterile water  20 mL IV syringe  2,000 mg IntraVENous On Call to OR Stevan Yancy DASEN, MD        tranexamic acid -NaCl IVPB premix 1,000 mg  1,000 mg IntraVENous Once Baddar, Adrian T, MD        tranexamic acid -NaCl IVPB premix 1,000 mg  1,000 mg IntraVENous Once Baddar, Adrian T, MD        acetaminophen   (TYLENOL ) tablet 1,000 mg  1,000 mg Oral Once Juli Dorn BIRCH, MD           Allergies:    Allergies   Allergen Reactions    Eletriptan Anaphylaxis, Hives, Shortness Of Breath, Swelling and Angioedema    Indomethacin Nausea And Vomiting       Problem List:    Patient Active Problem List   Diagnosis Code    Abnormal glucose level R73.09    Allergic conjunctivitis H10.10    Pain in joint involving pelvic region and thigh M25.559    Aseptic necrosis of bone (HCC) M87.00    Aspiration pneumonia of lower lobe (HCC) J69.0    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    Bipolar 1 disorder (HCC) F31.9    Breast pain N64.4    Closed fracture of metacarpal bone S62.309A    Constipation K59.00    Enlarged prostate N40.0    Enthesopathy of hip region M76.899    Gallstone pancreatitis K85.10    Gastroesophageal reflux disease K21.9    History of arthroplasty of left knee S03.347  Hyperlipidemia E78.5    Hypertrophy of breast N62    Insomnia G47.00    Osteoarthritis of knee M17.9    Loss of consciousness (HCC) R40.20    Chronic low back pain M54.50, G89.29    Obesity with body mass index 30 or greater E66.9    Old anterior cruciate ligament disruption M23.50    Panic disorder without agoraphobia F41.0    Plantar fasciitis M72.2    Post-traumatic stress disorder, unspecified F43.10    Recurrent major depression F33.9    Sleep apnea, unspecified G47.30    Vitamin D deficiency E55.9    Artificial knee joint present Z96.659    Cervicalgia M54.2    Cocaine use F14.90    Elbow pain M25.529    Erectile dysfunction N52.9    History of colonic polyps Z86.0100    Hypotension I95.9    Prediabetes R73.03    Cervical radiculopathy due to osteoarthritis of spine M47.22    Major depressive disorder, 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 no cardio     Anxiety, depression, PTSD 1994    on meds. served in [employer]    Arthritis 2019    hx bilateral knee surgery & revisions, cervical    BPH (benign prostatic hyperplasia) 2025    on med[facility]. [facility]    Chronic pain 2022    bilateral knees & lower back. hx back and knee replacement surgeries    Exercise tolerance finding 12/25/2023    CAN go up/down one flight of stairs without SOB/chest pain    GERD 2002    on meds    History of blood transfusion 2019    in [place]  with one of the knee replacement surgery    Hx of chest pain 2019    & syncope- resolved. no cardiologist    Hyperlipidemia 2019    on meds. PCP Dr. Allen Mugaisi    Marijuana use     positive for Opioids 01/09/2022 no longer using    Sleep apnea     CPAP intolerance    Wears reading eyeglasses        Past Surgical History:        Procedure Laterality Date    BACK SURGERY  2023    back fracture -hardware (rods & screws)    CARPAL TUNNEL RELEASE Left 12/03/2023    left carptal tunnel release and left dequervains first extensor release- Dr. Stevan    CERVICAL FUSION N/A 05/20/2022    anterior cervical decompression fusion cervical six/seven, cervical seven/thoracic one- Dr. Alaine SHACKLE, LAPAROSCOPIC  07/03/2017    Dr. Lang    COLONOSCOPY  03/2023    ESOPHAGOGASTRODUODENOSCOPY  09/30/2018    & 2020    KNEE ARTHROPLASTY Bilateral 2019    with revisions    LUMBAR FUSION N/A 08/28/2022    lumbar two/three fusion extension thoracic ten- lumbar two fusion- Dr. Alaine    MASTECTOMY Right 2000    due to gynecomastia    XR MIDLINE EQUAL OR GREATER THAN 5 YEARS  09/20/2022    XR MIDLINE EQUAL OR GREATER THAN 5 YEARS 09/20/2022       Social History:    Social History     Tobacco Use  Smoking status: Former     Types: Cigarettes, Cigars     Start date: 2000     Passive exposure: Current    Smokeless tobacco: Never   Substance Use Topics    Alcohol use: Not Currently     Comment: rarely                                Counseling given:  Not Answered      Vital Signs (Current):   Vitals:    01/07/24 0536   BP: 124/76   Pulse: 93   Resp: 16   Temp: 97.5 F (36.4 C)   TempSrc: Temporal   SpO2: 100%   Weight: 111.4 kg (245 lb 9.6 oz)   Height: 1.854 m (6' 0.99)                                              BP Readings from Last 3 Encounters:   01/07/24 124/76   12/10/23 121/84   12/03/23 (!) 141/93       NPO Status: Time of last liquid consumption: 2100                        Time of last solid consumption: 1945                        Date of last liquid consumption: 01/06/24                        Date of last solid food consumption: 01/06/24    BMI:   Wt Readings from Last 3 Encounters:   01/07/24 111.4 kg (245 lb 9.6 oz)   12/25/23 107.5 kg (237 lb)   12/10/23 107.5 kg (237 lb)     Body mass index is 32.41 kg/m.    CBC:   Lab Results   Component Value Date/Time    WBC 5.2 11/27/2023 11:55 AM    RBC 4.37 11/27/2023 11:55 AM    RBC 4.52 02/20/2022 02:47 PM    HGB 13.9 11/27/2023 11:55 AM    HCT 40.8 11/27/2023 11:55 AM    MCV 93.4 11/27/2023 11:55 AM    RDW 12.4 11/27/2023 11:55 AM    PLT 121 11/27/2023 11:55 AM       CMP:   Lab Results   Component Value Date/Time    NA 140 11/27/2023 11:55 AM    K 3.7 11/27/2023 11:55 AM    CL 101 11/27/2023 11:55 AM    CO2 28 11/27/2023 11:55 AM    BUN 11 11/27/2023 11:55 AM    CREATININE 0.98 11/27/2023 11:55 AM    AGRATIO 1.3 12/18/2021 10:25 AM    LABGLOM >90 11/27/2023 11:55 AM    LABGLOM >60.0 02/20/2022 02:47 PM    GLUCOSE 94 11/27/2023 11:55 AM    GLUCOSE 89 02/20/2022 02:47 PM    CALCIUM  9.7 11/27/2023 11:55 AM    BILITOT 0.4 11/27/2023 11:55 AM    ALKPHOS 97 11/27/2023 11:55 AM    ALKPHOS 112 12/18/2021 10:25 AM    AST 22 11/27/2023 11:55 AM    ALT 12 11/27/2023 11:55 AM       POC Tests:   Recent Labs     01/07/24  9447   POCGLU 108       Coags:   Lab Results   Component Value Date/Time    PROTIME 13.1 02/24/2023 01:29 PM    INR 1.0 02/24/2023 01:29 PM    APTT 26.5 02/24/2023 01:29 PM       HCG (If  Applicable): No results found for: PREGTESTUR, PREGSERUM, HCG, HCGQUANT     ABGs: No results found for: PHART, PO2ART, PCO2ART, HCO3ART, BEART, O2SATART     Type & Screen (If Applicable):  Lab Results   Component Value Date    ABORH B POSITIVE 08/28/2022    LABANTI NEG 08/28/2022       Drug/Infectious Status (If Applicable):  Lab Results   Component Value Date/Time    HEPCAB Non Reactive 12/10/2023 09:10 AM       COVID-19 Screening (If Applicable): No results found for: COVID19        Anesthesia Evaluation    Patient summary reviewed and Nursing notes reviewed     no history of anesthetic complications:  Airway:  Mallampati: II  TM distance: >3 FB   Neck ROM: full    Mouth opening: > = 3 FB   Dental:  normal exam         Pulmonary:       (+)               sleep apnea: on noncompliant                   (-) pneumonia         Cardiovascular:     Exercise tolerance: good (>4 METS)    (+)                           hyperlipidemia                            Neuro/Psych:    Negative Neuro/Psych ROS             GI/Hepatic/Renal:    (+)   GERD: no interval change                Endo/Other:  Negative Endo/Other ROS                    Abdominal:              Vascular:  negative vascular ROS.       Other Findings:            Anesthesia Plan      general     ASA 3       Induction: intravenous.    MIPS: Postoperative opioids intended.  Anesthetic plan and risks discussed with patient and spouse.      Plan discussed with CRNA.    Attending anesthesiologist reviewed and agrees with Preprocedure content        Not interested in spinal.        Dorn Cleverly, MD   01/07/2024            "

## 2024-01-07 NOTE — H&P (Signed)
 "    [facility]  History and Physical Exam    Patient: Brandon Montoya. MRN: [id number]  SSN: [id number]    Date of Birth: [birth year]  Age: 58 y.o.  Sex: male      Subjective:      Chief Complaint: left hip pain    History of Present Illness:  Patient complains of hip pain on the affected side and difficulty ambulating. Pain is increased with increasing activity. Pain is increased with weight bearing. Pain interferes with activities of daily living. Patient has noticed decreased range of motion and difficulty putting on shoes and socks.    Past Medical History:   Diagnosis Date    Abnormal EKG 11/27/2023    PACs per EKG. no cardio    Anxiety, depression, PTSD 1994    on meds. served in [employer]    Arthritis 2019    hx bilateral knee surgery & revisions, cervical    BPH (benign prostatic hyperplasia) 2025    on meds. [facility]    Chronic pain 2022    bilateral knees & lower back. hx back and knee replacement surgeries    Exercise tolerance finding 12/25/2023    CAN go up/down one flight of stairs without SOB/chest pain    GERD 2002    on meds    History of blood transfusion 2019    in [place]  with one of the knee replacement surgery    Hx of chest pain 2019    & syncope- resolved. no cardiologist    Hyperlipidemia 2019    on meds. PCP Dr. Allen Mugaisi    Marijuana use     positive for Opioids 01/09/2022 no longer using    Sleep apnea     CPAP intolerance    Wears reading eyeglasses      Past Surgical History:   Procedure Laterality Date    BACK SURGERY  2023    back fracture -hardware (rods & screws)    CARPAL TUNNEL RELEASE Left 12/03/2023    left carptal tunnel release and left dequervains first extensor release- Dr. Stevan    CERVICAL FUSION N/A 05/20/2022    anterior cervical decompression fusion cervical six/seven, cervical seven/thoracic one- Dr. Alaine SHACKLE, LAPAROSCOPIC  07/03/2017    Dr. Lang    COLONOSCOPY  03/2023     ESOPHAGOGASTRODUODENOSCOPY  09/30/2018    & 2020    KNEE ARTHROPLASTY Bilateral 2019    with revisions    LUMBAR FUSION N/A 08/28/2022    lumbar two/three fusion extension thoracic ten- lumbar two fusion- Dr. Alaine    MASTECTOMY Right 2000    due to gynecomastia    XR MIDLINE EQUAL OR GREATER THAN 5 YEARS  09/20/2022    XR MIDLINE EQUAL OR GREATER THAN 5 YEARS 09/20/2022     Social History     Occupational History    Not on file   Tobacco Use    Smoking status: Former     Types: Cigarettes, Cigars     Start date: 2000     Passive exposure: Current    Smokeless tobacco: Never   Vaping Use    Vaping status: Never Used   Substance and Sexual Activity    Alcohol use: Not Currently     Comment: rarely    Drug use: Not Currently     Types: Marijuana Oda)     Comment: last used june/ July 2025. hold 7 days  before surgery    Sexual activity: Not on file     Prior to Admission medications   Medication Sig Start Date End Date Taking? Authorizing Provider   pantoprazole  (PROTONIX ) 40 MG tablet Take 1 tablet by mouth every morning Indications: GERD   Yes [provider]   atorvastatin  (LIPITOR) 40 MG tablet Take 1 tablet by mouth every morning Indications: high cholesterol   Yes [provider]   montelukast  (SINGULAIR ) 10 MG tablet Take 1 tablet by mouth nightly Indications: allergies   Yes [provider]   solifenacin (VESICARE) 10 MG tablet Take 1 tablet by mouth every morning Indications: bladder   Yes [provider]   FLUOXETINE  HCL PO Take 80 mg by mouth 2 times daily   Yes [provider]   QUEtiapine  (SEROQUEL ) 400 MG tablet Take 1 tablet by mouth at bedtime Indications: Anxiety/depression/PTSD   Yes [provider]   clonazePAM  (KLONOPIN ) 2 MG tablet Take 1 tablet by mouth 2 times daily. Indications: Anxiety/depression/PTSD   Yes [provider]     History reviewed. No pertinent family history.    Allergies:   Allergies   Allergen Reactions     Eletriptan Anaphylaxis, Hives, Shortness Of Breath, Swelling and Angioedema    Indomethacin Nausea And Vomiting        Review of Systems:  A comprehensive review of systems was negative.    Objective:       Physical Exam:  General:  Alert, oriented, pleasant, well-groomed  HEENT:  Normocephalic, atraumatic  Lungs:   Clear to auscultation  Heart:    Regular rate and rhythm  Abdomen:  Soft, non-tender  Extremities:   Pain with motion of the the affected hip    Crepitus with motion of the affected hip    Limited range of motion of the affected hip    Mild effusion       Xrays:   Xrays demonstrate severe arthritic changes including sunchondral cysts, subchondral sclerosis, periarticular osteophytes, and joint space narrowing.    Assessment:      Arthritis of the affected hip. This has significantly affected the patient's quality of life. It interferes with activities of daily living. It is worse with weight bearing and activity.    Plan:       Proceed with scheduled left hip.    The patient has failed conservative measures including anti-inflammatories, injections, activity modification, and a trial of physical therapy or external joint support which includes a home exercise program.    The various methods of treatment have been discussed with the patient and family. After consideration of risks, benefits, and other options for treatment, the patient has consented to surgical interventions. Questions were answered and preoperative teaching was done by myself.     Signed By: Tanda LOISE Sharps, PA-C     January 07, 2024       "

## 2024-01-07 NOTE — Periop Note (Signed)
"  No intake/output data recorded.  I/O this shift:  In: 2950 [P.O.:150; I.V.:2800]  Out: 500 [Blood:500]      "

## 2024-01-07 NOTE — Periop Note (Signed)
"    Intake/Output Summary (Last 24 hours) at 01/07/2024 1015  Last data filed at 01/07/2024 1015  Gross per 24 hour   Intake 2500 ml   Output 500 ml   Net 2000 ml      "

## 2024-01-07 NOTE — Periop Note (Signed)
"  Reviewed PTA medication list with patient/caregiver and patient/caregiver denies any additional medications.     Patient admits to having a responsible adult care for them at home for at least 24 hours after surgery.    Patient encouraged to use gown warming system and informed that using said warming gown to regulate body temperature prior to a procedure has been shown to help reduce the risks of blood clots and infection.    Patient's pharmacy of choice verified and documented in PTA medication section.    Dual skin assessment & fall risk band verification completed with Charleen CROME RN.     "

## 2024-01-07 NOTE — Interval H&P Note (Signed)
"  Update History & Physical    The patient's History and Physical was reviewed with the patient and I examined the patient. There was no change. The surgical site was confirmed by the patient and me.     Plan: The risks, benefits, expected outcome, and alternative to the recommended procedure have been discussed with the patient. Patient understands and wants to proceed with the procedure.     Electronically signed by YANCY ONEIDA STAKES, MD on 01/07/2024 at 7:17 AM      "

## 2024-01-07 NOTE — Periop Note (Signed)
"  Pt up to bathroom to void with assistance. Returned to stretcher with call light in reach.    "

## 2024-01-07 NOTE — Progress Notes (Signed)
 "  Physical Therapy Goals:  Pt goals to be met in 1 day:  Pt will be able to perform supine<>sit SBA for transfer ease at home.  Pt will be able to perform sit<>stand SBA for increased ability to transfer at home safely.  Pt will be able to participate in gait training >100' w/ RW, WBAT, GB and CGA/SBA for improved ability in home upon d/c.  Pt will be able to perform stair training step to pattern, B/U rail and CGA to obtain safe entry into home upon d/c.  Pt will be educated regarding HEP per MD protocol for optimal AROM/strength outcomes.        [x]   Patient has met MD mobilization critieria for d/c home   [x]   HH with 24 hour adult care   []   Benefit from additional acute PT session to address:      PHYSICAL THERAPY EVALUATION    Patient: Brandon Montoya. (58 y.o. male)  Date: 01/07/2024  Primary Diagnosis: Primary osteoarthritis of left hip [M16.12]  Procedure(s) (LRB):  LEFT TOTAL HIP REPLACEMENT ANTERIOR APPROACH WITH C-ARM *SPEC POP* (Left) * Day of Surgery *   Precautions: Weight Bearing, Surgical Protocols, Fall Risk,  , Left Lower Extremity Weight Bearing: Weight Bearing As Tolerated,  ,  ,  ,  , Hip Precautions: Anterior hip precautions, No sudden or extreme motions  PLOF: Used SPC for ambulation as needed; independent     ASSESSMENT :  Introduced self to pt and family, ed on role of PT and pt agreeable to PT evaluation.  Based on the objective data described below, the patient presents with decreased mobility in regards to bed mobility, transfers, gait quality, gait tolerance, balance, stair negotiation and safety due to L THA surgery.  Decreased AROM of L hip, dec strength L hip, pain in L hip, decreased sensation of L hip also impacting functional mobility.  Using numerical pain scale, pt rated pain 6/10 pre/during/post session.  Pt and wife ed regarding mobility safety, WB, HEP, ice application/use, elevation, environmental safety and home safe techniques.  Pt sitting in recliner upon arrival.  Pt  assisted w/ dressing.  Able to perform sit<>stand w/ CGA/SBA.  Safety vc required throughout session to reinforce safety.  Gait training using RW, GB, WBAT and CGA w/ antalgic gait pattern.  Step to pattern progressing to step through.  Stair training using step to pattern, B rail use and CGA.  Answered all questions by pt and wife in regards to PT and mobility.  Pt left sitting in wheelchair w/ all needs within reach and ice pack to L hip.  Nurse Veleria aware of session and outcomes.  Consider continued PT w/ responsible adult care at least 24 hours upon hospital d/c.    DEFICITS/IMPAIRMENTS:    , Body Structures, Functions, Activity Limitations Requiring Skilled Therapeutic Intervention: Decreased functional mobility ;Decreased strength;Decreased safe awareness;Decreased endurance;Decreased balance;Decreased coordination;Decreased ROM    Patient will benefit from skilled intervention to address the above impairments.  Patient's rehabilitation potential/Therapy Prognosis: Good.  Factors which may influence rehabilitation potential include:   []          None noted  []          Mental ability/status  []          Medical condition  []          Home/family situation and support systems  []          Safety awareness  [x]   Pain tolerance/management  []          Other:        PLAN :  Recommendations and Planned Interventions:   [x]            Bed Mobility Training             [x]     Neuromuscular Re-Education  [x]            Transfer Training                   []     Orthotic/Prosthetic Training  [x]            Gait Training                          []     Modalities  [x]            Therapeutic Exercises           []     Edema Management/Control  [x]            Therapeutic Activities            [x]     Family Training/Education  [x]            Patient Education  []            Other (comment):    Frequency/Duration: Patient will be followed by physical therapy to address goals, 1-2 times per day/3-5 days per week to address  goals.    Further Equipment Recommendations for Discharge: none    AMPAC: AM-PAC Inpatient Mobility Raw Score : 18      Current research shows that an AM-PAC score of 17 (13 without stairs) or less is not associated with a discharge to the patient's home setting. Based on an AM-PAC score and their current functional mobility deficits, it is recommended that the patient have 5-7 sessions per week of Physical Therapy at d/c to increase the patient's independence.  Currently, this patient demonstrates the potential endurance, and/or tolerance for 3 hours of therapy each day at d/c.    Current research shows that an AM-PAC score of 17 (13 without stairs) or less is not associated with a discharge to the patient's home setting. Based on an AM-PAC score and their current functional mobility deficits, it is recommended that the patient have 3-5 sessions per week of Physical Therapy at d/c to increase the patient's independence.     Current research shows that an AM-PAC score of 18 (14 without stairs) or greater is associated with a discharge to the patient's home setting. Based on an AM-PAC score and their current functional mobility deficits, it is recommended that the patient have 2-3 sessions per week of Physical Therapy at d/c to increase the patient's independence.    At this time and based on an AM-PAC score, no further PT is recommended upon discharge due to (i.e. patient at baseline functional statusetc).  Recommend patient returns to prior setting with prior services.    This AMPAC score should be considered in conjunction with interdisciplinary team recommendations to determine the most appropriate discharge setting. Patient's social support, diagnosis, medical stability, and prior level of function should also be taken into consideration.     SUBJECTIVE:   Patient stated I have had twelve knee surgeries, this will be okay.    OBJECTIVE DATA SUMMARY:     Past Medical History:   Diagnosis Date    Abnormal EKG  11/27/2023  PACs per EKG. no cardio    Anxiety, depression, PTSD 1994    on meds. served in [employer]    Arthritis 2019    hx bilateral knee surgery & revisions, cervical    BPH (benign prostatic hyperplasia) 2025    on meds. see [facility]    Chronic pain 2022    bilateral knees & lower back. hx back and knee replacement surgeries    Exercise tolerance finding 12/25/2023    CAN go up/down one flight of stairs without SOB/chest pain    GERD 2002    on meds    History of blood transfusion 2019    in [place]  with one of the knee replacement surgery    Hx of chest pain 2019    & syncope- resolved. no cardiologist    Hyperlipidemia 2019    on meds. PCP Dr. Allen Mugaisi    Marijuana use     positive for Opioids 01/09/2022 no longer using    Sleep apnea     CPAP intolerance    Wears reading eyeglasses      Past Surgical History:   Procedure Laterality Date    BACK SURGERY  2023    back fracture -hardware (rods & screws)    CARPAL TUNNEL RELEASE Left 12/03/2023    left carptal tunnel release and left dequervains first extensor release- Dr. Stevan    CERVICAL FUSION N/A 05/20/2022    anterior cervical decompression fusion cervical six/seven, cervical seven/thoracic one- Dr. Alaine SHACKLE, LAPAROSCOPIC  07/03/2017    Dr. Lang    COLONOSCOPY  03/2023    ESOPHAGOGASTRODUODENOSCOPY  09/30/2018    & 2020    KNEE ARTHROPLASTY Bilateral 2019    with revisions    LUMBAR FUSION N/A 08/28/2022    lumbar two/three fusion extension thoracic ten- lumbar two fusion- Dr. Alaine    MASTECTOMY Right 2000    due to gynecomastia    XR MIDLINE EQUAL OR GREATER THAN 5 YEARS  09/20/2022    XR MIDLINE EQUAL OR GREATER THAN 5 YEARS 09/20/2022       Home Situation:  Social/Functional History  Lives With: Spouse  Type of Home: House  Home Layout: One level  Home Access: Level entry  Bathroom Shower/Tub: Walk-in Physiological Scientist: Standard  Bathroom Equipment: Programmer, applications, Grab bars in shower  Home  Equipment: Environmental Consultant - Rolling, Medical Laboratory Scientific Officer  Critical Behavior:  Orientation  Orientation Level: Oriented to place;Oriented to situation;Oriented to person  Cognition  Overall Cognitive Status: WFL  Strength:    Strength: Generally decreased, functional  Tone & Sensation:   Tone: Normal  Sensation: Impaired (L hip)  Range Of Motion:  AROM: Generally decreased, functional  PROM: Generally decreased, functional  Functional Mobility:  Bed Mobility:  Bed Mobility Training  Bed Mobility Training: No  Transfers:  Art Therapist: Yes  Interventions: Safety awareness training;Verbal cues;Tactile cues  Sit to Stand: Contact guard assistance  Stand to Sit: Contact guard assistance;Stand by assistance  Balance:   Balance  Sitting: Intact  Standing: Impaired  Standing - Static: Fair;Good;Constant support  Standing - Dynamic: Fair;Constant support  Wheelchair Mobility:  Ambulation/Gait Training:  Gait  Gait Training: Yes  Left Side Weight Bearing: As tolerated  Overall Level of Assistance: Contact guard assistance  Distance (ft): 160 Feet  Assistive Device: Gait belt;Walker, rolling  Interventions: Safety awareness training;Verbal cues  Base of Support: Shift to right  Speed/Cadence: Slow  Step Length:  Left shortened;Right shortened  Swing Pattern: Left asymmetrical;Right asymmetrical  Stance: Left decreased  Gait Abnormalities: Antalgic;Decreased step clearance  Rail Use: Both  Number of Stairs Trained: 5  Therapeutic Exercises/Neuromuscular Re-education:     Pain:  Pain level pre-treatment: 6/10   Pain level post-treatment: 6/10   Pain Intervention(s): Medication (see MAR); Rest, Ice, Repositioning  Response to intervention: Nurse notified     Activity Tolerance:   Activity Tolerance: Patient tolerated evaluation without incident  Please refer to the flowsheet for vital signs taken during this treatment.    After treatment:   [x]          Patient left in no apparent distress sitting up in wheelchair  []           Patient left in no apparent distress in bed  [x]          Call bell left within reach  [x]          Nursing notified  [x]          Wife present  []          Bed alarm activated  []          SCDs applied    COMMUNICATION/EDUCATION:   Patient Education  Education Given To: Pension Scheme Manager  Education Provided: Role of Designer, Industrial/product;Fall Prevention Strategies;Home Exercise Program;Precautions;Family Education;Equipment;Mobility Training;Energy Conservation  Education Method: Demonstration;Verbal;Teach Back  Barriers to Learning: Readiness to Learn  Education Outcome: Verbalized understanding;Demonstrated understanding;Continued education needed    Thank you for this referral.  Tedi Horns, PT  Minutes: 16      Eval Complexity: Decision Making: Low Complexity    "

## 2024-01-07 NOTE — Periop Note (Signed)
"  Patient received medication for pain control per MDA guidelines for pain still rates pain 6-7 drowsy and occasionally needing prompts to take a deep breath.  Will reposition left leg with pillows and ice pack in place.   "

## 2024-01-07 NOTE — Op Note (Signed)
 "[facility]  Total Hip Arthroplasty      Patient: Brandon Montoya. MRN: [id number]  SSN: [id number]    Date of Birth: 1965-06-14  Age: 58 y.o.  Sex: male      Date of Procedure: 01/07/2024   Preoperative Diagnosis: Pre-Op Diagnosis Codes:      * Primary osteoarthritis of left hip [M16.12]  Postoperative Diagnosis: Post-Op Diagnosis Codes:     * Primary osteoarthritis of left hip [M16.12]    Procedure: LEFT TOTAL HIP REPLACEMENT ANTERIOR APPROACH WITH C-ARM [facility] POP*: [id number] (CPT)    Surgeon: YANCY ONEIDA STAKES, MD  Assistant(s):  Tanda Sharps, PA-C  Anesthesia: General   Estimated Blood Loss: 500  Specimens: * No specimens in log *   Complications: None  Implants:   Implant Name Type Inv. Item Serial No. Manufacturer Lot No. LRB No. Used Action   HEX DOME HOLE PLUG - [id number]  HEX DOME HOLE PLUG  [employer]WD [id number] Left 1 Implanted   INSERT ACET F 0 DEG 36 MM HIP X3 TRIDENT - [id number]  INSERT ACET F 0 DEG 36 MM HIP X3 TRIDENT  [employer]WD [id number] Left 1 Implanted   SHELL ACET DIA56MM HYDROXY APATITE 5 H CLUS H TRIDENT II - [id number]  SHELL ACET DIA56MM HYDROXY APATITE 5 H CLUS H TRIDENT II  [employer]WD [id number] Left 1 Implanted   STEM FEM STD OFFSET 6 HIP CLLRD INSIGNIA - [id number]  STEM FEM STD OFFSET 6 HIP CLLRD INSIGNIA  [employer]WD [id number] Left 1 Implanted   HEAD FEM DIA36MM -2.5MM OFFSET HIP BIOLOX DELT CERAMIC TAPR - [id number]  HEAD FEM DIA36MM -2.5MM OFFSET HIP BIOLOX DELT CERAMIC TAPR  [employer]WD [id number] Left 1 Implanted   SCREW BNE L25MM DIA6. HEX LO PROF TRIDENT II - [id number]  SCREW BNE L25MM DIA6. HEX LO PROF TRIDENT II  [employer] [id number] Left 1 Implanted       Procedure Detail:  After the patient was brought to the operating suite, He was effectively anesthetized using general anesthesia, then transferred to the Hana table and secured in a standard  fashion. His hip was then prepped and draped in a normal sterile orthopedic fashion. He was given appropriate intravenous antibiotics preoperatively. After a proper timeout was performed, a direct anterior approach to the hip was performed using a short Smith-Petersen interval. Anterior capsulotomy was performed. The degenerative changes of the hip were noted. Femoral neck osteotomy was then performed to the templated area. The head and neck were removed. The pulvinar and labrum were excised. The acetabulum was then reamed up until good bleeding cancellous bone was obtained. The acetabulum was then irrigated with pulse lavage system. The cup was then impacted in place with excellent stable fixation obtained, placing the cup at about 45 degrees of abduction, 20 degrees of anteversion. The liner was then impacted in place. A single screw placed.    Attention was turned to the femur, which was delivered into the wound with a combination of extension, external rotation, and adduction, and using the hook on the Hana table to deliver the femur into the wound. The canal was broached up to the appropriate size for the stem system with excellent stable fixation obtained. A trial reduction was then performed. Leg length and stability were check under flouroscopic imaging. The hip was gently dislocated and trials were removed. The canal was irrigated with the pulse lavage system. The final components were impacted in  place with excellent stable fixation obtained once again. The final reduction was performed and once again leg lengths and offset were verified radiographically. The wound was then irrigated one more time, and then closed in layers. The fascia of the tensor was closed with #1 Vicryl in a running type stitch. Subcutaneous tissue was closed with 2-0 Vicryl in a simple buried fashion. The skin was closed with 3-0 monocryl. Steri-strips and a sterile compressive dressing was applied. The patient tolerated this well,  was transferred to their bed, and taken to recovery room, extubated, in stable condition. All sponge and needle counts were reported as correct.    Signed By: YANCY ONEIDA STAKES, MD     January 07, 2024      "

## 2024-01-07 NOTE — Periop Note (Signed)
"  TRANSFER - IN REPORT:    Verbal report received from Lincoln, RN on Brandon Montoya.  being received from PACU for routine progression of patient care      Report consisted of patient's Situation, Background, Assessment and   Recommendations(SBAR).     Information from the following report(s) Nurse Handoff Report, Surgery Report, Intake/Output, and MAR was reviewed with the receiving nurse.    Opportunity for questions and clarification was provided.      Assessment completed upon patient's arrival to unit and care assumed.     "

## 2024-01-07 NOTE — Periop Note (Signed)
"  Discharge instructions given to patient and caregiver. Written instructions given to take home. Verbal understanding given by patient and caregiver. ID band removed before leaving.    "

## 2024-01-07 NOTE — Discharge Instructions (Addendum)
 Total Hip Arthroplasty Discharge Instructions   Yancy Stakes, M.D.      Please take the time to review the following instructions before you leave the hospital and use them as guidelines during your recovery from surgery. If you have any questions you may contact my office at [phone].     Wound Care / Dressing Changes:     Two days after your surgery date you should remove your dressing. A big, bulky dressing isn't necessary as long as there isn't any drainage from the incisions. If there is drainage you can put a band-aid, primapore, or mepilex dressing over the incision and change it daily until drainage stops. It isn't necessary to apply antibiotic ointment to your incisions. If you have glue over your incision do not peel it off.  If you have steri-strips over your incision they will start to peel off in 7-10 days. They don't need to be removed prior to that. When they begin to peel off you can remove them. They should all be removed by 14 days from you surgery. Keep a towel or gauze in any skin folds that may hang over the incision so that it stays dry.    Showering / Bathing:     You may only shower. You may shower if there is no drainage from your incisions. Your dressing may be removed for showering. You may get your incisions wet in the shower. Don't vigorously scrub the area where your incisions are. Apply a clean, dry dressing after drying off the area of your incisions. Don't take a tub bath, get in a swimming pool or Jacuzzi until the incisions are completely healed. Do not soak your incisions under water .     Weight Bearing Status / Braces:     ___X__ Weight bearing as tolerated. Use crutches, walker, or cane as needed for support. You may move your joints as much as tolerated.     _____  "Toe Touch" weight bearing. Don't bear weight on the leg you had operated on. Use your toes only to steady yourself as you ambulate with crutches or a walker.     _____ Avoid extreme hip extension with  external rotation.     Home Health:    Home health has been arranged for skilled nursing visits and physical therapy.  Your home health has been set up through____________ . If no one from the agency calls you on the day after you arrive home, please contact them at the number provided at discharge. Physical therapy for gait training and strengthening. Continue therapeutic exercises.     Physical Therapy:       Begin In-Home Physical Therapy; 3 times a week to work on gait training, range of motion, strengthening, and weight bearing exercises as tolerable.    Continue to use your walker or cane when walking; may progress from the walker to a cane to complete total bearing as tolerable.    Ice / Elevation:     Continue ice and elevation as needed for pain and swelling.     Diet:     Resume your pre hospital diet. If you have excessive nausea or vomiting call your doctor's office.     Medication:     1. You will be given a prescription for pain medication when you are discharged for the hospital. Take the medication as needed according to the directions on the prescription bottle. Possible side effects of the medication include dizziness, headache, nausea, vomiting, constipation and urinary  retention. If you experience any of these side effects call the office so that we can assist you in relieving them. Discontinue the use of the pain medication if you develop itching, rash, shortness of breath or difficulties swallowing. If these symptoms become severe or aren't relieved by discontinuing the medication you should seek immediate medical attention.     Refills of pain medication are authorized during office hours only. (am - pm Mon. thru Fri.)     You should take one Aspirin  twice daily for four weeks from the date of your surgery, starting 8-10 hours after surgery. This will help to prevent blood clots from forming in your legs.  You may resume the medication you were taking prior to your surgery. Pain medication may  change the effects of any antidepressant medication you are taking. If you have any questions about possible interactions between your regular medications and the pain medication you should consult the physician who prescribes your regular medications.  Please take over the counter stool softeners while you are taking narcotic pain medication. Pain medications can cause constipation. Stool softeners, warm prune juice and increasing your water  and fiber intake can help prevent constipation. Do not take laxatives.    Follow Up Appointment::    Please call [phone] for a follow appointment with Dr. Stevan in 10-14 days from the time of your surgery. Please let our office know you are scheduling a post-op appointment.     Signs and Symptoms to be Aware of:     If any of the following signs and symptoms occur, you should contact Dr. Amey office. Please be advised if problem arises which you feel requires immediate medical attention or you are unable to contact Dr. Amey office, you should seek immediate medical attention at the emergency department or other health care facility you have access to.     Signs and symptoms to watch for include:     1. A sudden increase in swelling and or redness or warmth at the area your surgery was performed which isn't relieved by rest, ice and elevation.   2 Oral temperature greater than 101.5 degrees for 12 hours or more which isn't relieved by an increase in fluid intake and taking two Tylenol  every 4-6 hours.   3 Excessive drainage from your incisions, or drainage which hasn't stopped by 72 hours after your surgery despite applying a compressive dressing, ice and elevation.   4 Calf pain, tenderness, redness or swelling which isn't relieved with rest and elevation.   5 Fever, chills, shortness of breath, chest pain, nausea, vomiting or other signs and symptoms which are of concern to you.     Other Instructions:       DISCHARGE SUMMARY from Nurse    PATIENT  INSTRUCTIONS:    After general anesthesia or intravenous sedation, for 24 hours or while taking prescription Narcotics:  Limit your activities  Do not drive and operate hazardous machinery  Do not make important personal or business decisions  Do  not drink alcoholic beverages  If you have not urinated within 8 hours after discharge, please contact your surgeon on call.    Report the following to your surgeon:  Excessive pain, swelling, redness or odor of or around the surgical area  Temperature over 100.5  Nausea and vomiting lasting longer than 4 hours or if unable to take medications  Any signs of decreased circulation or nerve impairment to extremity: change in color, persistent  numbness, tingling, coldness or  increase pain  Any questions    What to do at Home:  Recommended activity: activity as tolerated and no driving until advised by doctor.     *  Please give a list of your current medications to your Primary Care Provider.    *  Please update this list whenever your medications are discontinued, doses are      changed, or new medications (including over-the-counter products) are added.    *  Please carry medication information at all times in case of emergency situations.    These are general instructions for a healthy lifestyle:    No smoking/ No tobacco products/ Avoid exposure to second hand smoke  Surgeon General's Warning:  Quitting smoking now greatly reduces serious risk to your health.    Obesity, smoking, and sedentary lifestyle greatly increases your risk for illness    A healthy diet, regular physical exercise & weight monitoring are important for maintaining a healthy lifestyle    You may be retaining fluid if you have a history of heart failure or if you experience any of the following symptoms:  Weight gain of 3 pounds or more overnight or 5 pounds in a week, increased swelling in our hands or feet or shortness of breath while lying flat in bed.  Please call your doctor as soon as you notice  any of these symptoms; do not wait until your next office visit.     Patient armband removed and shredded     The discharge information has been reviewed with the patient and caregiver.  The patient and caregiver verbalized understanding.  Discharge medications reviewed with the patient and caregiver and appropriate educational materials and side effects teaching were provided.           Nausea and Vomiting After Surgery: Care Instructions  Your Care Instructions     After you've had surgery, you may feel sick to your stomach (nauseated) or you may vomit. Sometimes anesthesia can make you feel sick. It's a common side effect and often doesn't last long. Pain also can make you feel sick or vomit. After the anesthesia wears off, you may feel pain from the incision (cut). That pain could then upset your stomach. Taking pain medicine can also make you feel sick to your stomach.  Whatever the cause, you may get medicine that can help. There are also some things you can do at home to prevent nausea and feel better.  The doctor has checked you carefully, but problems can develop later. If you notice any problems or new symptoms, get medical treatment right away.  Follow-up care is a key part of your treatment and safety. Be sure to make and go to all appointments, and call your doctor if you are having problems. It's also a good idea to know your test results and keep a list of the medicines you take.  How can you care for yourself at home?  Take your pain medicine as soon as you have pain. It works better if you take it before the pain gets bad.  Call your doctor if you have any problems with your medicine.  To prevent dehydration, drink plenty of fluids. Choose water  and other clear liquids until you feel better. If you have kidney, heart, or liver disease and have to limit fluids, talk with your doctor before you increase the amount of fluids you drink.  When you are able to eat, try clear soups, mild foods, and liquids  until all symptoms  are gone for 12 to 48 hours. Other good choices include dry toast, crackers, cooked cereal, and gelatin dessert, such as Jell-O.  Do not smoke. Smoking and being around smoke can make nausea worse.   When should you call for help?    Call your doctor now or seek immediate medical care if:    You have new or worse nausea or vomiting.     You are too sick to your stomach to drink any fluids.     You cannot keep down fluids.     You have symptoms of dehydration, such as:  Dry eyes and a dry mouth.  Passing only a little urine.  Feeling thirstier than usual.     You are dizzy or lightheaded, or you feel like you may faint.           Infection After Surgery: Care Instructions  Overview  After surgery, an infection is always possible. It doesn't mean that the surgery didn't go well.  How can you care for yourself at home?  Make sure your surgeon knows about the infection, especially if you saw another doctor about your symptoms.  If your doctor prescribed antibiotics, take them as directed. Do not stop taking them just because you feel better. You need to take the full course of antibiotics.  Keep the skin clean and dry.  You may have a bandage over the cut (incision). A bandage helps the incision heal and protects it. Your doctor will tell you how to take care of this. Keep it clean and dry. You may have drainage from the wound.     Call your doctor now or seek immediate medical care if:    You have signs of an infection such as:  Increased pain, swelling, warmth, or redness in the area.  Red streaks leading from the area.  Pus draining from the wound.  A new or higher fever.       Bleeding After Surgery: Care Instructions  Overview  After surgery, it is common to have some minor bruising or bleeding from the cut (incision) made by your doctor. But problems may occur that cause you to bleed too much in the surgery area.  An injury to a blood vessel can cause bleeding after surgery. Other causes include  medicines such as aspirin  or anticoagulants (blood thinners).  To help stop the bleeding, your doctor may have put pressure on the incision or sewn up or cauterized (sealed) the incision. Or you may have had surgery to stop bleeding inside the surgery area. Your doctor also may have given you medicines that help stop the bleeding.  How can you care for yourself at home?  If you have strips of tape on the incision, leave the tape on until it falls off. Or follow your doctor's instructions for removing the tape. Keep the area dry at all times.  You will have a dressing over the incision. A dressing helps the incision heal and protects it. Your doctor will tell you how to take care of this.  If you do not have tape on the incision, wash the area daily with warm, soapy water , and pat it dry. Don't use hydrogen peroxide or alcohol, which can slow healing.    When should you call for help?    Call your doctor now or seek immediate medical care if:    You are dizzy or lightheaded, or you feel like you may faint.     You have bleeding that  starts again or gets worse, such as soaking one or more bandages over 2 to 4 hours, even after holding pressure on the area.         __________________________________________________________________________________________________________________________________

## 2024-01-07 NOTE — Periop Note (Signed)
"  TRANSFER - OUT REPORT:    Verbal report given to [place], RN on Brandon Montoya.  being transferred to Phase 2 for routine post-op       Report consisted of patient's Situation, Background, Assessment and   Recommendations(SBAR).     Information from the following report(s) Nurse Handoff Report was reviewed with the receiving nurse.           Lines:   Peripheral IV 01/07/24 Left;Proximal Forearm (Active)   Site Assessment Clean, dry & intact 01/07/24 0945   Line Status Infusing 01/07/24 0945   Line Care Connections checked and tightened 01/07/24 0612   Phlebitis Assessment No symptoms 01/07/24 0945   Infiltration Assessment 0 01/07/24 0945   Dressing Status Clean, dry & intact 01/07/24 0945   Dressing Type Transparent 01/07/24 0945        Opportunity for questions and clarification was provided.      Patient transported with:  Registered Nurse       "

## 2024-01-07 NOTE — Periop Note (Signed)
"  TRANSFER - IN REPORT:    Verbal report received from ORN & CRNA on Brandon Montoya.  being received from OR  for routine post-op      Report consisted of patient's Situation, Background, Assessment and   Recommendations(SBAR).     Information from the following report(s) Nurse Handoff Report was reviewed with the receiving nurse.    Opportunity for questions and clarification was provided.      Assessment completed upon patient's arrival to unit and care assumed.    "

## 2024-01-07 NOTE — Anesthesia Postprocedure Evaluation (Signed)
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    Nausea/Vomiting: Controlled.    Postoperative hydration reviewed and adequate.    Pain:  Managed    Mental Status and Level of Consciousness: Baseline and appropriate for discharge.    Adequate oxygenation and airway patent.    Complications related to anesthesia: None    Post-anesthesia assessment completed. No concerns.    Patient has met all discharge requirements.    Signed By: Dorn Cleverly, MD    January 07, 2024   "

## 2024-01-07 NOTE — Periop Note (Signed)
"  Using incentive spirometer vital signs with in limits.  "

## 2024-01-13 ENCOUNTER — Encounter: Payer: Medicare (Managed Care) | Attending: Family Medicine | Primary: Family Medicine
